# Patient Record
Sex: Male | Born: 1965 | Race: White | Hispanic: No | Smoking: Former smoker
Health system: Southern US, Community
[De-identification: ages and names within clinical notes are randomized; demographics above are authoritative.]

## PROBLEM LIST (undated history)

## (undated) DIAGNOSIS — N200 Calculus of kidney: Secondary | ICD-10-CM

## (undated) DIAGNOSIS — M109 Gout, unspecified: Secondary | ICD-10-CM

## (undated) DIAGNOSIS — K5792 Diverticulitis of intestine, part unspecified, without perforation or abscess without bleeding: Secondary | ICD-10-CM

## (undated) DIAGNOSIS — I2510 Atherosclerotic heart disease of native coronary artery without angina pectoris: Secondary | ICD-10-CM

## (undated) DIAGNOSIS — F419 Anxiety disorder, unspecified: Secondary | ICD-10-CM

## (undated) DIAGNOSIS — K922 Gastrointestinal hemorrhage, unspecified: Secondary | ICD-10-CM

## (undated) DIAGNOSIS — I10 Essential (primary) hypertension: Secondary | ICD-10-CM

## (undated) DIAGNOSIS — I219 Acute myocardial infarction, unspecified: Secondary | ICD-10-CM

## (undated) DIAGNOSIS — I429 Cardiomyopathy, unspecified: Secondary | ICD-10-CM

## (undated) DIAGNOSIS — D509 Iron deficiency anemia, unspecified: Secondary | ICD-10-CM

## (undated) DIAGNOSIS — E119 Type 2 diabetes mellitus without complications: Secondary | ICD-10-CM

## (undated) DIAGNOSIS — G629 Polyneuropathy, unspecified: Secondary | ICD-10-CM

## (undated) DIAGNOSIS — E785 Hyperlipidemia, unspecified: Secondary | ICD-10-CM

## (undated) HISTORY — PX: CARDIAC CATHETERIZATION: SHX172

## (undated) HISTORY — PX: KNEE ARTHROSCOPY: SHX127

## (undated) HISTORY — DX: Polyneuropathy, unspecified: G62.9

## (undated) HISTORY — PX: CYSTOSCOPY/RETROGRADE/URETEROSCOPY/STONE EXTRACTION WITH BASKET: SHX5317

## (undated) HISTORY — PX: TONSILLECTOMY: SUR1361

## (undated) HISTORY — PX: KNEE SURGERY: SHX244

---

## 2005-12-02 DIAGNOSIS — I219 Acute myocardial infarction, unspecified: Secondary | ICD-10-CM

## 2005-12-02 HISTORY — PX: CORONARY ARTERY BYPASS GRAFT: SHX141

## 2005-12-02 HISTORY — DX: Acute myocardial infarction, unspecified: I21.9

## 2007-12-03 HISTORY — PX: KNEE CARTILAGE SURGERY: SHX688

## 2011-12-03 DIAGNOSIS — K922 Gastrointestinal hemorrhage, unspecified: Secondary | ICD-10-CM

## 2011-12-03 DIAGNOSIS — E119 Type 2 diabetes mellitus without complications: Secondary | ICD-10-CM

## 2011-12-03 HISTORY — DX: Type 2 diabetes mellitus without complications: E11.9

## 2011-12-03 HISTORY — DX: Gastrointestinal hemorrhage, unspecified: K92.2

## 2012-05-20 DIAGNOSIS — Z951 Presence of aortocoronary bypass graft: Secondary | ICD-10-CM | POA: Insufficient documentation

## 2012-05-20 DIAGNOSIS — I10 Essential (primary) hypertension: Secondary | ICD-10-CM | POA: Insufficient documentation

## 2012-05-20 DIAGNOSIS — M109 Gout, unspecified: Secondary | ICD-10-CM | POA: Insufficient documentation

## 2012-05-20 DIAGNOSIS — I2510 Atherosclerotic heart disease of native coronary artery without angina pectoris: Secondary | ICD-10-CM | POA: Insufficient documentation

## 2012-05-21 ENCOUNTER — Encounter (HOSPITAL_COMMUNITY): Payer: Self-pay | Admitting: Family Medicine

## 2012-05-21 ENCOUNTER — Emergency Department (HOSPITAL_COMMUNITY)
Admission: EM | Admit: 2012-05-21 | Discharge: 2012-05-21 | Disposition: A | Discharge status: Home / Self Care | Payer: Self-pay | Attending: Emergency Medicine | Admitting: Emergency Medicine

## 2012-05-21 DIAGNOSIS — I10 Essential (primary) hypertension: Secondary | ICD-10-CM

## 2012-05-21 DIAGNOSIS — M109 Gout, unspecified: Secondary | ICD-10-CM

## 2012-05-21 HISTORY — DX: Atherosclerotic heart disease of native coronary artery without angina pectoris: I25.10

## 2012-05-21 LAB — POCT I-STAT, CHEM 8
BUN: 21 mg/dL (ref 6–23)
Calcium, Ion: 1.13 mmol/L (ref 1.12–1.32)
Chloride: 107 mEq/L (ref 96–112)
HCT: 47 % (ref 39.0–52.0)
Sodium: 139 mEq/L (ref 135–145)

## 2012-05-21 MED ORDER — HYDROCODONE-ACETAMINOPHEN 5-325 MG PO TABS: 1.0000 | ORAL_TABLET | Freq: Four times a day (QID) | ORAL | Status: DC | PRN

## 2012-05-21 MED ORDER — HYDROMORPHONE HCL PF 1 MG/ML IJ SOLN
1.0000 mg | Freq: Once | INTRAMUSCULAR | Status: AC
  Administered 2012-05-21: 1 mg via INTRAVENOUS
  Filled 2012-05-21: qty 1

## 2012-05-21 MED ORDER — ONDANSETRON HCL 4 MG/2ML IJ SOLN
4.0000 mg | Freq: Once | INTRAMUSCULAR | Status: AC
  Administered 2012-05-21: 4 mg via INTRAVENOUS
  Filled 2012-05-21: qty 2

## 2012-05-21 MED ORDER — INDOMETHACIN 25 MG PO CAPS
25.0000 mg | ORAL_CAPSULE | Freq: Three times a day (TID) | ORAL | Status: DC | PRN
Start: 2012-05-21 — End: 2012-08-19

## 2012-05-21 MED ORDER — LISINOPRIL 20 MG PO TABS
20.0000 mg | ORAL_TABLET | Freq: Every day | ORAL | Status: DC
  Administered 2012-05-21: 20 mg via ORAL
  Filled 2012-05-21: qty 1

## 2012-05-21 NOTE — Discharge Instructions (Signed)
Arterial Hypertension Arterial hypertension (high blood pressure) is a condition of elevated pressure in your blood vessels. Hypertension over a long period of time is a risk factor for strokes, heart attacks, and heart failure. It is also the leading cause of kidney (renal) failure.  CAUSES   In Adults -- Over 90% of all hypertension has no known cause. This is called essential or primary hypertension. In the other 10% of people with hypertension, the increase in blood pressure is caused by another disorder. This is called secondary hypertension. Important causes of secondary hypertension are:   Heavy alcohol use.   Obstructive sleep apnea.   Hyperaldosterosim (Conn's syndrome).   Steroid use.   Chronic kidney failure.   Hyperparathyroidism.   Medications.   Renal artery stenosis.   Pheochromocytoma.   Cushing's disease.   Coarctation of the aorta.   Scleroderma renal crisis.   Licorice (in excessive amounts).   Drugs (cocaine, methamphetamine).  Your caregiver can explain any items above that apply to you.  In Children -- Secondary hypertension is more common and should always be considered.   Pregnancy -- Few women of childbearing age have high blood pressure. However, up to 10% of them develop hypertension of pregnancy. Generally, this will not harm the woman. It Even be a sign of 3 complications of pregnancy: preeclampsia, HELLP syndrome, and eclampsia. Follow up and control with medication is necessary.  SYMPTOMS   This condition normally does not produce any noticeable symptoms. It is usually found during a routine exam.   Malignant hypertension is a late problem of high blood pressure. It Monger have the following symptoms:   Headaches.   Blurred vision.   End-organ damage (this means your kidneys, heart, lungs, and other organs are being damaged).   Stressful situations can increase the blood pressure. If a person with normal blood pressure has their blood  pressure go up while being seen by their caregiver, this is often termed "white coat hypertension." Its importance is not known. It Alkire be related with eventually developing hypertension or complications of hypertension.   Hypertension is often confused with mental tension, stress, and anxiety.  DIAGNOSIS  The diagnosis is made by 3 separate blood pressure measurements. They are taken at least 1 week apart from each other. If there is organ damage from hypertension, the diagnosis Lemire be made without repeat measurements. Hypertension is usually identified by having blood pressure readings:  Above 140/90 mmHg measured in both arms, at 3 separate times, over a couple weeks.   Over 130/80 mmHg should be considered a risk factor and Grisanti require treatment in patients with diabetes.  Blood pressure readings over 120/80 mmHg are called "pre-hypertension" even in non-diabetic patients. To get a true blood pressure measurement, use the following guidelines. Be aware of the factors that can alter blood pressure readings.  Take measurements at least 1 hour after caffeine.   Take measurements 30 minutes after smoking and without any stress. This is another reason to quit smoking - it raises your blood pressure.   Use a proper cuff size. Ask your caregiver if you are not sure about your cuff size.   Most home blood pressure cuffs are automatic. They will measure systolic and diastolic pressures. The systolic pressure is the pressure reading at the start of sounds. Diastolic pressure is the pressure at which the sounds disappear. If you are elderly, measure pressures in multiple postures. Try sitting, lying or standing.   Sit at rest for a minimum of   5 minutes before taking measurements.   You should not be on any medications like decongestants. These are found in many cold medications.   Record your blood pressure readings and review them with your caregiver.  If you have hypertension:  Your caregiver  may do tests to be sure you do not have secondary hypertension (see "causes" above).   Your caregiver may also look for signs of metabolic syndrome. This is also called Syndrome X or Insulin Resistance Syndrome. You may have this syndrome if you have type 2 diabetes, abdominal obesity, and abnormal blood lipids in addition to hypertension.   Your caregiver will take your medical and family history and perform a physical exam.   Diagnostic tests may include blood tests (for glucose, cholesterol, potassium, and kidney function), a urinalysis, or an EKG. Other tests may also be necessary depending on your condition.  PREVENTION  There are important lifestyle issues that you can adopt to reduce your chance of developing hypertension:  Maintain a normal weight.   Limit the amount of salt (sodium) in your diet.   Exercise often.   Limit alcohol intake.   Get enough potassium in your diet. Discuss specific advice with your caregiver.   Follow a DASH diet (dietary approaches to stop hypertension). This diet is rich in fruits, vegetables, and low-fat dairy products, and avoids certain fats.  PROGNOSIS  Essential hypertension cannot be cured. Lifestyle changes and medical treatment can lower blood pressure and reduce complications. The prognosis of secondary hypertension depends on the underlying cause. Many people whose hypertension is controlled with medicine or lifestyle changes can live a normal, healthy life.  RISKS AND COMPLICATIONS  While high blood pressure alone is not an illness, it often requires treatment due to its short- and long-term effects on many organs. Hypertension increases your risk for:  CVAs or strokes (cerebrovascular accident).   Heart failure due to chronically high blood pressure (hypertensive cardiomyopathy).   Heart attack (myocardial infarction).   Damage to the retina (hypertensive retinopathy).   Kidney failure (hypertensive nephropathy).  Your caregiver can  explain list items above that apply to you. Treatment of hypertension can significantly reduce the risk of complications. TREATMENT   For overweight patients, weight loss and regular exercise are recommended. Physical fitness lowers blood pressure.   Mild hypertension is usually treated with diet and exercise. A diet rich in fruits and vegetables, fat-free dairy products, and foods low in fat and salt (sodium) can help lower blood pressure. Decreasing salt intake decreases blood pressure in a 1/3 of people.   Stop smoking if you are a smoker.  The steps above are highly effective in reducing blood pressure. While these actions are easy to suggest, they are difficult to achieve. Most patients with moderate or severe hypertension end up requiring medications to bring their blood pressure down to a normal level. There are several classes of medications for treatment. Blood pressure pills (antihypertensives) will lower blood pressure by their different actions. Lowering the blood pressure by 10 mmHg may decrease the risk of complications by as much as 25%. The goal of treatment is effective blood pressure control. This will reduce your risk for complications. Your caregiver will help you determine the best treatment for you according to your lifestyle. What is excellent treatment for one person, may not be for you. HOME CARE INSTRUCTIONS   Do not smoke.   Follow the lifestyle changes outlined in the "Prevention" section.   If you are on medications, follow the directions   carefully. Blood pressure medications must be taken as prescribed. Skipping doses reduces their benefit. It also puts you at risk for problems.   Follow up with your caregiver, as directed.   If you are asked to monitor your blood pressure at home, follow the guidelines in the "Diagnosis" section above.  SEEK MEDICAL CARE IF:   You think you are having medication side effects.   You have recurrent headaches or lightheadedness.     You have swelling in your ankles.   You have trouble with your vision.  SEEK IMMEDIATE MEDICAL CARE IF:   You have sudden onset of chest pain or pressure, difficulty breathing, or other symptoms of a heart attack.   You have a severe headache.   You have symptoms of a stroke (such as sudden weakness, difficulty speaking, difficulty walking).  MAKE SURE YOU:   Understand these instructions.   Will watch your condition.   Will get help right away if you are not doing well or get worse.  Document Released: 11/18/2005 Document Revised: 11/07/2011 Document Reviewed: 06/18/2007 [facility] Patient Information 2012 [place], [place]. Gout Gout is an inflammatory condition (arthritis) caused by a buildup of uric acid crystals in the joints. Uric acid is a chemical that is normally present in the blood. Under some circumstances, uric acid can form into crystals in your joints. This causes joint redness, soreness, and swelling (inflammation). Repeat attacks are common. Over time, uric acid crystals can form into masses (tophi) near a joint, causing disfigurement. Gout is treatable and often preventable. CAUSES  The disease begins with elevated levels of uric acid in the blood. Uric acid is produced by your body when it breaks down a naturally found substance called purines. This also happens when you eat certain foods such as meats and fish. Causes of an elevated uric acid level include:  Being passed down from parent to child (heredity).   Diseases that cause increased uric acid production (obesity, psoriasis, some cancers).   Excessive alcohol use.   Diet, especially diets rich in meat and seafood.   Medicines, including certain cancer-fighting drugs (chemotherapy), diuretics, and aspirin.   Chronic kidney disease. The kidneys are no longer able to remove uric acid well.   Problems with metabolism.  Conditions strongly associated with gout include:  Obesity.   High blood pressure.    High cholesterol.   Diabetes.  Not everyone with elevated uric acid levels gets gout. It is not understood why some people get gout and others do not. Surgery, joint injury, and eating too much of certain foods are some of the factors that can lead to gout. SYMPTOMS   An attack of gout comes on quickly. It causes intense pain with redness, swelling, and warmth in a joint.   Fever can occur.   Often, only one joint is involved. Certain joints are more commonly involved:   Base of the big toe.   Knee.   Ankle.   Wrist.   Finger.  Without treatment, an attack usually goes away in a few days to weeks. Between attacks, you usually will not have symptoms, which is different from many other forms of arthritis. DIAGNOSIS  Your caregiver will suspect gout based on your symptoms and exam. Removal of fluid from the joint (arthrocentesis) is done to check for uric acid crystals. Your caregiver will give you a medicine that numbs the area (local anesthetic) and use a needle to remove joint fluid for exam. Gout is confirmed when uric acid crystals are  seen in joint fluid, using a special microscope. Sometimes, blood, urine, and X-ray tests are also used. TREATMENT  There are 2 phases to gout treatment: treating the sudden onset (acute) attack and preventing attacks (prophylaxis). Treatment of an Acute Attack  Medicines are used. These include anti-inflammatory medicines or steroid medicines.   An injection of steroid medicine into the affected joint is sometimes necessary.   The painful joint is rested. Movement can worsen the arthritis.   You may use warm or cold treatments on painful joints, depending which works best for you.   Discuss the use of coffee, vitamin C, or cherries with your caregiver. These may be helpful treatment options.  Treatment to Prevent Attacks After the acute attack subsides, your caregiver may advise prophylactic medicine. These medicines either help your  kidneys eliminate uric acid from your body or decrease your uric acid production. You may need to stay on these medicines for a very long time. The early phase of treatment with prophylactic medicine can be associated with an increase in acute gout attacks. For this reason, during the first few months of treatment, your caregiver may also advise you to take medicines usually used for acute gout treatment. Be sure you understand your caregiver's directions. You should also discuss dietary treatment with your caregiver. Certain foods such as meats and fish can increase uric acid levels. Other foods such as dairy can decrease levels. Your caregiver can give you a list of foods to avoid. HOME CARE INSTRUCTIONS   Do not take aspirin to relieve pain. This raises uric acid levels.   Only take over-the-counter or prescription medicines for pain, discomfort, or fever as directed by your caregiver.   Rest the joint as much as possible. When in bed, keep sheets and blankets off painful areas.   Keep the affected joint raised (elevated).   Use crutches if the painful joint is in your leg.   Drink enough water and fluids to keep your urine clear or pale yellow. This helps your body get rid of uric acid. Do not drink alcoholic beverages. They slow the passage of uric acid.   Follow your caregiver's dietary instructions. Pay careful attention to the amount of protein you eat. Your daily diet should emphasize fruits, vegetables, whole grains, and fat-free or low-fat milk products.   Maintain a healthy body weight.  SEEK MEDICAL CARE IF:   You have an oral temperature above 102 F (38.9 C).   You develop diarrhea, vomiting, or any side effects from medicines.   You do not feel better in 24 hours, or you are getting worse.  SEEK IMMEDIATE MEDICAL CARE IF:   Your joint becomes suddenly more tender and you have:   Chills.   An oral temperature above 102 F (38.9 C), not controlled by medicine.  MAKE  SURE YOU:   Understand these instructions.   Will watch your condition.   Will get help right away if you are not doing well or get worse.  Document Released: 11/15/2000 Document Revised: 11/07/2011 Document Reviewed: 02/26/2010 RESOURCE GUIDE  Chronic Pain Problems: Contact [facility]  [phone] Patients need to be referred by their primary care doctor.  Insufficient Money for Medicine: Contact United Way:  call "211" or [facility] [phone].  No Primary Care Doctor: - Call [facility]  [phone] - can help you locate a primary care doctor that  accepts your insurance, provides certain services, etc. - Physician Referral [phone]  Agencies that  provide inexpensive medical care: [facility]  [phone] - [facility]  [phone] - [facility]  [phone] [facility]  [phone] - [facility]  [phone] [facility]  [phone]  Medicaid-accepting [facility] Providers: - [facility]- [address]  [phone], Mon-Fri 9am-7pm, Sat 9am-1pm - [facility]- [address] [place], [address] [place]  [phone] - [facility]- [address] [place]  [phone] [facility]- [address]  [phone] - Renaye Rakers- [address] [place], [address], [phone]  Only accepts [place] [facility] [place] patients after they have their name  applied to their card  Self Pay (no insurance) in [facility]: - Sickle Cell Patients: Dr Willey Blade, [facility]  [address] [place], [phone] - [facility]- [address] [place]  [phone]       [facility] [place]- [address]       -     [facility]- see information above (Speak to [employer] if you do not have insurance)       -  [facility]- [address] [place], [phone]        -  [facility]- [address],  [phone]       -  [facility]- [address], [phone]       -  Dr Julio Sicks-  [address], [place], [phone]       -  [facility]- [address] [phone]       -  [facility]- [address] [phone], also [address], [phone]       -    [facility]- [address] [place], [phone], 1st & 3rd Saturday   every month, 10am-1pm  1) Find a Doctor and Pay Out of Pocket Although you won't have to find out who is covered by your insurance plan, it is a good idea to ask around and get recommendations. You will then need to call the office and see if the doctor you have chosen will accept you as a new patient and what types of options they offer for patients who are self-pay. Some doctors offer discounts or will set up payment plans for their patients who do not have insurance, but you will need to ask so you aren't surprised when you get to your appointment.  2) Contact Your Local Health Department Not all health departments have doctors that can see patients for sick visits, but many do, so it is worth a call to see if yours does. If you don't know where your local health department is, you can check in your phone book. The CDC also has a tool to help you locate your state's health department, and many state websites also have listings of all of their local health departments.  3) Find a Walk-in Clinic If your illness is not likely to be very severe or complicated, you may want to try a walk in clinic. These are popping up all over the country in pharmacies, drugstores, and shopping centers. They're usually staffed by nurse practitioners or physician assistants that have been trained to treat common illnesses and complaints. They're usually fairly quick and inexpensive. However, if you  have serious medical issues or chronic medical problems, these are probably not your  best option  STD Testing - [facility] Department of [facility], STD Clinic, [address] [place], phone [phone] or [phone].  Monday - Friday, call for an appointment. [facility] Department of [employer], STD Clinic, [address], [place], phone [phone] or [phone].  Monday - Friday, call for an appointment.  Abuse/Neglect: [facility] Child Abuse Hotline (480) 670-8794 [facility] Child Abuse Hotline 404-321-9664 (After Hours)  Emergency Shelter:  [facility] 912-300-2370  Maternity Homes: - [facility] at the [place] [facility] [phone] - [facility] (631)283-0367  MRSA Hotline #:   (410)063-6902  [facility] Resources  [facility] [place]  United Way [facility] Dept. [address]                 [address]         [address] [place] [address]  Blondell Reveal Phone:  [phone]                                  Phone:  [phone]                   Phone:  [phone]  [facility] Mental Health, [phone] - [phone]       -     [facility] in [place], [address],                                  [phone], [facility] Child Abuse Hotline 772-177-7376 or 9867650507 (After Hours)   Behavioral Health Services  Substance Abuse Resources: - [facility] Drug Services  [phone] - [facility] (909) 302-6058 - [facility] [place] (240)767-1520 Floydene Flock [phone] - Residential & Outpatient Substance Abuse Program  (801) [phone]  Psychological Services: [facility]  662-617-3921 Services  [phone] - [facility], (870) [phone] [place]. [address] [place], ACCESS LINE: 708-686-7099 or  (915)[phone], [employer]za  Dental Assistance  If unable to pay or uninsured, contact:  [facility] or [facility]. to become qualified for the adult dental clinic.  Patients with Medicaid: [facility] [phone] [address], [phone] [address] [phone]  If unable to pay, or uninsured, contact [facility] 228-001-5980) or [facility] Department [phone] in [place], [phone] in [facility]) to become qualified for the adult dental clinic  Other Low-Cost Community Dental Services: - [facility]- [address] [place], [place], [place], [address], [phone], [address]. [address], 2nd and 4th Thursday of the month at 6:30am.  10 clients each day by appointment, can sometimes see walk-in patients if someone does not show for an appointment. -  [facility]- [address] Ether Griffins [place], [place], [address], [phone] - [facility]- [address], [place], [place], [address], [phone] [facility]- [phone] [facility] Department- [phone] [facility] Health Department- [phone] [facility] Patient Information [address] [place].

## 2012-05-21 NOTE — ED Provider Notes (Signed)
History     CSN: [id number]  Arrival date & time 05/20/12  2329   First MD Initiated Contact with Patient 05/21/12 0211      Chief Complaint  Patient presents with  . Gout    HPI The patient presents to the emergency room with complaints of right foot pain that he associates with a gout attack. Patient states she's had this trouble in the past. He's been previously diagnosed with gout. Over the last few days he has had the onset of pain in his right foot primarily along the metatarsal phalangeal joint of the great toe as well as the adjacent toes. He has not had any fevers or trauma.  He has not had any numbness or weakness. He has not noticed any rash. Patient states when the pain is severe it is caused him to be nauseous and vomit. He states he cannot take his blood pressure medications today for that reason.  He denies any abdominal pain, chest pain or shortness of breath. Past Medical History  Diagnosis Date  . Coronary artery disease     Past Surgical History  Procedure Date  . Coronary artery bypass graft   . Knee surgery   . Renal exploration     No family history on file.  History  Substance Use Topics  . Smoking status: Former Games developer  . Smokeless tobacco: Not on file  . Alcohol Use: No      Review of Systems  All other systems reviewed and are negative.    Allergies  Review of patient's allergies indicates no known allergies.  Home Medications   Current Outpatient Rx  Name Route Sig Dispense Refill  . GLUCOSAMINE-CHONDROITIN 500-400 MG PO TABS Oral Take 1 tablet by mouth daily.    Marland Kitchen LISINOPRIL 20 MG PO TABS Oral Take 20 mg by mouth daily.    Marland Kitchen MELATONIN 3 MG PO CAPS Oral Take 1 capsule by mouth at bedtime as needed. For sleep    . ROSUVASTATIN CALCIUM 10 MG PO TABS Oral Take 10 mg by mouth daily.      BP 188/130  Pulse 99  Temp 98.3 F (36.8 C) (Oral)  Resp 22  SpO2 99%  Physical Exam  Nursing note and vitals reviewed. Constitutional: He  appears well-developed and well-nourished. No distress.  HENT:  Head: Normocephalic and atraumatic.  Right Ear: External ear normal.  Left Ear: External ear normal.  Eyes: Conjunctivae are normal. Right eye exhibits no discharge. Left eye exhibits no discharge. No scleral icterus.  Neck: Neck supple. No tracheal deviation present.  Cardiovascular: Normal rate, regular rhythm and intact distal pulses.   Pulmonary/Chest: Effort normal and breath sounds normal. No stridor. No respiratory distress. He has no wheezes. He has no rales.  Abdominal: Soft. Bowel sounds are normal. He exhibits no distension. There is no tenderness. There is no rebound and no guarding.  Musculoskeletal: He exhibits tenderness. He exhibits no edema.       Right foot: He exhibits decreased range of motion and tenderness. He exhibits no swelling, normal capillary refill, no crepitus and no deformity.       Feet:       Normal dorsalis pedis pulse in the right foot  Neurological: He is alert. He has normal strength. No sensory deficit. Cranial nerve deficit:  no gross defecits noted. He exhibits normal muscle tone. He displays no seizure activity. Coordination normal.  Skin: Skin is warm and dry. No rash noted.  Psychiatric: He has a  normal mood and affect.    ED Course  Procedures (including critical care time)  Labs Reviewed  POCT I-STAT, CHEM 8 - Abnormal; Notable for the following:    Glucose, Bld 169 (*)     All other components within normal limits   No results found.   1. Gout   2. Hypertension       MDM  Patient's blood pressure at the bedside was 140/98. It is improved after taking his blood pressure medications and getting some pain relief.  Symptoms are consistent with a gout attack.  No sign of acute infection or vascular insufficiency.  Pt will be discharged home on pain medications and indocin which he has taken in the past with good results.        Celene Kras, MD 05/21/12 [phone]

## 2012-05-21 NOTE — ED Notes (Addendum)
Patient states "I think I have gout." States he has right foot pain since Sunday that has gotten worse. States pain is so bad that he has vomited. Has not been able to keep blood pressure meds down.

## 2012-07-18 ENCOUNTER — Encounter (HOSPITAL_COMMUNITY): Payer: Self-pay | Admitting: Emergency Medicine

## 2012-07-18 ENCOUNTER — Emergency Department (HOSPITAL_COMMUNITY)
Admission: EM | Admit: 2012-07-18 | Discharge: 2012-07-18 | Disposition: A | Discharge status: Home / Self Care | Payer: Self-pay | Attending: Emergency Medicine | Admitting: Emergency Medicine

## 2012-07-18 DIAGNOSIS — Z951 Presence of aortocoronary bypass graft: Secondary | ICD-10-CM | POA: Insufficient documentation

## 2012-07-18 DIAGNOSIS — E119 Type 2 diabetes mellitus without complications: Secondary | ICD-10-CM | POA: Insufficient documentation

## 2012-07-18 DIAGNOSIS — I10 Essential (primary) hypertension: Secondary | ICD-10-CM | POA: Insufficient documentation

## 2012-07-18 DIAGNOSIS — C000 Malignant neoplasm of external upper lip: Secondary | ICD-10-CM | POA: Insufficient documentation

## 2012-07-18 DIAGNOSIS — M109 Gout, unspecified: Secondary | ICD-10-CM | POA: Insufficient documentation

## 2012-07-18 HISTORY — DX: Essential (primary) hypertension: I10

## 2012-07-18 HISTORY — DX: Gout, unspecified: M10.9

## 2012-07-18 HISTORY — DX: Diverticulitis of intestine, part unspecified, without perforation or abscess without bleeding: K57.92

## 2012-07-18 LAB — POCT I-STAT TROPONIN I: Troponin i, poc: 0.04 ng/mL (ref 0.00–0.08)

## 2012-07-18 LAB — POCT I-STAT, CHEM 8
BUN: 15 mg/dL (ref 6–23)
Creatinine, Ser: 1.1 mg/dL (ref 0.50–1.35)
Hemoglobin: 17 g/dL (ref 13.0–17.0)
Potassium: 3.8 mEq/L (ref 3.5–5.1)
Sodium: 141 mEq/L (ref 135–145)
TCO2: 21 mmol/L (ref 0–100)

## 2012-07-18 LAB — GLUCOSE, CAPILLARY: Glucose-Capillary: 152 mg/dL — ABNORMAL HIGH (ref 70–99)

## 2012-07-18 MED ORDER — MORPHINE SULFATE 4 MG/ML IJ SOLN
2.0000 mg | Freq: Once | INTRAMUSCULAR | Status: AC
  Administered 2012-07-18: 2 mg via INTRAVENOUS
  Filled 2012-07-18: qty 1

## 2012-07-18 MED ORDER — ONDANSETRON HCL 4 MG/2ML IJ SOLN
4.0000 mg | Freq: Once | INTRAMUSCULAR | Status: AC
  Administered 2012-07-18: 4 mg via INTRAVENOUS
  Filled 2012-07-18: qty 2

## 2012-07-18 MED ORDER — HYDROCODONE-IBUPROFEN 7.5-200 MG PO TABS: 1.0000 | ORAL_TABLET | Freq: Four times a day (QID) | ORAL | Status: AC | PRN

## 2012-07-18 MED ORDER — PREDNISONE 20 MG PO TABS: 40.0000 mg | ORAL_TABLET | Freq: Every day | ORAL | Status: AC

## 2012-07-18 MED ORDER — SODIUM CHLORIDE 0.9 % IV BOLUS (SEPSIS)
500.0000 mL | INTRAVENOUS | Status: AC
  Administered 2012-07-18: 500 mL via INTRAVENOUS

## 2012-07-18 MED ORDER — COLCHICINE 0.6 MG PO TABS: 0.6000 mg | ORAL_TABLET | Freq: Two times a day (BID) | ORAL | Status: DC

## 2012-07-18 MED ORDER — MORPHINE SULFATE 4 MG/ML IJ SOLN
4.0000 mg | Freq: Once | INTRAMUSCULAR | Status: AC
  Administered 2012-07-18: 4 mg via INTRAVENOUS
  Filled 2012-07-18: qty 1

## 2012-07-18 MED ORDER — LABETALOL HCL 5 MG/ML IV SOLN
20.0000 mg | Freq: Once | INTRAVENOUS | Status: AC
  Administered 2012-07-18: 20 mg via INTRAVENOUS
  Filled 2012-07-18: qty 4

## 2012-07-18 NOTE — ED Notes (Signed)
5mg labetalol given

## 2012-07-18 NOTE — ED Notes (Signed)
Pt presents w/ painful left foot onset 10 days ago. Saw PCP 10 days ago Rx'ed w/ Prednisone and Allopurinol which he has not started. Swelling and pain is getting worse. Endorses hx of gout in right foot but not usually the left.

## 2012-07-18 NOTE — ED Provider Notes (Signed)
History     CSN: [id number]  Arrival date & time 07/18/12  1324   First MD Initiated Contact with Patient 07/18/12 1439      Chief Complaint  Patient presents with  . Foot Pain    (Consider location/radiation/quality/duration/timing/severity/associated sxs/prior treatment) The history is provided by the patient and medical records.   Jose Petersen is a 46 y.o. male presents to the emergency department complaining of L foot pain.  The onset of the symptoms was  gradual starting 12 days ago.  The patient has associated nausea 2/2 to the pain.  The symptoms have been  persistent, gradually worsened.  walking makes the symptoms worse and vicoden makes symptoms better.  The patient denies fever, chills, headache, rash, chest pain, shortness of breath, abdominal pain, lesions on the foot or legs, trauma to the foot/ankle.   He believes these symptoms are related to a gout attack.  He was seen by Dr Sharol Roussel in [place] 10 days ago and given a prednisone dose pack, vicoden and allopurinol.  States the swelling and pain are continuing and he is struggling with work.  He has a Hx of gout.   The patient has medical history significant for:  Past Medical History  Diagnosis Date  . Coronary artery disease   . Hypertension   . Gout   . Diverticulitis   . Diabetes mellitus   . Kidney calculus     Past Surgical History  Procedure Date  . Coronary artery bypass graft   . Knee surgery   . Renal exploration     No family history on file.  History  Substance Use Topics  . Smoking status: Former Games developer  . Smokeless tobacco: Never Used  . Alcohol Use: No     Review of Systems  Constitutional: Positive for appetite change (2/2 pain and nausea). Negative for fever and fatigue.  Respiratory: Negative for chest tightness, shortness of breath and wheezing.   Cardiovascular: Negative for chest pain.  Gastrointestinal: Positive for nausea. Negative for vomiting, abdominal pain, diarrhea and  constipation.  Genitourinary: Negative for dysuria, urgency, frequency and hematuria.  Musculoskeletal: Positive for joint swelling and gait problem (2/2 pain). Negative for back pain.  Skin: Negative for rash and wound.  Neurological: Negative for headaches.    Allergies  Review of patient's allergies indicates no known allergies.  Home Medications   Current Outpatient Rx  Name Route Sig Dispense Refill  . GLUCOSAMINE-CHONDROITIN 500-400 MG PO TABS Oral Take 1 tablet by mouth daily.    Marland Kitchen LISINOPRIL 20 MG PO TABS Oral Take 20 mg by mouth daily.    Marland Kitchen MELATONIN 3 MG PO CAPS Oral Take 1 capsule by mouth at bedtime as needed. For sleep    . ROSUVASTATIN CALCIUM 10 MG PO TABS Oral Take 10 mg by mouth daily.      BP 194/130  Pulse 88  Temp 98 F (36.7 C) (Oral)  Resp 22  Ht 5\' 9"  (1.753 m)  Wt 238 lb (107.956 kg)  BMI 35.15 kg/m2  SpO2 97%  Physical Exam  Nursing note and vitals reviewed. Constitutional: He appears well-developed and well-nourished. No distress.  HENT:  Head: Normocephalic and atraumatic.  Eyes: Conjunctivae are normal.  Neck: Normal range of motion.  Cardiovascular: Normal rate, regular rhythm, normal heart sounds and intact distal pulses.  Exam reveals no gallop and no friction rub.   No murmur heard.      Capillary refill < 3 sec   Pulmonary/Chest: Effort normal  and breath sounds normal. No respiratory distress. He has no wheezes.  Abdominal: Soft. Bowel sounds are normal. He exhibits no distension. There is no tenderness. There is no rebound.  Musculoskeletal: Normal range of motion. He exhibits tenderness (L ankle, great toe and 2nd toe). He exhibits no edema.       Left ankle: He exhibits swelling. He exhibits no ecchymosis, no deformity, no laceration and normal pulse. Decreased range of motion: 2/2 pain. tenderness.       Feet:       ROM: full active and passive ROM Gait disturbance 2/2 pain in ankle  Neurological: He is alert. Coordination normal.         Sensation normal Strength decreased 2/2 pain  Skin: Skin is warm and dry. He is not diaphoretic.    ED Course  Procedures (including critical care time)  Labs Reviewed  POCT I-STAT, CHEM 8 - Abnormal; Notable for the following:    Glucose, Bld 185 (*)     All other components within normal limits  GLUCOSE, CAPILLARY - Abnormal; Notable for the following:    Glucose-Capillary 152 (*)     All other components within normal limits   No results found. Results for orders placed during the hospital encounter of 07/18/12  POCT I-STAT, CHEM 8      Component Value Range   Sodium 141  135 - 145 mEq/L   Potassium 3.8  3.5 - 5.1 mEq/L   Chloride 107  96 - 112 mEq/L   BUN 15  6 - 23 mg/dL   Creatinine, Ser 2.44  0.50 - 1.35 mg/dL   Glucose, Bld 010 (*) 70 - 99 mg/dL   Calcium, Ion 2.72  5.36 - 1.23 mmol/L   TCO2 21  0 - 100 mmol/L   Hemoglobin 17.0  13.0 - 17.0 g/dL   HCT 64.4  03.4 - 74.2 %  GLUCOSE, CAPILLARY      Component Value Range   Glucose-Capillary 152 (*) 70 - 99 mg/dL   No results found.  ECG:  Date: 07/18/2012  Rate: 93   Rhythm: normal sinus rhythm  QRS Axis: normal  Intervals: QT prolonged  ST/T Wave abnormalities: normal  Conduction Disutrbances:none  Narrative Interpretation: sinus rhythm with slight QT prolongation, no old ECG  Old EKG Reviewed: none available   1. HTN (hypertension)   2. Gout attack     MDM  [name] presents with L ankle pain and swelling.  Healthy, afebrile, nontoxic appearing male.  Likely continued gout flare.  I will check chem 8 and glucose, give pain and antiemetic medications and re-evaluate.  Chem 8 unremarkable except for an elevated glucose.  After pain management, pt remains hypertensive, but asymptomatic.  I will obtain an ECG, troponin.  I will also give labetalol and re-eval after .  BP re-eval after labetalol 164/119.  Pt feeling much better.  States BP will continue to come down after he takes his home  medication.  I will treat gout with colchicine and prednisone as he currently has no renal failure.  We have discussed side effects of the drug and he is amenable to discharge.  I have also discussed reasons to return immediately to the ER.  Patient states understanding.    1. Medications: colchicine and prednisone 2. Treatment: rest, take medications as prescribed 3. Follow Up: with primary care physician or ED if symptoms persist.            Dierdre Forth, PA-C 07/18/12 1849

## 2012-07-19 NOTE — ED Provider Notes (Signed)
Medical screening examination/treatment/procedure(s) were performed by non-physician practitioner and as supervising physician I was immediately available for consultation/collaboration.  Derwood Kaplan, MD 07/19/12 [phone]

## 2012-08-27 ENCOUNTER — Encounter (HOSPITAL_COMMUNITY): Payer: Self-pay | Admitting: Emergency Medicine

## 2012-08-27 ENCOUNTER — Inpatient Hospital Stay (HOSPITAL_COMMUNITY)
Admission: EM | Admit: 2012-08-27 | Discharge: 2012-08-31 | DRG: 811 | Disposition: A | Discharge status: Home / Self Care | Payer: MEDICAID | Attending: Internal Medicine | Admitting: Internal Medicine

## 2012-08-27 ENCOUNTER — Emergency Department (HOSPITAL_COMMUNITY): Payer: Self-pay

## 2012-08-27 DIAGNOSIS — K922 Gastrointestinal hemorrhage, unspecified: Secondary | ICD-10-CM

## 2012-08-27 DIAGNOSIS — D649 Anemia, unspecified: Secondary | ICD-10-CM

## 2012-08-27 DIAGNOSIS — Z6836 Body mass index (BMI) 36.0-36.9, adult: Secondary | ICD-10-CM

## 2012-08-27 DIAGNOSIS — E785 Hyperlipidemia, unspecified: Secondary | ICD-10-CM | POA: Diagnosis present

## 2012-08-27 DIAGNOSIS — I43 Cardiomyopathy in diseases classified elsewhere: Secondary | ICD-10-CM | POA: Diagnosis present

## 2012-08-27 DIAGNOSIS — I2510 Atherosclerotic heart disease of native coronary artery without angina pectoris: Secondary | ICD-10-CM | POA: Diagnosis present

## 2012-08-27 DIAGNOSIS — R7989 Other specified abnormal findings of blood chemistry: Secondary | ICD-10-CM

## 2012-08-27 DIAGNOSIS — D62 Acute posthemorrhagic anemia: Principal | ICD-10-CM

## 2012-08-27 DIAGNOSIS — E119 Type 2 diabetes mellitus without complications: Secondary | ICD-10-CM

## 2012-08-27 DIAGNOSIS — K625 Hemorrhage of anus and rectum: Secondary | ICD-10-CM

## 2012-08-27 DIAGNOSIS — R6889 Other general symptoms and signs: Secondary | ICD-10-CM | POA: Diagnosis present

## 2012-08-27 DIAGNOSIS — E1151 Type 2 diabetes mellitus with diabetic peripheral angiopathy without gangrene: Secondary | ICD-10-CM | POA: Diagnosis present

## 2012-08-27 DIAGNOSIS — K264 Chronic or unspecified duodenal ulcer with hemorrhage: Secondary | ICD-10-CM | POA: Diagnosis present

## 2012-08-27 DIAGNOSIS — R4182 Altered mental status, unspecified: Secondary | ICD-10-CM

## 2012-08-27 DIAGNOSIS — Z951 Presence of aortocoronary bypass graft: Secondary | ICD-10-CM

## 2012-08-27 DIAGNOSIS — D72829 Elevated white blood cell count, unspecified: Secondary | ICD-10-CM

## 2012-08-27 DIAGNOSIS — Z79899 Other long term (current) drug therapy: Secondary | ICD-10-CM

## 2012-08-27 DIAGNOSIS — I119 Hypertensive heart disease without heart failure: Secondary | ICD-10-CM | POA: Diagnosis present

## 2012-08-27 DIAGNOSIS — I214 Non-ST elevation (NSTEMI) myocardial infarction: Secondary | ICD-10-CM

## 2012-08-27 DIAGNOSIS — G9340 Encephalopathy, unspecified: Secondary | ICD-10-CM

## 2012-08-27 LAB — RAPID URINE DRUG SCREEN, HOSP PERFORMED
Barbiturates: NOT DETECTED
Tetrahydrocannabinol: NOT DETECTED

## 2012-08-27 LAB — COMPREHENSIVE METABOLIC PANEL
ALT: 13 U/L (ref 0–53)
AST: 13 U/L (ref 0–37)
Alkaline Phosphatase: 72 U/L (ref 39–117)
CO2: 21 mEq/L (ref 19–32)
Chloride: 103 mEq/L (ref 96–112)
Creatinine, Ser: 1.08 mg/dL (ref 0.50–1.35)
GFR calc non Af Amer: 79 mL/min — ABNORMAL LOW (ref >90)
Potassium: 4.7 mEq/L (ref 3.5–5.1)
Sodium: 138 mEq/L (ref 135–145)
Total Bilirubin: 0.3 mg/dL (ref 0.3–1.2)

## 2012-08-27 LAB — CBC WITH DIFFERENTIAL/PLATELET
Basophils Absolute: 0 10*3/uL (ref 0.0–0.1)
HCT: 15.7 % — ABNORMAL LOW (ref 39.0–52.0)
Lymphocytes Relative: 8 % — ABNORMAL LOW (ref 12–46)
Monocytes Absolute: 0.4 10*3/uL (ref 0.1–1.0)
Neutro Abs: 12.3 10*3/uL — ABNORMAL HIGH (ref 1.7–7.7)
RDW: 14.7 % (ref 11.5–15.5)
WBC: 13.8 10*3/uL — ABNORMAL HIGH (ref 4.0–10.5)

## 2012-08-27 LAB — PREPARE RBC (CROSSMATCH)

## 2012-08-27 LAB — URINALYSIS, ROUTINE W REFLEX MICROSCOPIC
Bilirubin Urine: NEGATIVE
Hgb urine dipstick: NEGATIVE
Ketones, ur: NEGATIVE mg/dL
Protein, ur: NEGATIVE mg/dL
Urobilinogen, UA: 0.2 mg/dL (ref 0.0–1.0)

## 2012-08-27 LAB — BLOOD GAS, ARTERIAL
Acid-base deficit: 2.9 mmol/L — ABNORMAL HIGH (ref 0.0–2.0)
Bicarbonate: 20.1 mEq/L (ref 20.0–24.0)
O2 Saturation: 98.3 %
Patient temperature: 98.6
TCO2: 19.1 mmol/L (ref 0–100)
pH, Arterial: 7.446 (ref 7.350–7.450)

## 2012-08-27 LAB — TROPONIN I: Troponin I: 0.32 ng/mL (ref <0.30)

## 2012-08-27 LAB — POCT I-STAT, CHEM 8
BUN: 36 mg/dL — ABNORMAL HIGH (ref 6–23)
Calcium, Ion: 1.14 mmol/L (ref 1.12–1.23)
HCT: 15 % — ABNORMAL LOW (ref 39.0–52.0)
Hemoglobin: 5.1 g/dL — CL (ref 13.0–17.0)
Sodium: 139 mEq/L (ref 135–145)
TCO2: 20 mmol/L (ref 0–100)

## 2012-08-27 MED ORDER — SODIUM CHLORIDE 0.9 % IJ SOLN
3.0000 mL | Freq: Two times a day (BID) | INTRAMUSCULAR | Status: DC
  Administered 2012-08-27 – 2012-08-30 (×6): 3 mL via INTRAVENOUS

## 2012-08-27 MED ORDER — ALBUTEROL SULFATE (5 MG/ML) 0.5% IN NEBU: 2.5000 mg | INHALATION_SOLUTION | Freq: Four times a day (QID) | RESPIRATORY_TRACT | Status: DC | PRN

## 2012-08-27 MED ORDER — COLCHICINE 0.6 MG PO TABS: 0.6000 mg | ORAL_TABLET | Freq: Two times a day (BID) | ORAL | Status: DC

## 2012-08-27 MED ORDER — SODIUM CHLORIDE 0.9 % IV SOLN
8.0000 mg/h | INTRAVENOUS | Status: DC
  Administered 2012-08-27 – 2012-08-30 (×5): 8 mg/h via INTRAVENOUS
  Filled 2012-08-27 (×12): qty 80

## 2012-08-27 MED ORDER — ATORVASTATIN CALCIUM 20 MG PO TABS
20.0000 mg | ORAL_TABLET | Freq: Every day | ORAL | Status: DC
  Administered 2012-08-28 – 2012-08-30 (×3): 20 mg via ORAL
  Filled 2012-08-27 (×4): qty 1

## 2012-08-27 MED ORDER — ONDANSETRON HCL 4 MG PO TABS: 4.0000 mg | ORAL_TABLET | Freq: Four times a day (QID) | ORAL | Status: DC | PRN

## 2012-08-27 MED ORDER — HYDROCODONE-ACETAMINOPHEN 5-325 MG PO TABS
1.0000 | ORAL_TABLET | ORAL | Status: DC | PRN
  Administered 2012-08-28 (×3): 1 via ORAL
  Administered 2012-08-29 (×2): 2 via ORAL
  Administered 2012-08-29 (×2): 1 via ORAL
  Administered 2012-08-30 – 2012-08-31 (×9): 2 via ORAL
  Filled 2012-08-27 (×2): qty 2
  Filled 2012-08-27 (×2): qty 1
  Filled 2012-08-27 (×7): qty 2
  Filled 2012-08-27: qty 1
  Filled 2012-08-27 (×2): qty 2
  Filled 2012-08-27 (×2): qty 1

## 2012-08-27 MED ORDER — ONDANSETRON HCL 4 MG/2ML IJ SOLN: 4.0000 mg | Freq: Four times a day (QID) | INTRAMUSCULAR | Status: DC | PRN

## 2012-08-27 MED ORDER — SODIUM CHLORIDE 0.9 % IV SOLN
INTRAVENOUS | Status: DC
  Administered 2012-08-28 – 2012-08-30 (×4): via INTRAVENOUS
  Administered 2012-08-31: 20 mL/h via INTRAVENOUS

## 2012-08-27 MED ORDER — SODIUM CHLORIDE 0.9 % IV SOLN
80.0000 mg | Freq: Once | INTRAVENOUS | Status: AC
  Administered 2012-08-27: 80 mg via INTRAVENOUS
  Filled 2012-08-27: qty 80

## 2012-08-27 MED ORDER — ACETAMINOPHEN 650 MG RE SUPP: 650.0000 mg | Freq: Four times a day (QID) | RECTAL | Status: DC | PRN

## 2012-08-27 MED ORDER — ACETAMINOPHEN 325 MG PO TABS: 650.0000 mg | ORAL_TABLET | Freq: Four times a day (QID) | ORAL | Status: DC | PRN

## 2012-08-27 MED ORDER — SODIUM CHLORIDE 0.9 % IV BOLUS (SEPSIS)
1000.0000 mL | Freq: Once | INTRAVENOUS | Status: AC
  Administered 2012-08-27: 1000 mL via INTRAVENOUS

## 2012-08-27 NOTE — ED Provider Notes (Signed)
History     CSN: [id number]  Arrival date & time 08/27/12  1614   First MD Initiated Contact with Patient 08/27/12 1642      Chief Complaint  Patient presents with  . GI Bleeding   Level LV caveat for altered mental status  (Consider location/radiation/quality/duration/timing/severity/associated sxs/prior treatment) HPI  Pt presents via EMS, pt was found in his bathroom laying on the floor after passing blood clots. Per EMS he saw his doctor yesterday and had + hemoccult. Pt denies abdominal pain. Pt is very sleepy, is slow in mentation and cannot answer most questions.  PCP unknown.   Past Medical History  Diagnosis Date  . Coronary artery disease   . Hypertension   . Gout   . Diverticulitis   . Diabetes mellitus   . Kidney calculus     Past Surgical History  Procedure Date  . Coronary artery bypass graft   . Knee surgery   . Renal exploration     History reviewed. No pertinent family history.  History  Substance Use Topics  . Smoking status: Former Games developer  . Smokeless tobacco: Never Used  . Alcohol Use: No   Lives at home Lives with mother   Review of Systems  Unable to perform ROS: Mental status change    Allergies  Review of patient's allergies indicates no known allergies.  Home Medications   Current Outpatient Rx  Name Route Sig Dispense Refill  . COLCHICINE 0.6 MG PO TABS Oral Take 1 tablet (0.6 mg total) by mouth 2 (two) times daily. 8 tablet 0  . GLUCOSAMINE-CHONDROITIN 500-400 MG PO TABS Oral Take 1 tablet by mouth daily.    Marland Kitchen LISINOPRIL 20 MG PO TABS Oral Take 20 mg by mouth daily.    Marland Kitchen MELATONIN 3 MG PO CAPS Oral Take 1 capsule by mouth at bedtime as needed. For sleep    . ROSUVASTATIN CALCIUM 10 MG PO TABS Oral Take 10 mg by mouth daily.      BP 110/80  Pulse 88  Temp 98.6 F (37 C) (Oral)  Resp 28  SpO2 98%  Vital signs normal    Physical Exam  Nursing note and vitals reviewed. Constitutional: He appears well-developed  and well-nourished.  Non-toxic appearance. He does not appear ill. No distress.  HENT:  Head: Normocephalic and atraumatic.  Right Ear: External ear normal.  Left Ear: External ear normal.  Nose: Nose normal. No mucosal edema or rhinorrhea.  Mouth/Throat: Mucous membranes are normal. No dental abscesses or uvula swelling.       MM dry  Eyes: Conjunctivae normal and EOM are normal. Pupils are equal, round, and reactive to light.       Scleral pale  Neck: Normal range of motion and full passive range of motion without pain. Neck supple.  Cardiovascular: Normal rate, regular rhythm and normal heart sounds.  Exam reveals no gallop and no friction rub.   No murmur heard. Pulmonary/Chest: Effort normal and breath sounds normal. No respiratory distress. He has no wheezes. He has no rhonchi. He has no rales. He exhibits no tenderness and no crepitus.  Abdominal: Soft. Normal appearance and bowel sounds are normal. He exhibits no distension. There is no tenderness. There is no rebound and no guarding.  Genitourinary:       Rectal exam reveals large amount of dark red blood that is Hemoccult positive  Musculoskeletal: Normal range of motion. He exhibits no edema and no tenderness.       Moves  all extremities well.   Neurological: He has normal strength. No cranial nerve deficit.       Sleepy, easily awakened, knows his birthdate but cannot tell me who is doctor is  Skin: Skin is warm, dry and intact. No rash noted. No erythema. There is pallor.  Psychiatric: He has a normal mood and affect. His speech is normal and behavior is normal. His mood appears not anxious.    ED Course  Procedures (including critical care time)  Patient given IV fluid bolus, he was given typed specific blood. His vital signs were stable without tachycardia or hypotension. Chest x-ray was done to rule out aspiration. EKG was abnormal and troponin was added to his blood work. Patient does have a history of coronary artery  disease.  17:41 Dr Dulce Sellar aware of patient, wants him to be resuscitated with blood and fluids and will probably do procedure in am unless his condition changes.   17:57 Dr Blaine Hamper suggests LA, ABG and he will talk to the hospitalist and one of them will admit. MOP in room now, states he fell this morning and was confused. She found him later in the bathroom with dark blood on the floor. Pt denies vomiting blood, just passing rectally. 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 Pt also had a MI a couple of years ago in [place] and had CABG done, does not have a local cardiologist. PT was seen yesterday by Dr Ivory Broad b/o passing blood twice per rectum.  On monitor HR is 108, BP is 130 systolic, but appears to have some ST depression. Pt getting his first unit of blood now.   Pt checked several times, he did develop a mild tachycardia but never became hypotensive. After his first unit of blood he seemed more awake and aware of his surrounding.   Results for orders placed during the hospital encounter of 08/27/12  CBC WITH DIFFERENTIAL      Component Value Range   WBC 13.8 (*) 4.0 - 10.5 K/uL   RBC 1.75 (*) 4.22 - 5.81 MIL/uL   Hemoglobin 5.3 (*) 13.0 - 17.0 g/dL   HCT 09.8 (*) 11.9 - 14.7 %   MCV 89.7  78.0 - 100.0 fL   MCH 30.3  26.0 - 34.0 pg   MCHC 33.8  30.0 - 36.0 g/dL   RDW 82.9  56.2 - 13.0 %   Platelets 341  150 - 400 K/uL   Neutrophils Relative 89 (*) 43 - 77 %   Neutro Abs 12.3 (*) 1.7 - 7.7 K/uL   Lymphocytes Relative 8 (*) 12 - 46 %   Lymphs Abs 1.0  0.7 - 4.0 K/uL   Monocytes Relative 3  3 - 12 %   Monocytes Absolute 0.4  0.1 - 1.0 K/uL   Eosinophils Relative 0  0 - 5 %   Eosinophils Absolute 0.0  0.0 - 0.7 K/uL   Basophils Relative 0  0 - 1 %   Basophils Absolute 0.0  0.0 - 0.1 K/uL  COMPREHENSIVE METABOLIC PANEL      Component Value Range   Sodium 138  135 - 145 mEq/L   Potassium 4.7  3.5 - 5.1  mEq/L   Chloride 103  96 - 112 mEq/L   CO2 21  19 - 32 mEq/L   Glucose, Bld 258 (*) 70 - 99 mg/dL   BUN 33 (*) 6 - 23 mg/dL  Creatinine, Ser 1.08  0.50 - 1.35 mg/dL   Calcium 8.8  8.4 - 45.4 mg/dL   Total Protein 5.8 (*) 6.0 - 8.3 g/dL   Albumin 3.1 (*) 3.5 - 5.2 g/dL   AST 13  0 - 37 U/L   ALT 13  0 - 53 U/L   Alkaline Phosphatase 72  39 - 117 U/L   Total Bilirubin 0.3  0.3 - 1.2 mg/dL   GFR calc non Af Amer 79 (*) >90 mL/min   GFR calc Af Amer >90  >90 mL/min  URINALYSIS, ROUTINE W REFLEX MICROSCOPIC      Component Value Range   Color, Urine YELLOW  YELLOW   APPearance CLEAR  CLEAR   Specific Gravity, Urine 1.017  1.005 - 1.030   pH 7.5  5.0 - 8.0   Glucose, UA NEGATIVE  NEGATIVE mg/dL   Hgb urine dipstick NEGATIVE  NEGATIVE   Bilirubin Urine NEGATIVE  NEGATIVE   Ketones, ur NEGATIVE  NEGATIVE mg/dL   Protein, ur NEGATIVE  NEGATIVE mg/dL   Urobilinogen, UA 0.2  0.0 - 1.0 mg/dL   Nitrite NEGATIVE  NEGATIVE   Leukocytes, UA NEGATIVE  NEGATIVE  APTT      Component Value Range   aPTT 31  24 - 37 seconds  PROTIME-INR      Component Value Range   Prothrombin Time 14.0  11.6 - 15.2 seconds   INR 1.09  0.00 - 1.49  PREPARE RBC (CROSSMATCH)      Component Value Range   Order Confirmation ORDER PROCESSED BY BLOOD BANK    TYPE AND SCREEN      Component Value Range   ABO/RH(D) AB POS     Antibody Screen NEG     Sample Expiration 08/30/2012     Unit Number [id number]     Blood Component Type RED CELLS,LR     Unit division 00     Status of Unit ISSUED     Transfusion Status OK TO TRANSFUSE     Crossmatch Result Compatible     Unit Number [id number]     Blood Component Type RED CELLS,LR     Unit division 00     Status of Unit ALLOCATED     Transfusion Status OK TO TRANSFUSE     Crossmatch Result Compatible     Unit Number [id number]     Blood Component Type RED CELLS,LR     Unit division 00     Status of Unit ISSUED     Transfusion Status OK TO TRANSFUSE      Crossmatch Result Compatible    TROPONIN I      Component Value Range   Troponin I 0.32 (*) <0.30 ng/mL  POCT I-STAT, CHEM 8      Component Value Range   Sodium 139  135 - 145 mEq/L   Potassium 4.6  3.5 - 5.1 mEq/L   Chloride 108  96 - 112 mEq/L   BUN 36 (*) 6 - 23 mg/dL   Creatinine, Ser 1.02  0.50 - 1.35 mg/dL   Glucose, Bld 725 (*) 70 - 99 mg/dL   Calcium, Ion 3.66  4.40 - 1.23 mmol/L   TCO2 20  0 - 100 mmol/L   Hemoglobin 5.1 (*) 13.0 - 17.0 g/dL   HCT 34.7 (*) 42.5 - 95.6 %   Comment NOTIFIED PHYSICIAN    PROCALCITONIN      Component Value Range   Procalcitonin 0.24    BLOOD GAS, ARTERIAL  Component Value Range   FIO2 0.21     pH, Arterial 7.446  7.350 - 7.450   pCO2 arterial 29.7 (*) 35.0 - 45.0 mmHg   pO2, Arterial 84.8  80.0 - 100.0 mmHg   Bicarbonate 20.1  20.0 - 24.0 mEq/L   TCO2 19.1  0 - 100 mmol/L   Acid-base deficit 2.9 (*) 0.0 - 2.0 mmol/L   O2 Saturation 98.3     Patient temperature 98.6     Collection site RIGHT RADIAL     Drawn by [id number]     Sample type ARTERIAL DRAW     Allens test (pass/fail) PASS  PASS  ABO/RH      Component Value Range   ABO/RH(D) AB POS    URINE RAPID DRUG SCREEN (HOSP PERFORMED)      Component Value Range   Opiates NONE DETECTED  NONE DETECTED   Cocaine NONE DETECTED  NONE DETECTED   Benzodiazepines NONE DETECTED  NONE DETECTED   Amphetamines NONE DETECTED  NONE DETECTED   Tetrahydrocannabinol NONE DETECTED  NONE DETECTED   Barbiturates NONE DETECTED  NONE DETECTED   Laboratory interpretation all normal except severe anemia, leukocytosis, hyperglycemia, + troponin, elevated BUN c/w GI bleed    Dg Chest 2 View  08/27/2012  *RADIOLOGY REPORT*  Clinical Data: GI bleed.  Weakness, shortness of breath.  CHEST - 2 VIEW  Comparison: No priors.  Findings: Lung volumes are low.  No consolidative airspace disease. No pleural effusions.  Mild cardiomegaly. The patient is rotated to the right on today's exam, resulting in distortion  of the mediastinal contours and reduced diagnostic sensitivity and specificity for mediastinal pathology.  Status post median sternotomy.  IMPRESSION: 1.  Mild cardiomegaly. 2.  Low lung volumes without definite radiographic evidence of acute cardiopulmonary disease. 3.  Status post median sternotomy.   Original Report Authenticated By: Florencia Reasons, M.D.       Date: 08/27/2012  Rate: 92  Rhythm: normal sinus rhythm  QRS Axis: normal  Intervals: normal  ST/T Wave abnormalities: TWI in inf/lat leads  Conduction Disutrbances:none  Narrative Interpretation:   Old EKG Reviewed: changes noted from 07/18/2012 [facility] are new   1. Altered mental status   2. Anemia   3. NSTEMI (non-ST elevated myocardial infarction)   4. Rectal bleeding   5. Acute blood loss anemia   6. Elevated troponin   7. Leukocytosis     Plan admission   CRITICAL CARE Performed by: Devoria Albe L   Total critical care time: 55 min  Critical care time was exclusive of separately billable procedures and treating other patients.  Critical care was necessary to treat or prevent imminent or life-threatening deterioration.  Critical care was time spent personally by me on the following activities: development of treatment plan with patient and/or surrogate as well as nursing, discussions with consultants, evaluation of patient's response to treatment, examination of patient, obtaining history from patient or surrogate, ordering and performing treatments and interventions, ordering and review of laboratory studies, ordering and review of radiographic studies, pulse oximetry and re-evaluation of patient's condition.   MDM           Ward Givens, MD 08/27/12 2237

## 2012-08-27 NOTE — ED Notes (Signed)
Report attempted to ICU x3 since 19:20 by throughput RN with no success.

## 2012-08-27 NOTE — ED Notes (Signed)
Patient was at PCP yesterday and had a positive hemoccult.  Today patient began to feel dizzy and passed large dark clots.  Per EMS vitals are stable but patient is lethargic and pale.

## 2012-08-27 NOTE — ED Notes (Signed)
MD at bedside. 

## 2012-08-27 NOTE — ED Notes (Signed)
Report called to [facility]

## 2012-08-27 NOTE — ED Notes (Signed)
Reported Critical Value Chem 8 Hb 5.1 to Dr Devoria Albe

## 2012-08-27 NOTE — ED Notes (Signed)
Unable to obtain blood for labs at this time with blood hanging. RN made aware

## 2012-08-27 NOTE — ED Notes (Signed)
WUJ:[id number]<JY> Expected date:08/27/12<BR> Expected time: 4:05 PM<BR> Means of arrival:Ambulance<BR> Comments:<BR> Dizziness/syncope

## 2012-08-27 NOTE — H&P (Signed)
Name: Jose Petersen MRN: [id number] DOB: [birth year]    LOS: 0  Referring Provider: tRIAD Reason for Referral: Gi bleed, anemia, resp concerns  PULMONARY / CRITICAL CARE MEDICINE  HPI:  46 year old male with history of hypertension, dyslipidemia, morbid obesity who presented to ED with complaints of feeling dizzy and has noticed blood in stool one day prior to this admission associated with poor oral intake and nausea but no vomiting. Came to [facility] ED found with change in MS. Noted hgb 5, without tachy, wide pulse pressure or shock.  Was initially found down with associated blood clots. Concern was rectal bleeding. Gi was consulted and triad admitted with aggressive PRBC transfusion. IN Ed, with flip T waves and concern ischemia, St depression as well.   Past Medical History  Diagnosis Date  . Coronary artery disease   . Hypertension   . Gout   . Diverticulitis   . Diabetes mellitus   . Kidney calculus    Past Surgical History  Procedure Date  . Coronary artery bypass graft   . Knee surgery   . Renal exploration    Prior to Admission medications   Medication Sig Start Date End Date Taking? Authorizing Provider  colchicine 0.6 MG tablet Take 1 tablet (0.6 mg total) by mouth 2 (two) times daily. 07/18/12 07/18/13  Hannah Muthersbaugh, PA-C  glucosamine-chondroitin 500-400 MG tablet Take 1 tablet by mouth daily.    Historical Provider, MD  lisinopril (PRINIVIL,ZESTRIL) 20 MG tablet Take 20 mg by mouth daily.    Historical Provider, MD  Melatonin 3 MG CAPS Take 1 capsule by mouth at bedtime as needed. For sleep    Historical Provider, MD  rosuvastatin (CRESTOR) 10 MG tablet Take 10 mg by mouth daily.    Historical Provider, MD   Allergies No Known Allergies  Family History History reviewed. No pertinent family history. Social History  reports that he has quit smoking. He has never used smokeless tobacco. He reports that he does not drink alcohol or use illicit drugs.  Review Of  Systems:  Unable to obtain lethrgic  Brief patient description:  46 yr old GI bleed, r/o ischemia. Brief change in MS which resolved.  Events Since Admission: 9/26- Gi bleed, anemia, r/o ischemia  Current Status:  Vital Signs: Temp:  [97.8 F (36.6 C)-98.7 F (37.1 C)] 98.6 F (37 C) (09/26 2145) Pulse Rate:  [87-105] 98  (09/26 2145) Resp:  [14-28] 23  (09/26 2145) BP: (110-154)/(63-88) 135/70 mmHg (09/26 2145) SpO2:  [98 %-100 %] 100 % (09/26 2103) Weight:  [110.8 kg (244 lb 4.3 oz)] 110.8 kg (244 lb 4.3 oz) (09/26 2103)  Physical Examination: Gen: pale, well appearing, no acute distress HEENT: NCAT, PERRL, EOMi, OP clear PULM: CTA B CV: RRR, no mgr, no JVD AB: BS+, soft, nontender, no hsm Ext: warm, no edema, no clubbing, no cyanosis Derm: no rash or skin breakdown Neuro: A&Ox4, CN II-XII intact, strength 5/5 in all 4 extremities   Principal Problem:  *Acute blood loss anemia Active Problems:  Elevated troponin  Upper GI bleed  Leukocytosis   ASSESSMENT AND PLAN  PULMONARY  Lab 08/27/12 2214  PHART 7.446  PCO2ART 29.7*  PO2ART 84.8  HCO3 20.1  O2SAT 98.3   Ventilator Settings:   CXR:  cardiomegally ETT:   A:  Airway protection concerns, at risk pulm edema P:   pcxr in am  May need lasix if high volume prbs resusc needed, follow clinically Will need to have Echo as  well Abg reviewed  CARDIOVASCULAR  Lab 08/27/12 1712  TROPONINI 0.32*  LATICACIDVEN --  PROBNP --   ECG:  Flip T waves precordial, no sig st changes Lines: piv  A: Gi bleed, r/o ischemia P:  Will need large bore IV 20 gauge or greater x 2 Trop cycle Echo No asa with bleed Beta blocker to hold with concerns large bleed Transfuse to hgb 8.4, in setting concerns ischemia ecg in am  Will need cards eval at some point for risk stratification pre dc  RENAL  Lab 08/27/12 1711 08/27/12 1702  NA 139 138  K 4.6 4.7  CL 108 103  CO2 -- 21  BUN 36* 33*  CREATININE 1.20 1.08    CALCIUM -- 8.8  MG -- --  PHOS -- --   Intake/Output    None    Foley:    A: Elevate BUN, r/o upper bleed with rapid transit P:   Crystalloid Follow BUN / crt ratio resus with blood, avoid gross pos balance from fluids  GASTROINTESTINAL  Lab 08/27/12 1702  AST 13  ALT 13  ALKPHOS 72  BILITOT 0.3  PROT 5.8*  ALBUMIN 3.1*    A:  Gi bleed, likely lower, however unclar BUN elevation upper? P:   ppi drip, see BUN Npo GI consulted, d/w Dr Cleda Clarks, transfuse and stabilize and will need ett if decompensate and then scope No egd / scope planned tonight  HEMATOLOGIC  Lab 08/27/12 1711 08/27/12 1702  HGB 5.1* 5.3*  HCT 15.0* 15.7*  PLT -- 341  INR -- 1.09  APTT -- 31   A:  Severe Anemia P:  No tachy, normotension, no major increase pulse pressure - days old? Cbc to follow after 3 units coags wnl scd  INFECTIOUS  Lab 08/27/12 1702  WBC 13.8*  PROCALCITON 0.24   Cultures:  Antibiotics:   A:  No evidence asp P:   pcxr in am   ENDOCRINE No results found for this basename: GLUCAP:5 in the last 168 hours A:  normoglyemia P:   cbg  NEUROLOGIC  A:  Control pain Acute encephalopathy? Transient apparently as he was alert and oriented in the ICU on my exam P:   APAP for pain control limit sedating meds  BEST PRACTICE / DISPOSITION Level of Care:  ICU  Primary Service:  [place] Consultants:  Gi, PCCM Code Status:  Full Diet:  Npo DVT Px:  scd GI Px:  ppi Social / Family:  At bedside  Yolonda Kida PCCM Pager: [phone] Cell: [phone] If no response, call [phone]   08/27/2012, 10:43 PM

## 2012-08-27 NOTE — H&P (Signed)
[facility] History and Physical  Waleed Dettman AVW:[id number] DOB: 1966/10/22 DOA: 08/27/2012  Referring physician: ER physician PCP: No primary provider on file.   Chief Complaint: dizziness, blood in stool  HPI:  46 year old male with history of hypertension, dyslipidemia, morbid obesity who presented to ED with complaints of feeling dizzy and has noticed blood in stool one day prior to this admission associated with poor oral intake and nausea but no vomiting. Patient denies previous similar symptoms. Patient specifically reports no chest pain, no shortness of breath, no palpitation. No complaints of abdominal pain.   Assessment and Plan:  Principal Problem:  *Acute blood loss anemia - secondary to probable upper GI bleed - hemoglobin on this admission 5.1 - blood transfusion started in ED, 2 units for now - appreciate GI consult; per initial phone conversation(PCCM and GI) no plan for endoscopy tonight unless patient acutely decompensated and to continue blood transfusion - follow up hemoglobin post transfusion; serial CBC's - continue protonix drip - admit to stepdown unit for further monitoring and evaluation  Active Problems: Elevated troponin - this most likely represents demand ischemia - no acute chest pain at the time of presentation - we will trend troponins for now - pepsase consider cardiology in am if troponin continue to trend up - check UDS and ethanol level  Leukocytosis - likely stress related - please follow up procalcitonin level and lactic acid - orders placed for urinalysis and urine culture, blood cultures - CXR shows no acute cardiopulmonary process - no antibiotics for now  Morbid obesity - nutrition consult   Code Status: Full Family Communication: Updated family at bedside Disposition Plan: Admit for further evaluation; stepdown unit  Manson Passey, MD  [facility] Pager [phone]  If 7PM-7AM, please contact  night-coverage [email or web address] Password [facility] 08/27/2012, 6:41 PM  Review of Systems:  Constitutional: Negative for fever, chills and positive for malaise/fatigue. Negative for diaphoresis.  HENT: Negative for hearing loss, ear pain, nosebleeds, congestion, sore throat, neck pain, tinnitus and ear discharge.   Eyes: Negative for blurred vision, double vision, photophobia, pain, discharge and redness.  Respiratory: Negative for cough, hemoptysis, sputum production, shortness of breath, wheezing and stridor.   Cardiovascular: Negative for chest pain, palpitations, orthopnea, claudication and leg swelling.  Gastrointestinal: per HPI  Genitourinary: Negative for dysuria, urgency, frequency, hematuria and flank pain.  Musculoskeletal: Negative for myalgias, back pain, joint pain and falls.  Skin: Negative for itching and rash.  Neurological: positive for dizziness and weakness. Negative for tingling, tremors, sensory change, speech change, focal weakness, loss of consciousness and headaches.  Endo/Heme/Allergies: Negative for environmental allergies and polydipsia. Does not bruise/bleed easily.  Psychiatric/Behavioral: Negative for suicidal ideas. The patient is not nervous/anxious.      Past Medical History  Diagnosis Date  . Coronary artery disease   . Hypertension   . Gout   . Diverticulitis   . Diabetes mellitus   . Kidney calculus    Past Surgical History  Procedure Date  . Coronary artery bypass graft   . Knee surgery   . Renal exploration    Social History:  reports that he has quit smoking. He has never used smokeless tobacco. He reports that he does not drink alcohol or use illicit drugs.  No Known Allergies  Family History: HTN in mother  Prior to Admission medications   Medication Sig Start Date End Date Taking? Authorizing Provider  colchicine 0.6 MG tablet Take 1 tablet (0.6 mg total) by mouth 2 (two) times  daily. 07/18/12 07/18/13  Dahlia Client Muthersbaugh, PA-C   glucosamine-chondroitin 500-400 MG tablet Take 1 tablet by mouth daily.    Historical Provider, MD  lisinopril (PRINIVIL,ZESTRIL) 20 MG tablet Take 20 mg by mouth daily.    Historical Provider, MD  Melatonin 3 MG CAPS Take 1 capsule by mouth at bedtime as needed. For sleep    Historical Provider, MD  rosuvastatin (CRESTOR) 10 MG tablet Take 10 mg by mouth daily.    Historical Provider, MD   Physical Exam: Filed Vitals:   08/27/12 1745 08/27/12 1800 08/27/12 1815 08/27/12 1830  BP:  117/83 140/66 137/88  Pulse: 105 105 102 101  Temp:  97.8 F (36.6 C) 98 F (36.7 C)   TempSrc:  Oral Oral   Resp: 28 16 25 27   SpO2:  100%  100%    Physical Exam  Constitutional: Appears acutely ill.  Morbidly obese, pale skin HENT: Normocephalic. No tonsillar erythema or exudates  Eyes: Conjunctivae pale; EOM are normal. PERRLA, no scleral icterus.  Neck: Normal ROM. Neck supple. No JVD. No tracheal deviation. No thyromegaly.  CVS: RRR, S1/S2 +, no murmurs, distant heart sounds due to morbid obesity.  Pulmonary: diminished breath sounds bilaterally, poor respiratory by patient  Abdominal: Soft. BS +,  no distension, tenderness, rebound or guarding.  Musculoskeletal: Normal range of motion. No edema and no tenderness.  Lymphadenopathy: No lymphadenopathy noted, cervical, inguinal. Neuro: Alert. Normal reflexes, muscle tone coordination. No cranial nerve deficit. Skin: Skin is warm and dry. No rash noted. Not diaphoretic. No erythema. No pallor.  Psychiatric: Normal mood and affect. Behavior, judgment, thought content normal.   Labs on Admission:  Basic Metabolic Panel:  Lab 08/27/12 7829 08/27/12 1702  NA 139 138  K 4.6 4.7  CL 108 103  CO2 -- 21  GLUCOSE 249* 258*  BUN 36* 33*  CREATININE 1.20 1.08  CALCIUM -- 8.8   Liver Function Tests:  Lab 08/27/12 1702  AST 13  ALT 13  ALKPHOS 72  BILITOT 0.3  PROT 5.8*  ALBUMIN 3.1*   CBC:  Lab 08/27/12 1711 08/27/12 1702  WBC -- 13.8*   NEUTROABS -- 12.3*  HGB 5.1* 5.3*  HCT 15.0* 15.7*  MCV -- 89.7  PLT -- 341   Cardiac Enzymes:  Lab 08/27/12 1712  CKTOTAL --  CKMB --  CKMBINDEX --  TROPONINI 0.32*    Radiological Exams on Admission: Dg Chest 2 View 08/27/2012  * IMPRESSION: 1.  Mild cardiomegaly. 2.  Low lung volumes without definite radiographic evidence of acute cardiopulmonary disease. 3.  Status post median sternotomy.

## 2012-08-27 NOTE — Consult Note (Signed)
[facility] Consultation Note  Referring Provider: Dr. Lynelle Doctor (ED); [facility]  Reason for Consultation:  Blood in stool, anemia  HPI: Jose Petersen is a 46 y.o. male whom we were asked to see for blood in stool and anemia.  History is difficult to obtain from patient; some is taken from patient, more is taken from patient's mother, who is at the bedside.  Patient was apparently in static state of GI health until yesterday, when he had a couple episodes of hematochezia.  Saw primary care physician, had positive hemoccult, and, before further plans could be made, he was found passed out this morning in pile of blood and clots.  He denies abdominal pain.  Unclear if he's had prior GI bleeding before.  Denies hematemesis.  Denies NSAIDs.  Denies prior endoscopy.  Found to have Hgb 5.1, compared to 17 about one month ago.   Past Medical History  Diagnosis Date  . Coronary artery disease   . Hypertension   . Gout   . Diverticulitis   . Diabetes mellitus   . Kidney calculus     Past Surgical History  Procedure Date  . Coronary artery bypass graft   . Knee surgery   . Renal exploration     Prior to Admission medications   Medication Sig Start Date End Date Taking? Authorizing Provider  colchicine 0.6 MG tablet Take 1 tablet (0.6 mg total) by mouth 2 (two) times daily. 07/18/12 07/18/13  Hannah Muthersbaugh, PA-C  glucosamine-chondroitin 500-400 MG tablet Take 1 tablet by mouth daily.    Historical Provider, MD  lisinopril (PRINIVIL,ZESTRIL) 20 MG tablet Take 20 mg by mouth daily.    Historical Provider, MD  Melatonin 3 MG CAPS Take 1 capsule by mouth at bedtime as needed. For sleep    Historical Provider, MD  rosuvastatin (CRESTOR) 10 MG tablet Take 10 mg by mouth daily.    Historical Provider, MD    Current Facility-Administered Medications  Medication Dose Route Frequency Provider Last Rate Last Dose  . sodium chloride 0.9 % bolus 1,000 mL  1,000 mL Intravenous Once  Ward Givens, MD   1,000 mL at 08/27/12 1726   Current Outpatient Prescriptions  Medication Sig Dispense Refill  . colchicine 0.6 MG tablet Take 1 tablet (0.6 mg total) by mouth 2 (two) times daily.  8 tablet  0  . glucosamine-chondroitin 500-400 MG tablet Take 1 tablet by mouth daily.      Marland Kitchen lisinopril (PRINIVIL,ZESTRIL) 20 MG tablet Take 20 mg by mouth daily.      . Melatonin 3 MG CAPS Take 1 capsule by mouth at bedtime as needed. For sleep      . rosuvastatin (CRESTOR) 10 MG tablet Take 10 mg by mouth daily.        Allergies as of 08/27/2012  . (No Known Allergies)    History reviewed. No pertinent family history.  History   Social History  . Marital Status: Divorced    Spouse Name: N/A    Number of Children: N/A  . Years of Education: N/A   Occupational History  . Not on file.   Social History Main Topics  . Smoking status: Former Games developer  . Smokeless tobacco: Never Used  . Alcohol Use: No  . Drug Use: No  . Sexually Active: Not Currently   Other Topics Concern  . Not on file   Social History Narrative  . No narrative on file    Review of Systems: Unable to obtain due  to altered mental status  Physical Exam: Vital signs in last 24 hours: Temp:  [97.8 F (36.6 C)-98.6 F (37 C)] 98 F (36.7 C) (09/26 1815) Pulse Rate:  [87-105] 102  (09/26 1815) Resp:  [14-28] 25  (09/26 1815) BP: (110-140)/(63-83) 140/66 mmHg (09/26 1815) SpO2:  [98 %-100 %] 100 % (09/26 1800)   General:   Waxes and wanes in-and-out of consciousness; pale Head:  Normocephalic and atraumatic. Eyes:  Sclera clear, no icterus.   Conjunctiva pale. Ears:  Normal auditory acuity. Nose:  No deformity, discharge,  or lesions. Mouth:  No deformity or lesions.  Oropharynx dry and pale Neck:  Thick Lungs:  Clear throughout to auscultation.   No wheezes, crackles, or rhonchi. No acute distress. Heart:  Mild tachycardia, Regular rate and rhythm; no murmurs, clicks, rubs,  or gallops. Abdomen:   Soft, protuberant, nontender and nondistended. No masses, hepatosplenomegaly or hernias noted. Normal bowel sounds, without guarding, and without rebound.     Msk:  Symmetrical without gross deformities. Normal posture. Pulses:  Normal pulses noted. Extremities:  Without clubbing or edema. Neurologic:  In-and-out of consciousness; no other focal deficits Skin:  Pale, otherwise Intact without significant lesions or rashes. Psych:  In and out of consciousness   Lab Results:  Basename 08/27/12 1711 08/27/12 1702  WBC -- 13.8*  HGB 5.1* 5.3*  HCT 15.0* 15.7*  PLT -- 341   BMET  Basename 08/27/12 1711 08/27/12 1702  NA 139 138  K 4.6 4.7  CL 108 103  CO2 -- 21  GLUCOSE 249* 258*  BUN 36* 33*  CREATININE 1.20 1.08  CALCIUM -- 8.8   LFT  Basename 08/27/12 1702  PROT 5.8*  ALBUMIN 3.1*  AST 13  ALT 13  ALKPHOS 72  BILITOT 0.3  BILIDIR --  IBILI --   PT/INR  Basename 08/27/12 1702  LABPROT 14.0  INR 1.09    Studies/Results: Dg Chest 2 View  08/27/2012  *RADIOLOGY REPORT*  Clinical Data: GI bleed.  Weakness, shortness of breath.  CHEST - 2 VIEW  Comparison: No priors.  Findings: Lung volumes are low.  No consolidative airspace disease. No pleural effusions.  Mild cardiomegaly. The patient is rotated to the right on today's exam, resulting in distortion of the mediastinal contours and reduced diagnostic sensitivity and specificity for mediastinal pathology.  Status post median sternotomy.  IMPRESSION: 1.  Mild cardiomegaly. 2.  Low lung volumes without definite radiographic evidence of acute cardiopulmonary disease. 3.  Status post median sternotomy.   Original Report Authenticated By: Florencia Reasons, M.D.    Impression:  1.  Maroon stools.  Unclear upper versus lower source.  Elevated BUN more favors upper (stomach or duodenal) source, blood clots per rectum more favors lower (colonic) source.  He has no hematochezia.  He is hemodynamically stable. 2.  Acute blood  loss anemia. 3.  Vacillating mental status, due to #1 and #2 above. 4.  Multiple chronic medical problems.  Plan:  1.  I have discussed case with Dr. Lynelle Doctor (ED physician) as well as Dr. Tyson Alias with Critical Care.  At this point (altered mental status, hemoglobin 5, hemodynamic stability), I feel risks of complications (aspiration, cardiopulmonary arrest)  from any endoscopic procedure done right now far outweigh the benefits.  Ideally, would pursue volume resuscitation (IVF and blood transfusion) and intensive antisecretory therapy overnight, and proceed with upper endoscopy in the morning.  However, if the patient becomes unstable overnight (manifested by altered vital signs, worsening volume and  frequency of bleeding), he would require intubation prior to an urgent upper endoscopy. 2.  Protonix drip. 3.  Serial CBCs. 4.  NPO. 5.  Will follow.  Thank you for the consult.   LOS: 0 days   Ariz Terrones M  08/27/2012, 6:34 PM

## 2012-08-28 ENCOUNTER — Inpatient Hospital Stay (HOSPITAL_COMMUNITY): Payer: Self-pay

## 2012-08-28 ENCOUNTER — Encounter (HOSPITAL_COMMUNITY): Payer: Self-pay

## 2012-08-28 ENCOUNTER — Inpatient Hospital Stay (HOSPITAL_COMMUNITY): Payer: MEDICAID

## 2012-08-28 ENCOUNTER — Encounter (HOSPITAL_COMMUNITY)
Admission: EM | Disposition: A | Discharge status: Home / Self Care | Payer: Self-pay | Source: Home / Self Care | Attending: Internal Medicine

## 2012-08-28 DIAGNOSIS — E1151 Type 2 diabetes mellitus with diabetic peripheral angiopathy without gangrene: Secondary | ICD-10-CM | POA: Diagnosis present

## 2012-08-28 DIAGNOSIS — I369 Nonrheumatic tricuspid valve disorder, unspecified: Secondary | ICD-10-CM

## 2012-08-28 HISTORY — PX: ESOPHAGOGASTRODUODENOSCOPY: SHX5428

## 2012-08-28 LAB — GLUCOSE, CAPILLARY: Glucose-Capillary: 263 mg/dL — ABNORMAL HIGH (ref 70–99)

## 2012-08-28 LAB — DIFFERENTIAL
Basophils Absolute: 0 10*3/uL (ref 0.0–0.1)
Eosinophils Absolute: 0.1 10*3/uL (ref 0.0–0.7)
Lymphocytes Relative: 13 % (ref 12–46)
Neutro Abs: 9.1 10*3/uL — ABNORMAL HIGH (ref 1.7–7.7)
Neutrophils Relative %: 78 % — ABNORMAL HIGH (ref 43–77)

## 2012-08-28 LAB — CBC
HCT: 20.6 % — ABNORMAL LOW (ref 39.0–52.0)
HCT: 21 % — ABNORMAL LOW (ref 39.0–52.0)
HCT: 23.1 % — ABNORMAL LOW (ref 39.0–52.0)
HCT: 23.2 % — ABNORMAL LOW (ref 39.0–52.0)
Hemoglobin: 7.3 g/dL — ABNORMAL LOW (ref 13.0–17.0)
Hemoglobin: 8 g/dL — ABNORMAL LOW (ref 13.0–17.0)
MCH: 30.8 pg (ref 26.0–34.0)
MCH: 30.5 pg (ref 26.0–34.0)
MCH: 30 pg (ref 26.0–34.0)
MCH: 29.7 pg (ref 26.0–34.0)
MCHC: 34.8 g/dL (ref 30.0–36.0)
MCHC: 34.6 g/dL (ref 30.0–36.0)
MCV: 86.9 fL (ref 78.0–100.0)
MCV: 87.9 fL (ref 78.0–100.0)
MCV: 87.2 fL (ref 78.0–100.0)
Platelets: 259 10*3/uL (ref 150–400)
RBC: 2.37 MIL/uL — ABNORMAL LOW (ref 4.22–5.81)
RBC: 2.67 MIL/uL — ABNORMAL LOW (ref 4.22–5.81)
RBC: 2.66 MIL/uL — ABNORMAL LOW (ref 4.22–5.81)
RDW: 15.4 % (ref 11.5–15.5)
WBC: 11.6 10*3/uL — ABNORMAL HIGH (ref 4.0–10.5)

## 2012-08-28 LAB — PRO B NATRIURETIC PEPTIDE: Pro B Natriuretic peptide (BNP): 5097 pg/mL — ABNORMAL HIGH (ref 0–125)

## 2012-08-28 LAB — PHOSPHORUS: Phosphorus: 3.7 mg/dL (ref 2.3–4.6)

## 2012-08-28 LAB — COMPREHENSIVE METABOLIC PANEL
Alkaline Phosphatase: 64 U/L (ref 39–117)
BUN: 36 mg/dL — ABNORMAL HIGH (ref 6–23)
Calcium: 8.6 mg/dL (ref 8.4–10.5)
Creatinine, Ser: 1.18 mg/dL (ref 0.50–1.35)
GFR calc Af Amer: 83 mL/min — ABNORMAL LOW (ref >90)
Glucose, Bld: 198 mg/dL — ABNORMAL HIGH (ref 70–99)
Total Protein: 5.2 g/dL — ABNORMAL LOW (ref 6.0–8.3)

## 2012-08-28 LAB — MAGNESIUM: Magnesium: 2.1 mg/dL (ref 1.5–2.5)

## 2012-08-28 LAB — URINE CULTURE: Culture: NO GROWTH

## 2012-08-28 LAB — ETHANOL: Alcohol, Ethyl (B): <11 mg/dL (ref 0–11)

## 2012-08-28 LAB — APTT: aPTT: 31 seconds (ref 24–37)

## 2012-08-28 LAB — HEMOGLOBIN A1C: Mean Plasma Glucose: 169 mg/dL — ABNORMAL HIGH (ref <117)

## 2012-08-28 LAB — LACTIC ACID, PLASMA: Lactic Acid, Venous: 0.8 mmol/L (ref 0.5–2.2)

## 2012-08-28 SURGERY — EGD (ESOPHAGOGASTRODUODENOSCOPY)
Anesthesia: Moderate Sedation

## 2012-08-28 MED ORDER — HYDRALAZINE HCL 20 MG/ML IJ SOLN
10.0000 mg | Freq: Once | INTRAMUSCULAR | Status: AC
  Administered 2012-08-28: 10 mg via INTRAVENOUS
  Filled 2012-08-28: qty 1

## 2012-08-28 MED ORDER — SODIUM CHLORIDE 0.9 % IJ SOLN
INTRAMUSCULAR | Status: DC | PRN
  Administered 2012-08-28: 08:00:00

## 2012-08-28 MED ORDER — SODIUM CHLORIDE 0.9 % IJ SOLN
INTRAMUSCULAR | Status: AC
  Filled 2012-08-28: qty 3

## 2012-08-28 MED ORDER — PERFLUTREN LIPID MICROSPHERE
1.0000 mL | INTRAVENOUS | Status: AC | PRN
  Administered 2012-08-28: 2 mL via INTRAVENOUS
  Filled 2012-08-28: qty 10

## 2012-08-28 MED ORDER — FENTANYL CITRATE 0.05 MG/ML IJ SOLN
INTRAMUSCULAR | Status: AC
  Filled 2012-08-28: qty 2

## 2012-08-28 MED ORDER — FUROSEMIDE 10 MG/ML IJ SOLN
20.0000 mg | Freq: Once | INTRAMUSCULAR | Status: AC
  Administered 2012-08-28: 20 mg via INTRAVENOUS
  Filled 2012-08-28: qty 2

## 2012-08-28 MED ORDER — BUTAMBEN-TETRACAINE-BENZOCAINE 2-2-14 % EX AERO
INHALATION_SPRAY | CUTANEOUS | Status: DC | PRN
  Administered 2012-08-28: 2 via TOPICAL

## 2012-08-28 MED ORDER — FENTANYL CITRATE 0.05 MG/ML IJ SOLN
25.0000 ug | Freq: Once | INTRAMUSCULAR | Status: AC
  Administered 2012-08-28: 25 ug via INTRAVENOUS

## 2012-08-28 MED ORDER — FENTANYL CITRATE 0.05 MG/ML IJ SOLN
INTRAMUSCULAR | Status: DC | PRN
  Administered 2012-08-28 (×2): 25 ug via INTRAVENOUS

## 2012-08-28 MED ORDER — FENTANYL CITRATE 0.05 MG/ML IJ SOLN
INTRAMUSCULAR | Status: AC
  Filled 2012-08-28: qty 4

## 2012-08-28 MED ORDER — MIDAZOLAM HCL 10 MG/2ML IJ SOLN
INTRAMUSCULAR | Status: AC
  Filled 2012-08-28: qty 4

## 2012-08-28 MED ORDER — DIPHENHYDRAMINE HCL 50 MG/ML IJ SOLN
INTRAMUSCULAR | Status: AC
  Filled 2012-08-28: qty 1

## 2012-08-28 MED ORDER — HYDROMORPHONE HCL PF 1 MG/ML IJ SOLN
0.5000 mg | INTRAMUSCULAR | Status: AC | PRN
  Administered 2012-08-28 (×3): 0.5 mg via INTRAVENOUS
  Filled 2012-08-28 (×2): qty 1

## 2012-08-28 MED ORDER — SODIUM CHLORIDE 0.9 % IV SOLN: INTRAVENOUS | Status: DC

## 2012-08-28 MED ORDER — INSULIN ASPART 100 UNIT/ML ~~LOC~~ SOLN
0.0000 [IU] | Freq: Three times a day (TID) | SUBCUTANEOUS | Status: DC
  Administered 2012-08-28: 5 [IU] via SUBCUTANEOUS
  Administered 2012-08-28: 3 [IU] via SUBCUTANEOUS
  Administered 2012-08-29 (×2): 2 [IU] via SUBCUTANEOUS
  Administered 2012-08-29: 3 [IU] via SUBCUTANEOUS
  Administered 2012-08-30: 1 [IU] via SUBCUTANEOUS
  Administered 2012-08-30 (×2): 2 [IU] via SUBCUTANEOUS
  Administered 2012-08-31: 1 [IU] via SUBCUTANEOUS
  Administered 2012-08-31: 2 [IU] via SUBCUTANEOUS

## 2012-08-28 MED ORDER — MIDAZOLAM HCL 10 MG/2ML IJ SOLN
INTRAMUSCULAR | Status: DC | PRN
  Administered 2012-08-28 (×2): 1 mg via INTRAVENOUS
  Administered 2012-08-28: 2 mg via INTRAVENOUS

## 2012-08-28 NOTE — Progress Notes (Signed)
*  PRELIMINARY RESULTS* Echocardiogram 2D Echocardiogram has been performed.  Jeryl Columbia 08/28/2012, 11:09 AM

## 2012-08-28 NOTE — Progress Notes (Signed)
CARE MANAGEMENT NOTE 08/28/2012  Patient:  KARLIS, CREGG   Account Number:  [id number]  Date Initiated:  08/28/2012  Documentation initiated by:  Carthel Castille  Subjective/Objective Assessment:   pt with active gi bld from duoden ulcer, hgb 5.0 on admission, ecg showing ischemic changes, egd doen and bleeding controlled.     Action/Plan:   from home   Anticipated DC Date:  08/31/2012   Anticipated DC Plan:  HOME/SELF CARE  In-house referral  NA      DC Planning Services  NA      [facility] Choice  NA   Choice offered to / List presented to:  NA   DME arranged  NA      DME agency  NA     HH arranged  NA      HH agency  NA   Status of service:  In process, will continue to follow Medicare Important Message given?  NA - LOS <3 / Initial given by admissions (If response is "NO", the following Medicare IM given date fields will be blank) Date Medicare IM given:   Date Additional Medicare IM given:    Discharge Disposition:    Per UR Regulation:  Reviewed for med. necessity/level of care/duration of stay  If discussed at Long Length of Stay Meetings, dates discussed:    Comments:  [id number]/Edouard Gikas Earlene Plater, RN, BSN, CCM: CHART REVIEWED AND UPDATED. NO DISCHARGE NEEDS PRESENT AT THIS TIME. CASE MANAGEMENT [phone]

## 2012-08-28 NOTE — Progress Notes (Signed)
INITIAL ADULT NUTRITION ASSESSMENT Date: 08/28/2012   Time: 10:19 AM Reason for Assessment: Consult  ASSESSMENT: Male 46 y.o.  Dx: Acute blood loss anemia, GI bleed, s/p endo this am  Hx:  Past Medical History  Diagnosis Date  . Coronary artery disease   . Hypertension   . Gout   . Diverticulitis   . Diabetes mellitus   . Kidney calculus    Past Surgical History  Procedure Date  . Coronary artery bypass graft   . Knee surgery   . Renal exploration     Related Meds:     . atorvastatin  20 mg Oral q1800  . fentaNYL  25 mcg Intravenous Once  . pantoprazole (PROTONIX) IV  80 mg Intravenous Once  . sodium chloride  1,000 mL Intravenous Once  . sodium chloride  3 mL Intravenous Q12H  . DISCONTD: colchicine  0.6 mg Oral BID     Ht: 5\' 9"  (175.3 cm)  Wt: 244 lb 4.3 oz (110.8 kg)  Ideal Wt: 72.7 kg % Ideal Wt: 152  Usual Wt:  Wt Readings from Last 10 Encounters:  08/27/12 244 lb 4.3 oz (110.8 kg)  08/27/12 244 lb 4.3 oz (110.8 kg)  07/18/12 238 lb (107.956 kg)    % Usual Wt: 103  Body mass index is 36.07 kg/(m^2).-Obesity grade 2  Labs:  CMP     Component Value Date/Time   NA 138 08/28/2012 0420   K 4.2 08/28/2012 0420   CL 106 08/28/2012 0420   CO2 21 08/28/2012 0420   GLUCOSE 198* 08/28/2012 0420   BUN 36* 08/28/2012 0420   CREATININE 1.18 08/28/2012 0420   CALCIUM 8.6 08/28/2012 0420   PROT 5.2* 08/28/2012 0420   ALBUMIN 2.7* 08/28/2012 0420   AST 10 08/28/2012 0420   ALT 11 08/28/2012 0420   ALKPHOS 64 08/28/2012 0420   BILITOT 0.7 08/28/2012 0420   GFRNONAA 71* 08/28/2012 0420   GFRAA 83* 08/28/2012 0420  Results for Jose Petersen, Jose Petersen (MRN [id number]) as of 08/28/2012 10:22  Ref. Range 08/28/2012 04:20  WBC Latest Range: 4.0-10.5 K/uL 11.6 (H)  RBC Latest Range: 4.22-5.81 MIL/uL 2.37 (L)  Hemoglobin Latest Range: 13.0-17.0 g/dL 7.3 (L)  HCT Latest Range: 39.0-52.0 % 20.6 (L)  MCV Latest Range: 78.0-100.0 fL 86.9  MCH Latest Range: 26.0-34.0 pg 30.8  MCHC  Latest Range: 30.0-36.0 g/dL 65.7  RDW Latest Range: 11.5-15.5 % 15.2  Platelets Latest Range: 150-400 K/uL 260    I/O last 3 completed shifts: In: 925 [I.V.:600; Blood:325] Out: 750 [Urine:750] Total I/O In: 360 [P.O.:360] Out: -      Diet Order: Clear Liquid  Supplements/Tube Feeding:  none  IVF:    sodium chloride Last Rate: 75 mL/hr at 08/28/12 0840  pantoprozole (PROTONIX) infusion Last Rate: 8 mg/hr (08/27/12 2106)  DISCONTD: sodium chloride     Results for Jose Petersen, Jose Petersen (MRN [id number]) as of 08/28/2012 10:22  Ref. Range 08/28/2012 04:20  WBC Latest Range: 4.0-10.5 K/uL 11.6 (H)  RBC Latest Range: 4.22-5.81 MIL/uL 2.37 (L)  Hemoglobin Latest Range: 13.0-17.0 g/dL 7.3 (L)  HCT Latest Range: 39.0-52.0 % 20.6 (L)  MCV Latest Range: 78.0-100.0 fL 86.9  MCH Latest Range: 26.0-34.0 pg 30.8  MCHC Latest Range: 30.0-36.0 g/dL 84.1   RDW Latest Range: 11.5-15.5 % 15.2  Platelets Latest Range: 150-400 K/uL 260  Estimated Nutritional Needs:   Kcal: 2000-2100 Protein: 110-120 gm  Fluid: >2L  Nutrition Hx:  Pt reports weight gain recently secondary to fluid.  Ate well.  Regular diet.  Pt is diabetic.  Currently tolerating clear liquid diet well.  NUTRITION DIAGNOSIS: -Inadequate oral intake (NI-2.1).  Status: Ongoing  RELATED TO: altered gi function  AS EVIDENCE BY: clear liquid diet  MONITORING/EVALUATION(Goals): Monitor:  Diet advancement, intake, labs, weight, appropriateness of education Goal:  Tolerate diet advancement  EDUCATION NEEDS: -Education not appropriate at this time  INTERVENTION: Diet advancement as tolerated per MD Monitor for appropriateness of education.  Please consult if needed over the weekend. Recommend outpatient RD follow up.  Weight loss and DM diet.  Dietitian #:Garrus Gauthreaux Derrell Lolling, RD, LDN Clinical Inpatient Dietitian Pager:  [phone] Weekend and after hours pager:  [phone]    DOCUMENTATION CODES Per approved criteria  -Obesity  grade 2    [name] 08/28/2012, 10:19 AM

## 2012-08-28 NOTE — Op Note (Signed)
[facility] [address] [place], [address]   ENDOSCOPY PROCEDURE REPORT  PATIENT: Jose Petersen, Jose Petersen  MR#: [id number] BIRTHDATE: 06-19-1966 , 46  yrs. old GENDER: Male ENDOSCOPIST: Willis Modena, MD REFERRED BY:  Manson Passey, MD [facility]) PROCEDURE DATE:  08/28/2012 PROCEDURE:  EGD w/ control of bleeding and EGD w/ directed submucosal injection(s), any substance ASA CLASS:     Class III INDICATIONS:  hematochezia.   heme positive stool.   acute post hemorrhagic anemia. MEDICATIONS: Fentanyl 50 mcg IV and Versed 4 mg IV TOPICAL ANESTHETIC: Cetacaine Spray  DESCRIPTION OF PROCEDURE: After the risks benefits and alternatives of the procedure were thoroughly explained, informed consent was obtained.  The Pentax Gastroscope [id number] endoscope was introduced through the mouth and advanced to the second portion of the duodenum. Without limitations.  The instrument was slowly withdrawn as the mucosa was fully examined.     Findings:  Normal esophagus.  Blood clots in stomach; these were suctioned out and no underlying gastric pathology was seen.  Lots of edema and spasm in the distal duodenal bulb, with overlying clot.  The clot was removed; subsequently,   1cm diameter moderately deep ulcer was seen.  There was active blood oozing from the ulcer crater.  2 cc of diluted 1:10,000 EPI was injected quadrantically about the ulcer with good blanching effect. Subsequently, the spot of ulcer oozing was treated with BiCap. Hemostasis was achieved.  The scope was then withdrawn from the patient and the procedure completed.  ENDOSCOPIC IMPRESSION:     As above.  Bleeding duodenal ulcer source of patients blood in stool and acute blood loss anemia.  Source to include NSAIDs (he is on ASA daily) versus H. pylori.  RECOMMENDATIONS:     1.  Watch for potential complications of procedure. 2.  Protonix drip for the next 48 hours. 3.  Serial CBCs and blood transfusion as  needed. 4.  No ASA/NSAIDs/anticoagulants unless absolutely mandated. 5.  Check H. pylori serologies. 6.  Clear liquids only today, may advance tomorrow pending clinical course. 7.  If patient has significant active rebleeding, would consider repeat endoscopy. 8.  Will follow.  eSigned:  Willis Modena, MD 08/28/2012 8:28 AM   CC:

## 2012-08-28 NOTE — Interval H&P Note (Signed)
History and Physical Interval Note:  08/28/2012 7:40 AM  Jose Petersen  has presented today for surgery, with the diagnosis of GI Bleed  The various methods of treatment have been discussed with the patient and family. After consideration of risks, benefits and other options for treatment, the patient has consented to  Procedure(s) (LRB) with comments: ESOPHAGOGASTRODUODENOSCOPY (EGD) (N/A) as a surgical intervention .  The patient's history has been reviewed, patient examined, no change in status, stable for surgery.  I have reviewed the patient's chart and labs.  Questions were answered to the patient's satisfaction.     Dishawn Bhargava M  Assessment:  1.  Acute GI bleeding.  Unclear upper versus lower source. 2.  Significant anemia, improving after transfusion. 3.  Elevated cardiac enzymes and ST depression on yesterday's EKG.  Strongly suspect supply-demand mismatch from bleeding.  He has no chest pain.  Plan:  1.  Endoscopy with possible hemostatic therapy. 2.  Risks (bleeding, infection, bowel perforation that could require surgery, sedation-related changes in cardiopulmonary systems), benefits (identification and possible treatment of source of symptoms, exclusion of certain causes of symptoms), and alternatives (watchful waiting, radiographic imaging studies, empiric medical treatment) of upper endoscopy (EGD) were explained to patient in detail and patient wishes to proceed.

## 2012-08-28 NOTE — Progress Notes (Addendum)
Name: Jose Petersen MRN: [id number] DOB: 1966-05-13    LOS: 1  Referring Provider: [facility]AD Reason for Referral: Gi bleed, anemia, resp concerns  PULMONARY / CRITICAL CARE MEDICINE  HPI:  46 year old male with history of hypertension, dyslipidemia, morbid obesity who presented to ED with complaints of feeling dizzy and has noticed blood in stool one day prior to this admission associated with poor oral intake and nausea but no vomiting. Came to [facility] ED found with change in MS. Noted hgb 5, without tachy, wide pulse pressure or shock.  Was initially found down with associated blood clots. Concern was rectal bleeding. Gi was consulted and [facility] admitted with aggressive PRBC transfusion. IN Ed, with flip T waves and concern ischemia, St depression as well.    Events Since Admission: 9/26- Gi bleed, anemia, r/o ischemia 9/27 Endo with duodenal bleed.  Current Status:  Vital Signs: Temp:  [97.8 F (36.6 C)-98.8 F (37.1 C)] 97.9 F (36.6 C) (09/27 0645) Pulse Rate:  [87-105] 97  (09/27 0645) Resp:  [12-32] 31  (09/27 0910) BP: (110-176)/(1-127) 164/99 mmHg (09/27 0910) SpO2:  [96 %-100 %] 98 % (09/27 0910) Weight:  [110.8 kg (244 lb 4.3 oz)] 110.8 kg (244 lb 4.3 oz) (09/26 2103)  Physical Examination: Gen: pale, well appearing, no acute distress. Just returned from endo lab HEENT: NCAT, PERRL, EOMi, OP clear PULM: CTA B CV: RRR, no mgr, no JVD AB: BS+, soft, nontender, no hsm Ext: warm, no edema, no clubbing, no cyanosis Derm: no rash or skin breakdown Neuro: A&Ox4, CN II-XII intact, strength 5/5 in all 4 extremities.   Principal Problem:  *Acute blood loss anemia Active Problems:  Elevated troponin  Upper GI bleed  Leukocytosis  Encephalopathy acute   ASSESSMENT AND PLAN  PULMONARY  Lab 08/27/12 2214  PHART 7.446  PCO2ART 29.7*  PO2ART 84.8  HCO3 20.1  O2SAT 98.3   Ventilator Settings:   CXR:   9/27: Unchanged cardiomegaly without evidence of failure. Basilar    atelectasis.  ETT:   A:  Airway protection concerns, at risk pulm edema P:   May need lasix if high volume prbs resusc needed, follow clinically    CARDIOVASCULAR  Lab 08/28/12 0420 08/27/12 1712  TROPONINI 0.59* 0.32*  LATICACIDVEN 0.8 --  PROBNP 5097.0* --   ECG:  Flip T waves precordial, no sig st changes Lines: piv  A: Gi bleed, r/o ischemia P:  Will need large bore IV 20 gauge or greater x 2 Trop cycle Echo No asa with bleed Beta blocker to hold with concerns large bleed Transfuse to hgb 8.4, in setting concerns ischemia ecg in am  Will need cards eval at some point for risk stratification pre dc  RENAL  Lab 08/28/12 0420 08/27/12 1711 08/27/12 1702  NA 138 139 138  K 4.2 4.6 --  CL 106 108 103  CO2 21 -- 21  BUN 36* 36* 33*  CREATININE 1.18 1.20 1.08  CALCIUM 8.6 -- 8.8  MG 2.1 -- --  PHOS 3.7 -- --   Intake/Output      09/26 0701 - 09/27 0700 09/27 0701 - 09/28 0700   P.O.  360   I.V. (mL/kg) 600 (5.4)    Blood 325    Total Intake(mL/kg) 925 (8.3) 360 (3.2)   Urine (mL/kg/hr) 750 (0.3)    Total Output 750    Net +175 +360         Foley:    A: Elevate BUN, r/o upper bleed  with rapid transit P:   Crystalloid Follow BUN / crt ratio resus with blood, avoid gross pos balance from fluids  GASTROINTESTINAL  Lab 08/28/12 0420 08/27/12 1702  AST 10 13  ALT 11 13  ALKPHOS 64 72  BILITOT 0.7 0.3  PROT 5.2* 5.8*  ALBUMIN 2.7* 3.1*    A:  Gi bleed, likely lower, however unclar BUN elevation upper? Per Endo 9/27 duodenal bleed P:   ppi drip, see BUN Npo GI consulted, d/w Dr Cleda Clarks, transfuse and stabilize and will need ett if decompensate and then scope EGD + duodenal ulcer. May need further endoscopic interventions in future.  HEMATOLOGIC  Lab 08/28/12 0420 08/27/12 1711 08/27/12 1702  HGB 7.3* 5.1* 5.3*  HCT 20.6* 15.0* 15.7*  PLT 260 -- 341  INR 1.13 -- 1.09  APTT 31 -- 31   A:  Severe Anemia P:  No tachy, normotension, no  major increase pulse pressure - days old? + duodenal ulcer Cbc to follow after 3 units coags wnl scd serial cbc/s  INFECTIOUS  Lab 08/28/12 0420 08/27/12 1702  WBC 11.6* 13.8*  PROCALCITON -- 0.24   Cultures:  Antibiotics:   A:  No evidence asp P:   pcxr   ENDOCRINE  Lab 08/28/12 0033  GLUCAP 195*   A:  normoglyemia P:   cbg  NEUROLOGIC  A:  Control pain Acute encephalopathy? Transient apparently as he was alert and oriented in the ICU  P:   APAP for pain control limit sedating meds  PCCM will sign off 9/28.   BEST PRACTICE / DISPOSITION Level of Care:  ICU  Primary Service:  [place] Consultants:  Gi, PCCM Code Status:  Full Diet:  Npo DVT Px:  scd GI Px:  ppi Social / Family:  At bedside updated   Brett Canales Minor ACNP Adolph Pollack PCCM Pager [phone] till 3 pm If no answer page [phone] 08/28/2012, 10:11 AM   PCCM will sign off. Please call if we can be of further assistance. He should be ready for transfer out of ICU/SDU 9/28 AM or sooner if no evidence of active bleeding  Billy Fischer, MD ; [facility] service Mobile [phone].  After 5:30 PM or weekends, call [phone]

## 2012-08-28 NOTE — Progress Notes (Signed)
Pt transported to Endo. dph

## 2012-08-28 NOTE — Progress Notes (Signed)
Spoke with Dr Gwenlyn Perking re: elevated BP.  Per Dr Gwenlyn Perking -   Please contact MD if DBP >105 or SBP >180.   dph

## 2012-08-28 NOTE — Progress Notes (Signed)
[facility] PROGRESS NOTE  Jose Petersen NWG:[id number] DOB: Apr 09, 1966 DOA: 08/27/2012 PCP: No primary provider on file.  Assessment/Plan: 1-Acute blood loss anemia 2/2 duodenal ulcer: after 3 units of PRBC's given on admission Hgb 7.3; no further bleeding appreciated. EGD with positive duodenal ulcer. Per GI recommendations, advance diet to CL; avoid anticoagulation therapy and NSAID's and continue protonix drip for 48hrs. Patient hemodynamically stable at this moment; will transfer to telemetry in am if no further bleeding appreciated.  2-Elevated troponin: no CP or SOB. Believe is 2/2 demand ischemia due to severe anemia. Will transfuse another unit of PRBC and follow 2-D echo. Will hold on cardiology consult at this moment.  3-Upper GI bleed: per GI; will continue IV protonix, CLD and no ASA or NSAID's,  4-Leukocytosis: reactive leukocytosis, due to stress and demargination. No source of infection identified. Will follow WBC's trend.  5-Encephalopathy acute: associated with acute blood loss anemia most likely. No infection appreciated. After transfusion and hemodynamically stability achieved mentation almost back to baseline.  6-DM (diabetes mellitus):no hx of DM; but 2 fasting reading of CBG > 180. Will start SSI and check A1C  DVT: SCD's  Code Status: Full Family Communication: mother at bedside Disposition Plan: home when medically stable   Brief narrative: 46 year old male with history of hypertension, dyslipidemia, morbid obesity who presented to ED with complaints of feeling dizzy and has noticed blood in stool one day prior to this admission associated with poor oral intake and nausea but no vomiting. Patient denies previous similar symptoms. Patient specifically reports no chest pain, no shortness of breath, no palpitation. No complaints of abdominal pain.    Consultants:  PCCM  GI  Procedures:  EGD (positive duodenal  ulcer)  Antibiotics:  none  HPI/Subjective: Slightly somnolent after EGD, but easily aroused and appropriate; afebrile. Denies SOB or CP. Slight epigastric/mid abdomen discomfort. No N/V  Objective: Filed Vitals:   08/28/12 1200 08/28/12 1215 08/28/12 1243 08/28/12 1245  BP: 151/97 150/98 160/87 156/88  Pulse: 96 94  95  Temp: 98.2 F (36.8 C)  98.1 F (36.7 C)   TempSrc: Axillary  Oral   Resp: 17 14  17   Height:      Weight:      SpO2: 96% 96%      Intake/Output Summary (Last 24 hours) at 08/28/12 1300 Last data filed at 08/28/12 1245  Gross per 24 hour  Intake 1826.33 ml  Output    750 ml  Net 1076.33 ml   Filed Weights   08/27/12 2103  Weight: 110.8 kg (244 lb 4.3 oz)    Exam:   General:  Pale on exam; no acute distress  Cardiovascular: mild tachycardia, no rubs or gallops  Respiratory: CTA,   Abdomen: soft, NT, ND positive BS  Extremities: trace edema bilaterally  Neuro:AAOX3; no focal deficit  Data Reviewed: Basic Metabolic Panel:  Lab 08/28/12 5784 08/27/12 1711 08/27/12 1702  NA 138 139 138  K 4.2 4.6 4.7  CL 106 108 103  CO2 21 -- 21  GLUCOSE 198* 249* 258*  BUN 36* 36* 33*  CREATININE 1.18 1.20 1.08  CALCIUM 8.6 -- 8.8  MG 2.1 -- --  PHOS 3.7 -- --   Liver Function Tests:  Lab 08/28/12 0420 08/27/12 1702  AST 10 13  ALT 11 13  ALKPHOS 64 72  BILITOT 0.7 0.3  PROT 5.2* 5.8*  ALBUMIN 2.7* 3.1*   CBC:  Lab 08/28/12 1020 08/28/12 0420 08/27/12 1711 08/27/12 1702  WBC 12.4* 11.6* -- 13.8*  NEUTROABS -- 9.1* -- 12.3*  HGB 7.3* 7.3* 5.1* 5.3*  HCT 21.0* 20.6* 15.0* 15.7*  MCV 87.9 86.9 -- 89.7  PLT 266 260 -- 341   Cardiac Enzymes:  Lab 08/28/12 1020 08/28/12 0420 08/27/12 1712  CKTOTAL -- -- --  CKMB -- -- --  CKMBINDEX -- -- --  TROPONINI 0.46* 0.59* 0.32*   BNP (last 3 results)  Basename 08/28/12 0420  PROBNP 5097.0*   CBG:  Lab 08/28/12 1107 08/28/12 0033  GLUCAP 263* 195*    Recent Results (from the past  240 hour(s))  MRSA PCR SCREENING     Status: Normal   Collection Time   08/27/12  9:31 PM      Component Value Range Status Comment   MRSA by PCR NEGATIVE  NEGATIVE Final      Studies: Dg Chest 2 View  08/27/2012  *RADIOLOGY REPORT*  Clinical Data: GI bleed.  Weakness, shortness of breath.  CHEST - 2 VIEW  Comparison: No priors.  Findings: Lung volumes are low.  No consolidative airspace disease. No pleural effusions.  Mild cardiomegaly. The patient is rotated to the right on today's exam, resulting in distortion of the mediastinal contours and reduced diagnostic sensitivity and specificity for mediastinal pathology.  Status post median sternotomy.  IMPRESSION: 1.  Mild cardiomegaly. 2.  Low lung volumes without definite radiographic evidence of acute cardiopulmonary disease. 3.  Status post median sternotomy.   Original Report Authenticated By: Florencia Reasons, M.D.    Dg Chest Port 1 View  08/28/2012  *RADIOLOGY REPORT*  Clinical Data: Pulmonary edema.  PORTABLE CHEST - 1 VIEW  Comparison: 08/27/2012.  Findings: Cardiomegaly.  Median sternotomy, presumably for CABG. Basilar atelectasis is present.  No airspace disease.  No effusion. No convincing evidence of pulmonary edema. Monitoring leads are projected over the chest.  IMPRESSION: Unchanged cardiomegaly without evidence of failure.  Basilar atelectasis.   Original Report Authenticated By: Andreas Newport, M.D.     Scheduled Meds:   . atorvastatin  20 mg Oral q1800  . fentaNYL  25 mcg Intravenous Once  . furosemide  20 mg Intravenous Once  . insulin aspart  0-9 Units Subcutaneous TID WC  . pantoprazole (PROTONIX) IV  80 mg Intravenous Once  . sodium chloride  1,000 mL Intravenous Once  . sodium chloride  3 mL Intravenous Q12H  . sodium chloride      . DISCONTD: colchicine  0.6 mg Oral BID   Continuous Infusions:   . sodium chloride 75 mL/hr at 08/28/12 0840  . pantoprozole (PROTONIX) infusion 8 mg/hr (08/27/12 2106)  . DISCONTD:  sodium chloride      Time spent: > 30 minutes     Kialee Kham  [facility] Pager [phone]. If 8PM-8AM, please contact night-coverage at [email or web address], password [facility] 08/28/2012, 1:00 PM  LOS: 1 day

## 2012-08-29 ENCOUNTER — Inpatient Hospital Stay (HOSPITAL_COMMUNITY): Payer: MEDICAID

## 2012-08-29 DIAGNOSIS — E119 Type 2 diabetes mellitus without complications: Secondary | ICD-10-CM

## 2012-08-29 LAB — LIPID PANEL
Cholesterol: 152 mg/dL (ref 0–200)
LDL Cholesterol: 95 mg/dL (ref 0–99)
Triglycerides: 192 mg/dL — ABNORMAL HIGH (ref <150)

## 2012-08-29 LAB — GLUCOSE, CAPILLARY
Glucose-Capillary: 196 mg/dL — ABNORMAL HIGH (ref 70–99)
Glucose-Capillary: 234 mg/dL — ABNORMAL HIGH (ref 70–99)

## 2012-08-29 LAB — CBC
HCT: 23.6 % — ABNORMAL LOW (ref 39.0–52.0)
Hemoglobin: 8.1 g/dL — ABNORMAL LOW (ref 13.0–17.0)
MCH: 30.3 pg (ref 26.0–34.0)
MCHC: 34.2 g/dL (ref 30.0–36.0)
Platelets: 274 10*3/uL (ref 150–400)
RBC: 2.69 MIL/uL — ABNORMAL LOW (ref 4.22–5.81)
RBC: 2.51 MIL/uL — ABNORMAL LOW (ref 4.22–5.81)
RDW: 16 % — ABNORMAL HIGH (ref 11.5–15.5)
WBC: 11.9 10*3/uL — ABNORMAL HIGH (ref 4.0–10.5)

## 2012-08-29 LAB — BASIC METABOLIC PANEL
Calcium: 8.5 mg/dL (ref 8.4–10.5)
GFR calc Af Amer: >90 mL/min (ref >90)
GFR calc non Af Amer: 78 mL/min — ABNORMAL LOW (ref >90)
Potassium: 3.7 mEq/L (ref 3.5–5.1)
Sodium: 133 mEq/L — ABNORMAL LOW (ref 135–145)

## 2012-08-29 MED ORDER — INSULIN GLARGINE 100 UNIT/ML ~~LOC~~ SOLN
5.0000 [IU] | Freq: Every day | SUBCUTANEOUS | Status: DC
  Administered 2012-08-29 – 2012-08-30 (×2): 5 [IU] via SUBCUTANEOUS

## 2012-08-29 MED ORDER — CARVEDILOL 6.25 MG PO TABS
6.2500 mg | ORAL_TABLET | Freq: Two times a day (BID) | ORAL | Status: DC
  Administered 2012-08-29 – 2012-08-31 (×6): 6.25 mg via ORAL
  Filled 2012-08-29 (×7): qty 1

## 2012-08-29 MED ORDER — LISINOPRIL 10 MG PO TABS
10.0000 mg | ORAL_TABLET | Freq: Every day | ORAL | Status: DC
  Administered 2012-08-29: 10 mg via ORAL
  Filled 2012-08-29 (×2): qty 1

## 2012-08-29 MED ORDER — NA FERRIC GLUC CPLX IN SUCROSE 12.5 MG/ML IV SOLN
250.0000 mg | Freq: Once | INTRAVENOUS | Status: AC
  Administered 2012-08-29: 250 mg via INTRAVENOUS
  Filled 2012-08-29: qty 20

## 2012-08-29 MED ORDER — SODIUM CHLORIDE 0.9 % IV SOLN
125.0000 mg | Freq: Once | INTRAVENOUS | Status: DC
  Filled 2012-08-29: qty 10

## 2012-08-29 NOTE — Progress Notes (Addendum)
[facility] PROGRESS NOTE  Jose Petersen MVH:[id number] DOB: Dec 01, 1966 DOA: 08/27/2012 PCP: No primary provider on file.  Assessment/Plan: 1-Acute blood loss anemia 2/2 duodenal ulcer: after 4 units of PRBC's given on admission Hgb 8.1; no further bleeding appreciated. Per GI recommendations, advance diet to CL; avoid anticoagulation therapy and NSAID's and continue protonix drip for 24hrs. Patient hemodynamically stable at this moment; will transfer to telemetry.  2-Elevated troponin: no CP or SOB. Believe is 2/2 demand ischemia due to severe anemia. Troponin trending down and no CP or SOB. 2-D echo results demonstrating mild global hypokinesis and mild reduction on EF.Patient will benefit of stress test and probably cath in the future; especially with risk factors (DM, HLD, HTN). Start low dose B-blocker, lo dose ACE inhibitors and continue statins.  3-Upper GI bleed: per GI; will continue protonix drip for another 24 hours, diet advance to full liquid and continue avoiding ASA, heparin products or NSAID's. Will check H. Pylori serology.  4-Leukocytosis: reactive leukocytosis, due to stress and demargination. No source of infection identified. WBC's trending down.  5-Encephalopathy acute: associated with acute blood loss anemia most likely. No infection appreciated. After transfusion and hemodynamically stability achieved mentation is back to baseline.  6-DM (diabetes mellitus): A1C 7.5; will continue SSI and now that his diet is advance to full liquid will add lantus at night for better coverage. At discharge will use a combination of metformin and glipizide.  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 After discussing with Dr. Jens Som; plan is to start low dose b-blocker, statins and low dose ACE inhibitors; no invasive procedures anticipated at this point or ASA due to acute setting of bleeding. Agree with  assessment with stress test and probably cath in near future as an outpatient.  8-HTN: will resume lisinopril; no further active bleeding appreciated.  DVT: SCD's  Code Status: Full Family Communication: mother at bedside Disposition Plan: home when medically stable   Brief narrative: 46 year old male with history of hypertension, dyslipidemia, morbid obesity who presented to ED with complaints of feeling dizzy and has noticed blood in stool one day prior to this admission associated with poor oral intake and nausea but no vomiting. Patient denies previous similar symptoms. Patient specifically reports no chest pain, no shortness of breath, no palpitation. No complaints of abdominal pain.    Consultants:  PCCM  GI  Procedures:  EGD (positive duodenal ulcer)  Cardiology (Dr. Jens Som)  Antibiotics:  none  HPI/Subjective: No CP, no SOB, no fever. Patient without any further bleeding episodes overnight. Vital signs stable, except for mild hypertension. No N/V or abd pain  Objective: Filed Vitals:   08/28/12 2320 08/29/12 0000 08/29/12 0100 08/29/12 0400  BP: 164/92 142/76  144/80  Pulse:  83  81  Temp:  98.1 F (36.7 C)  98.6 F (37 C)  TempSrc:  Oral  Oral  Resp:  22  20  Height:      Weight:   110.8 kg (244 lb 4.3 oz)   SpO2:  98%  97%    Intake/Output Summary (Last 24 hours) at 08/29/12 0829 Last data filed at 08/29/12 0600  Gross per 24 hour  Intake 3819.25 ml  Output    450 ml  Net 3369.25 ml   Filed Weights   08/27/12 2103 08/29/12 0100  Weight: 110.8 kg (244 lb 4.3 oz) 110.8 kg (244 lb 4.3 oz)    Exam:  General:  No acute distress; no nausea. No vomiting  Cardiovascular: RRR, no rubs or gallops  Respiratory: CTA,   Abdomen: soft, NT, ND positive BS  Extremities: trace edema bilaterally  Neuro:AAOX3; no focal deficit  Data Reviewed: Basic Metabolic Panel:  Lab 08/29/12 1610 08/28/12 0420 08/27/12 1711 08/27/12 1702  NA 133* 138 139  138  K 3.7 4.2 4.6 4.7  CL 102 106 108 103  CO2 20 21 -- 21  GLUCOSE 183* 198* 249* 258*  BUN 26* 36* 36* 33*  CREATININE 1.10 1.18 1.20 1.08  CALCIUM 8.5 8.6 -- 8.8  MG -- 2.1 -- --  PHOS -- 3.7 -- --   Liver Function Tests:  Lab 08/28/12 0420 08/27/12 1702  AST 10 13  ALT 11 13  ALKPHOS 64 72  BILITOT 0.7 0.3  PROT 5.2* 5.8*  ALBUMIN 2.7* 3.1*   CBC:  Lab 08/29/12 0701 08/28/12 2240 08/28/12 1559 08/28/12 1020 08/28/12 0420 08/27/12 1702  WBC 11.9* 10.4 12.3* 12.4* 11.6* --  NEUTROABS -- -- -- -- 9.1* 12.3*  HGB 8.1* 7.9* 8.0* 7.3* 7.3* --  HCT 23.6* 23.2* 23.1* 21.0* 20.6* --  MCV 87.7 87.2 86.5 87.9 86.9 --  PLT 270 259 267 266 260 --   Cardiac Enzymes:  Lab 08/28/12 1020 08/28/12 0420 08/27/12 1712  CKTOTAL -- -- --  CKMB -- -- --  CKMBINDEX -- -- --  TROPONINI 0.46* 0.59* 0.32*   BNP (last 3 results)  Basename 08/28/12 0420  PROBNP 5097.0*   CBG:  Lab 08/28/12 2134 08/28/12 1526 08/28/12 1107 08/28/12 0033  GLUCAP 152* 205* 263* 195*    Recent Results (from the past 240 hour(s))  URINE CULTURE     Status: Normal   Collection Time   08/27/12  7:01 PM      Component Value Range Status Comment   Specimen Description URINE, CATHETERIZED   Final    Special Requests NONE   Final    Culture  Setup Time 08/28/2012 01:13   Final    Colony Count NO GROWTH   Final    Culture NO GROWTH   Final    Report Status 08/28/2012 FINAL   Final   MRSA PCR SCREENING     Status: Normal   Collection Time   08/27/12  9:31 PM      Component Value Range Status Comment   MRSA by PCR NEGATIVE  NEGATIVE Final      Studies: Dg Chest 2 View  08/27/2012  *RADIOLOGY REPORT*  Clinical Data: GI bleed.  Weakness, shortness of breath.  CHEST - 2 VIEW  Comparison: No priors.  Findings: Lung volumes are low.  No consolidative airspace disease. No pleural effusions.  Mild cardiomegaly. The patient is rotated to the right on today's exam, resulting in distortion of the mediastinal  contours and reduced diagnostic sensitivity and specificity for mediastinal pathology.  Status post median sternotomy.  IMPRESSION: 1.  Mild cardiomegaly. 2.  Low lung volumes without definite radiographic evidence of acute cardiopulmonary disease. 3.  Status post median sternotomy.   Original Report Authenticated By: Florencia Reasons, M.D.    Dg Tibia/fibula Left  08/29/2012  *RADIOLOGY REPORT*  Clinical Data: Fall, left lower extremity swelling.  LEFT TIBIA AND FIBULA - 2 VIEW  Comparison: None.  Findings: No fracture of the proximal, mid, or distal tibia.  There is degenerative change at the knee joint.  Fibula is normal.  IMPRESSION: No evidence of tibial fracture.   Original Report Authenticated  By: Genevive Bi, M.D.    Dg Chest Port 1 View  08/29/2012  *RADIOLOGY REPORT*  Clinical Data: Atelectasis  PORTABLE CHEST - 1 VIEW  Comparison: Prior chest x-ray yesterday, 08/28/2012  Findings: No significant interval change in the appearance of the lungs.  Mild bilateral retrocardiac opacities most consistent with subsegmental atelectasis.  Negative for edema, or focal airspace consolidation.  No pleural effusion or pneumothorax.  Unchanged cardiomegaly.  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  Fractured sternotomy wires are unchanged.  IMPRESSION:  No significant interval change in the appearance of the lungs with minimal bibasilar subsegmental atelectasis and stable cardiomegaly.   Original Report Authenticated By: Alvino Blood Chest Port 1 View  08/28/2012  *RADIOLOGY REPORT*  Clinical Data: Pulmonary edema.  PORTABLE CHEST - 1 VIEW  Comparison: 08/27/2012.  Findings: Cardiomegaly.  Median sternotomy, presumably for CABG. Basilar atelectasis is present.  No airspace disease.  No effusion. No convincing evidence of pulmonary edema. Monitoring leads are projected over the chest.  IMPRESSION: Unchanged cardiomegaly without evidence of failure.  Basilar atelectasis.    Original Report Authenticated By: Andreas Newport, M.D.     Scheduled Meds:    . atorvastatin  20 mg Oral q1800  . fentaNYL  25 mcg Intravenous Once  . furosemide  20 mg Intravenous Once  . hydrALAZINE  10 mg Intravenous Once  . insulin aspart  0-9 Units Subcutaneous TID WC  . sodium chloride  3 mL Intravenous Q12H  . sodium chloride       Continuous Infusions:    . sodium chloride 75 mL/hr at 08/28/12 1503  . pantoprozole (PROTONIX) infusion 8 mg/hr (08/29/12 0142)  . DISCONTD: sodium chloride      Time spent: > 30 minutes     Jamiesha Victoria  [facility] Pager [phone]. If 8PM-8AM, please contact night-coverage at [email or web address], password [email or web address] 08/29/2012, 8:29 AM  LOS: 2 days

## 2012-08-29 NOTE — Progress Notes (Signed)
Pt B/P 163/105 and he also c/o pain in his LLE (which slightly swollen), on call NP paged and made aware to F/U. Conley Rolls RN

## 2012-08-29 NOTE — Progress Notes (Signed)
Patient ID: Jose Petersen, male   DOB: 11-18-1966, 46 y.o.   MRN: [id number] [facility] Progress Note  Jose Petersen 46 y.o. [birth year]   Subjective: No bleeding overnight. No BMs. Feels good. Sitting up in bed reading newspaper. EGD showed large duodenal ulcer with evidence of recent bleeding.  Objective: Vital signs in last 24 hours: Filed Vitals:   08/29/12 0400  BP: 144/80  Pulse: 81  Temp: 98.6 F (37 C)  Resp: 20    Physical Exam: Gen: alert, no acute distress Abd: soft, NT, ND, +BS  Lab Results:  [facility] 08/29/12 0701 08/28/12 0420  NA 133* 138  K 3.7 4.2  CL 102 106  CO2 20 21  GLUCOSE 183* 198*  BUN 26* 36*  CREATININE 1.10 1.18  CALCIUM 8.5 8.6  MG -- 2.1  PHOS -- 3.7    Basename 08/28/12 0420 08/27/12 1702  AST 10 13  ALT 11 13  ALKPHOS 64 72  BILITOT 0.7 0.3  PROT 5.2* 5.8*  ALBUMIN 2.7* 3.1*    Basename 08/29/12 0701 08/28/12 2240 08/28/12 0420 08/27/12 1702  WBC 11.9* 10.4 -- --  NEUTROABS -- -- 9.1* 12.3*  HGB 8.1* 7.9* -- --  HCT 23.6* 23.2* -- --  MCV 87.7 87.2 -- --  PLT 270 259 -- --    Basename 08/28/12 0420 08/27/12 1702  LABPROT 14.3 14.0  INR 1.13 1.09      Assessment/Plan: S/P upper GI bleed due to duodenal ulcer likely caused by NSAIDs. Hgb stable and no evidence of ongoing bleeding. Continue Protonix infusion and consider changing to IV PPI Q 12hours tomorrow if stable. Follow H/Hs. Change to full liquid diet. Check H. Pylori serology and treat if positive. Will follow.   Jireh Vinas C. 08/29/2012, 8:32 AM

## 2012-08-30 DIAGNOSIS — D649 Anemia, unspecified: Secondary | ICD-10-CM

## 2012-08-30 LAB — GLUCOSE, CAPILLARY: Glucose-Capillary: 190 mg/dL — ABNORMAL HIGH (ref 70–99)

## 2012-08-30 LAB — CBC
HCT: 22.5 % — ABNORMAL LOW (ref 39.0–52.0)
Hemoglobin: 7.6 g/dL — ABNORMAL LOW (ref 13.0–17.0)
MCH: 30.2 pg (ref 26.0–34.0)
MCHC: 33.8 g/dL (ref 30.0–36.0)

## 2012-08-30 LAB — PREPARE RBC (CROSSMATCH)

## 2012-08-30 MED ORDER — PANTOPRAZOLE SODIUM 40 MG IV SOLR
40.0000 mg | Freq: Two times a day (BID) | INTRAVENOUS | Status: DC
  Administered 2012-08-30 (×2): 40 mg via INTRAVENOUS
  Filled 2012-08-30 (×4): qty 40

## 2012-08-30 MED ORDER — LISINOPRIL 20 MG PO TABS
20.0000 mg | ORAL_TABLET | Freq: Every day | ORAL | Status: DC
  Administered 2012-08-30 – 2012-08-31 (×2): 20 mg via ORAL
  Filled 2012-08-30 (×3): qty 1

## 2012-08-30 MED ORDER — FUROSEMIDE 10 MG/ML IJ SOLN
20.0000 mg | Freq: Once | INTRAMUSCULAR | Status: AC
  Administered 2012-08-30: 20 mg via INTRAVENOUS
  Filled 2012-08-30: qty 2

## 2012-08-30 NOTE — Progress Notes (Signed)
[facility] PROGRESS NOTE  Jose Petersen ZOX:[id number] DOB: [birth year] DOA: 08/27/2012 PCP: No primary provider on file.  Assessment/Plan: 1-Acute blood loss anemia 2/2 duodenal ulcer: after 4 units of PRBC's given on admission Hgb 7.6 today; no further bleeding appreciated. Per GI recommendations, avoid anticoagulation therapy and NSAID's and continue protonix. Patient hemodynamically stable. Due to Hgb been < than 8 today and recent elevated troponin and probably demand ischemia, will transfuse 1 more unit today and follow Hgb trend. Gi to decide in further diet advancement and when to change Protonix to PO.  2-Elevated troponin: no CP or SOB. Believe is 2/2 demand ischemia due to severe anemia. Troponin trending down and no CP or SOB. 2-D echo results demonstrating mild global hypokinesis and mild reduction on EF. Patient will benefit of stress test and probably cath in the future; especially with risk factors (DM, HLD, HTN). Will continue low dose B-blocker, low dose ACE inhibitors and continue statins.  3-Upper GI bleed: per GI; will most likely change protonix to PO BID today; but will wait GI approval and recommendations. No further bleeding reported; but Hgb <8.0; with recent elevated troponin and most likely demand ischemia, will give 1 unit of blood and will try to keep Hgb >8.0. H. Pylori serology in process, will treat if positive.  4-Leukocytosis: reactive leukocytosis, due to stress and demargination. No source of infection identified. WBC's WNL now.  5-Encephalopathy acute: associated with acute blood loss anemia most likely. No infection appreciated. After transfusion and hemodynamically stability achieved mentation is back to baseline.  6-DM (diabetes mellitus): A1C 7.5; will continue SSI and lantus. CBG now stable. At discharge will use a combination of metformin and glipizide.  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 Case discussed with Dr. Jens Som, cardiologist; plan is to continue dose b-blocker, statins and low dose ACE inhibitors; no invasive procedures anticipated at this point or ASA due to acute setting of bleeding. Will arrange at discharge outpatient follow up with [facility] cardiology for stress test and probably cath in near future as an outpatient.  8-HTN: improved. will continue lisinopril and low dose beta blocker.  DVT: SCD's  Code Status: Full Family Communication: mother at bedside Disposition Plan: home when medically stable   Brief narrative: 46 year old male with history of hypertension, dyslipidemia, morbid obesity who presented to ED with complaints of feeling dizzy and has noticed blood in stool one day prior to this admission associated with poor oral intake and nausea but no vomiting. Patient denies previous similar symptoms. Patient specifically reports no chest pain, no shortness of breath, no palpitation. No complaints of abdominal pain.    Consultants:  PCCM  GI  Procedures:  EGD (positive duodenal ulcer)  Cardiology (Dr. Jens Som)  Antibiotics:  none  HPI/Subjective: No CP, no SOB, no fever. Patient also denies N/V and abdominal pain. Tolerating full liquid diet. No further episodes of blood in his stool. Still complaining of pain on his left ankle.  Objective: Filed Vitals:   08/29/12 1025 08/29/12 1556 08/29/12 2310 08/30/12 0459  BP: 172/91 132/74 123/73 155/88  Pulse: 91 78 73 78  Temp: 97.6 F (36.4 C) 97.2 F (36.2 C) 98.4 F (36.9 C) 97.5 F (36.4 C)  TempSrc: Oral Oral Oral Oral  Resp: 20 18 20 18   Height:      Weight:    110.7 kg (244 lb 0.8 oz)  SpO2: 100% 100% 100%  100%    Intake/Output Summary (Last 24 hours) at 08/30/12 0911 Last data filed at 08/30/12 0708  Gross per 24 hour  Intake 2514.58 ml  Output      0 ml  Net 2514.58 ml   Filed Weights   08/27/12 2103 08/29/12 0100 08/30/12 0459  Weight: 110.8  kg (244 lb 4.3 oz) 110.8 kg (244 lb 4.3 oz) 110.7 kg (244 lb 0.8 oz)    Exam:   General:  No acute distress; no nausea. No vomiting. Denies abdominal pain  Cardiovascular: RRR, no rubs or gallops; no murmurs  Respiratory: CTA,   Abdomen: soft, NT, ND and with positive BS  Extremities: trace edema bilaterally; left ankle pain  Neuro:AAOX3; no focal deficit  Data Reviewed: Basic Metabolic Panel:  Lab 08/29/12 1610 08/28/12 0420 08/27/12 1711 08/27/12 1702  NA 133* 138 139 138  K 3.7 4.2 4.6 4.7  CL 102 106 108 103  CO2 20 21 -- 21  GLUCOSE 183* 198* 249* 258*  BUN 26* 36* 36* 33*  CREATININE 1.10 1.18 1.20 1.08  CALCIUM 8.5 8.6 -- 8.8  MG -- 2.1 -- --  PHOS -- 3.7 -- --   Liver Function Tests:  Lab 08/28/12 0420 08/27/12 1702  AST 10 13  ALT 11 13  ALKPHOS 64 72  BILITOT 0.7 0.3  PROT 5.2* 5.8*  ALBUMIN 2.7* 3.1*   CBC:  Lab 08/30/12 0729 08/29/12 1910 08/29/12 0701 08/28/12 2240 08/28/12 1559 08/28/12 0420 08/27/12 1702  WBC 10.5 9.7 11.9* 10.4 12.3* -- --  NEUTROABS -- -- -- -- -- 9.1* 12.3*  HGB 7.6* 7.6* 8.1* 7.9* 8.0* -- --  HCT 22.5* 22.2* 23.6* 23.2* 23.1* -- --  MCV 89.3 88.4 87.7 87.2 86.5 -- --  PLT 277 274 270 259 267 -- --   Cardiac Enzymes:  Lab 08/28/12 1020 08/28/12 0420 08/27/12 1712  CKTOTAL -- -- --  CKMB -- -- --  CKMBINDEX -- -- --  TROPONINI 0.46* 0.59* 0.32*   BNP (last 3 results)  Basename 08/28/12 0420  PROBNP 5097.0*   CBG:  Lab 08/30/12 0819 08/29/12 2120 08/29/12 1717 08/29/12 1504 08/29/12 0742  GLUCAP 133* 157* 195* 234* 196*    Recent Results (from the past 240 hour(s))  URINE CULTURE     Status: Normal   Collection Time   08/27/12  7:01 PM      Component Value Range Status Comment   Specimen Description URINE, CATHETERIZED   Final    Special Requests NONE   Final    Culture  Setup Time 08/28/2012 01:13   Final    Colony Count NO GROWTH   Final    Culture NO GROWTH   Final    Report Status 08/28/2012 FINAL    Final   MRSA PCR SCREENING     Status: Normal   Collection Time   08/27/12  9:31 PM      Component Value Range Status Comment   MRSA by PCR NEGATIVE  NEGATIVE Final      Studies: Dg Tibia/fibula Left  08/29/2012  *RADIOLOGY REPORT*  Clinical Data: Fall, left lower extremity swelling.  LEFT TIBIA AND FIBULA - 2 VIEW  Comparison: None.  Findings: No fracture of the proximal, mid, or distal tibia.  There is degenerative change at the knee joint.  Fibula is normal.  IMPRESSION: No evidence of tibial fracture.   Original Report Authenticated By: Genevive Bi, M.D.    Dg Chest Port 1 View  08/29/2012  *  RADIOLOGY REPORT*  Clinical Data: Atelectasis  PORTABLE CHEST - 1 VIEW  Comparison: Prior chest x-ray yesterday, 08/28/2012  Findings: No significant interval change in the appearance of the lungs.  Mild bilateral retrocardiac opacities most consistent with subsegmental atelectasis.  Negative for edema, or focal airspace consolidation.  No pleural effusion or pneumothorax.  Unchanged cardiomegaly.  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  Fractured sternotomy wires are unchanged.  IMPRESSION:  No significant interval change in the appearance of the lungs with minimal bibasilar subsegmental atelectasis and stable cardiomegaly.   Original Report Authenticated By: Vilma Prader     Scheduled Meds:    . atorvastatin  20 mg Oral q1800  . carvedilol  6.25 mg Oral BID WC  . ferric gluconate (FERRLECIT/NULECIT) IV  250 mg Intravenous Once  . furosemide  20 mg Intravenous Once  . insulin aspart  0-9 Units Subcutaneous TID WC  . insulin glargine  5 Units Subcutaneous QHS  . lisinopril  20 mg Oral Daily  . sodium chloride  3 mL Intravenous Q12H  . DISCONTD: ferric gluconate (FERRLECIT/NULECIT) IV  125 mg Intravenous Once  . DISCONTD: lisinopril  10 mg Oral Daily   Continuous Infusions:    . sodium chloride 75 mL/hr at 08/30/12 0459  . pantoprozole (PROTONIX) infusion 8 mg/hr  (08/30/12 0007)    Time spent: > 30 minutes     Jose Petersen  [facility] Pager [phone]. If 8PM-8AM, please contact night-coverage at [email or web address], password [facility] 08/30/2012, 9:11 AM  LOS: 3 days

## 2012-08-30 NOTE — Progress Notes (Signed)
Patient ID: Jose Petersen, male   DOB: 1966/11/01, 46 y.o.   MRN: [id number] [facility] Progress Note  Jigar Lupold 46 y.o. [birth year]   Subjective: Hungry. No BMs. Hgb 7.6 (8.1). Denies abdominal pain/N/V. Sitting up in bed shaving.  Objective: Vital signs in last 24 hours: Filed Vitals:   08/30/12 0459  BP: 155/88  Pulse: 78  Temp: 97.5 F (36.4 C)  Resp: 18    Physical Exam: Gen: alert, no acute distress Abd: soft, nontender, nondistended  Lab Results:  [facility] 08/29/12 0701 08/28/12 0420  NA 133* 138  K 3.7 4.2  CL 102 106  CO2 20 21  GLUCOSE 183* 198*  BUN 26* 36*  CREATININE 1.10 1.18  CALCIUM 8.5 8.6  MG -- 2.1  PHOS -- 3.7    Basename 08/28/12 0420 08/27/12 1702  AST 10 13  ALT 11 13  ALKPHOS 64 72  BILITOT 0.7 0.3  PROT 5.2* 5.8*  ALBUMIN 2.7* 3.1*    Basename 08/30/12 0729 08/29/12 1910 08/28/12 0420 08/27/12 1702  WBC 10.5 9.7 -- --  NEUTROABS -- -- 9.1* 12.3*  HGB 7.6* 7.6* -- --  HCT 22.5* 22.2* -- --  MCV 89.3 88.4 -- --  PLT 277 274 -- --    Basename 08/28/12 0420 08/27/12 1702  LABPROT 14.3 14.0  INR 1.13 1.09      Assessment/Plan: 46yo s/p duodenal ulcer bleed. Decrease in Hgb likely dilutional but reasonable to give a unit of blood. No evidence of ongoing bleeding. Will advance diet. Changing to IV PPI Q 12hours and stopping Protonix continuous infusion. F/U on H. Pylori serology. If stable tomorrow and eating ok then can go home Monday from GI standpoint.   Chael Urenda C. 08/30/2012, 11:12 AM

## 2012-08-31 ENCOUNTER — Encounter (HOSPITAL_COMMUNITY): Payer: Self-pay | Admitting: Gastroenterology

## 2012-08-31 LAB — CBC
MCH: 30.3 pg (ref 26.0–34.0)
MCHC: 34.2 g/dL (ref 30.0–36.0)
RDW: 16.1 % — ABNORMAL HIGH (ref 11.5–15.5)

## 2012-08-31 LAB — GLUCOSE, CAPILLARY: Glucose-Capillary: 139 mg/dL — ABNORMAL HIGH (ref 70–99)

## 2012-08-31 LAB — TYPE AND SCREEN
ABO/RH(D): AB POS
Antibody Screen: NEGATIVE
Unit division: 0
Unit division: 0

## 2012-08-31 LAB — H. PYLORI ANTIBODY, IGG: H Pylori IgG: <0.4 {ISR}

## 2012-08-31 LAB — BASIC METABOLIC PANEL
BUN: 20 mg/dL (ref 6–23)
Calcium: 8.6 mg/dL (ref 8.4–10.5)
GFR calc Af Amer: 74 mL/min — ABNORMAL LOW (ref >90)
GFR calc non Af Amer: 64 mL/min — ABNORMAL LOW (ref >90)
Potassium: 3.4 mEq/L — ABNORMAL LOW (ref 3.5–5.1)
Sodium: 135 mEq/L (ref 135–145)

## 2012-08-31 LAB — HELICOBACTER PYLORI ABS-IGG+IGA, BLD: H Pylori IgA: 1.9 U/mL (ref <9.0)

## 2012-08-31 MED ORDER — FREESTYLE LANCETS MISC: Status: DC

## 2012-08-31 MED ORDER — FERROUS GLUCONATE 216 MG PO TABS: 216.0000 mg | ORAL_TABLET | Freq: Three times a day (TID) | ORAL | Status: AC

## 2012-08-31 MED ORDER — PANTOPRAZOLE SODIUM 40 MG PO TBEC
40.0000 mg | DELAYED_RELEASE_TABLET | Freq: Two times a day (BID) | ORAL | Status: DC
  Filled 2012-08-31: qty 1

## 2012-08-31 MED ORDER — HYDROCODONE-ACETAMINOPHEN 5-325 MG PO TABS: 1.0000 | ORAL_TABLET | Freq: Four times a day (QID) | ORAL | Status: DC | PRN

## 2012-08-31 MED ORDER — PANTOPRAZOLE SODIUM 40 MG PO TBEC: 40.0000 mg | DELAYED_RELEASE_TABLET | Freq: Two times a day (BID) | ORAL | Status: DC

## 2012-08-31 MED ORDER — FREESTYLE SYSTEM KIT: 1.0000 | PACK | Status: DC | PRN

## 2012-08-31 MED ORDER — GLUCOSE BLOOD VI STRP: ORAL_STRIP | Status: DC

## 2012-08-31 MED ORDER — CARVEDILOL 6.25 MG PO TABS: 6.2500 mg | ORAL_TABLET | Freq: Two times a day (BID) | ORAL | Status: DC

## 2012-08-31 MED ORDER — METFORMIN HCL 500 MG PO TABS: 500.0000 mg | ORAL_TABLET | Freq: Two times a day (BID) | ORAL | Status: DC

## 2012-08-31 NOTE — Progress Notes (Signed)
Subjective: No pain. No bowel movement past couple days. Tolerating full liquid diet.  Objective: Vital signs in last 24 hours: Temp:  [97.6 F (36.4 C)-98.4 F (36.9 C)] 97.8 F (36.6 C) (09/30 0539) Pulse Rate:  [68-73] 72  (09/30 0539) Resp:  [16-18] 18  (09/30 0539) BP: (110-143)/(59-87) 139/87 mmHg (09/30 0539) SpO2:  [100 %] 100 % (09/30 0539) Weight change:  Last BM Date: 08/29/12  PE: GEN:  NAD ABD:  Soft  Lab Results: CBC    Component Value Date/Time   WBC 13.7* 08/31/2012 0437   RBC 3.10* 08/31/2012 0437   HGB 9.4* 08/31/2012 0437   HCT 27.5* 08/31/2012 0437   PLT 351 08/31/2012 0437   MCV 88.7 08/31/2012 0437   MCH 30.3 08/31/2012 0437   MCHC 34.2 08/31/2012 0437   RDW 16.1* 08/31/2012 0437   LYMPHSABS 1.5 08/28/2012 0420   MONOABS 0.9 08/28/2012 0420   EOSABS 0.1 08/28/2012 0420   BASOSABS 0.0 08/28/2012 0420   Assessment:  1.  Bleeding duodenal ulcer.  No further clinical bleeding.  Likely NSAID-related. 2.  Acute blood loss anemia, per #1 above. Received an additional 2 units PRBCs yesterday.  Plan:  1.  Change Protonix to 40 mg po bid, and continue x 12 weeks. 2.  No NSAIDs. 3.  Advance diet. 4.  Still awaiting H. Pylori serologies. 5.  OK to discharge this evening versus tomorrow morning, if Hgb is stable. 6.  Will sign-off and will make outpatient follow-up arrangements with [facility].  Thank you for the consult.   Jose Petersen 08/31/2012, 8:42 AM

## 2012-08-31 NOTE — Progress Notes (Addendum)
Nutrition Follow up  Diet advanced to CHO MOD.  Tolerating well.  Hungry.  Wt:  240#.  Pt reported losing 60-70# in the last 6-7 months.  Went to the [employer] almost daily.  Lives with mother since death of father.  Mother cooks.  Pt follows low fat diabetic diet.  HGBA1C=7.5  Instructed pt on CHO MOD diet.  Encouraged further weight loss, exercise, portion control, consistent CHO.  Protein with each meal.  Written info with RD name and number provided.  Mom to call if questions.  Pt able to state guidelines of a healthy diabetic diet, portion control.   Followed up with pt's mother.  She requested info on diet for ulcer.  Written info provided.  Oran Rein, RD, LDN Clinical Inpatient Dietitian Pager:  [phone] Weekend and after hours pager:  [phone]

## 2012-08-31 NOTE — Progress Notes (Signed)
Inpatient Diabetes Program Recommendations  AACE/ADA: New Consensus Statement on Inpatient Glycemic Control  Target Ranges:  Prepandial:   less than 140 mg/dL      Peak postprandial:   less than 180 mg/dL (1-2 hours)      Critically ill patients:  140 - 180 mg/dL  Pager:  [phone] Hours:  8 am-10pm   Reason for Visit: Elevated HgbA1C: 7.5%  Inpatient Diabetes Program Recommendations Oral Agents: Please address outpatient DM regimen.  Patient currentlly on no home medications for DM.  Consider adding Metformin due to elevated HgbA1c of 7.5%  Alfredia Client PhD, RN, BC-ADM Diabetes Coordinator  Office:  [phone] Team Pager:  [phone]

## 2012-08-31 NOTE — Discharge Summary (Signed)
Physician Discharge Summary  Logen Kyne ZOX:[id number] DOB: 1966/02/22 DOA: 08/27/2012  PCP: No primary provider on file.  Admit date: 08/27/2012 Discharge date: 08/31/2012  Recommendations for Outpatient Follow-up:  1. Follow up with PCP (follow CBG's at the office and adjust hypoglycemic regimens; also follow Hgb trend. patient needs follow up with cardiology and with GI as an outpatient)  Discharge Diagnoses:  Principal Problem:  *Acute blood loss anemia Active Problems:  Elevated troponin  Upper GI bleed  Leukocytosis  Encephalopathy acute  DM (diabetes mellitus)   Discharge Condition: stable and improved; no further bleeding and tolerating diet. Will follow discharge instructions and medications as prescribed.  Diet recommendation: low carbohydrates, low fat and low sodium diet  Filed Weights   08/27/12 2103 08/29/12 0100 08/30/12 0459  Weight: 110.8 kg (244 lb 4.3 oz) 110.8 kg (244 lb 4.3 oz) 110.7 kg (244 lb 0.8 oz)    History of present illness:  46 year old male with history of hypertension, dyslipidemia, morbid obesity who presented to ED with complaints of feeling dizzy and has noticed blood in stool one day prior to this admission associated with poor oral intake and nausea but no vomiting. Patient denies previous similar symptoms. Patient specifically reports no chest pain, no shortness of breath, no palpitation. No complaints of abdominal pain.    Hospital Course:  1-Acute blood loss anemia 2/2 duodenal ulcer: Patient received a total of 5 units PRBC's and at discharge Hgb 9.4; no further bleeding appreciated. Per GI recommendations, avoid NSAID's and continue protonix BID. They will arrange follow up as an outpatient to follow and treat if needed H. Pylori infection and repeat EGD at some point to follow stability of this ulcer. Will discharge patient on ferrous gluconate TId and he will follow with PCP for follow up of his Hgb trend.  2-Elevated troponin: no CP or  SOB. Believe is 2/2 demand ischemia due to severe anemia. Troponin after patient becomes hemodynamically stable trend down and never experienced CP or SOB. 2-D echo results demonstrated mild global hypokinesis and mild reduction on EF (50%). Patient will benefit of stress test and probably cath in the future; especially with risk factors (DM, HLD, HTN). Per cardiology recommendations will discharge on low dose B-blocker, low dose ACE inhibitors and continue statins. No ASA due to GI bleed.  3-Upper GI bleed: H. Pylori serology in process, will be follow by [facility] and they will provide treatment if positive. Further recommendations and plan as mentioned above.  4-Leukocytosis: reactive leukocytosis, due to stress and demargination. No source of infection identified. WBC's WNL now.   5-Encephalopathy acute: associated with acute blood loss anemia most likely. No infection appreciated. After transfusion and hemodynamically stability achieved mentation is back to baseline.   6-DM (diabetes mellitus): A1C 7.5; Patient discharged on metformin BID; will follow with PCP for further medication adjustment and follow up. Will also follow a low carb diet.   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 Case discussed with Dr. Jens Som, cardiologist; plan is to continue dose b-blocker, statins and low dose ACE inhibitors; no invasive procedures anticipated at this point or ASA due to acute bleeding. Will arrange outpatient follow up with [facility] for stress test and probably cath in near future as an outpatient.   8-HTN: improved. will continue lisinopril and low dose beta blocker.   Procedures:  EGD (positive duodenal ulcer with active bleeding)  Consultations:  GI  CCM  Discharge Exam: Filed Vitals:   08/30/12 1558 08/30/12 2215 08/31/12 0539 08/31/12 1355  BP: 130/79 121/72 139/87 106/64  Pulse: 71 72 72 70    Temp: 97.6 F (36.4 C) 97.8 F (36.6 C) 97.8 F (36.6 C) 97.4 F (36.3 C)  TempSrc: Oral Oral Oral Oral  Resp: 18 18 18 16   Height:      Weight:      SpO2: 100% 100% 100% 100%    General: NAD; no abdominal pain, no nausea or vomiting; tolerating full diet Cardiovascular: RRR, no rubs or gallops, S1 and S2 Respiratory: CTA bilaterally Abdomen: soft, NT, ND, positive BS Extremities: no cyanosis or clubbing; mild left foot pain and trace edema after fall at home. Neuro: non focal deficit  Discharge Instructions  Discharge Orders    Future Orders Please Complete By Expires   Discharge instructions      Comments:   -Take medications as prescribed -Follow a low carbohydrates diet -Keep yourself well hydrated -Over the counter miralax as an outpatient if needed for constipation; can also use prune juice -follow a heart healthy and low fat diet -Follow up with PCP and also GI as instructed -Avoid aspirin, motrin, Advil or any other NSAID's       Medication List     As of 08/31/2012  5:28 PM    TAKE these medications         carvedilol 6.25 MG tablet   Commonly known as: COREG   Take 1 tablet (6.25 mg total) by mouth 2 (two) times daily with a meal.      colchicine 0.6 MG tablet   Take 1 tablet (0.6 mg total) by mouth 2 (two) times daily.      ferrous gluconate 216 MG tablet   Commonly known as: FERGON   Take 1 tablet (216 mg total) by mouth 3 (three) times daily with meals.      freestyle lancets   Use as instructed      glucosamine-chondroitin 500-400 MG tablet   Take 1 tablet by mouth daily.      glucose blood test strip   Use as instructed      glucose monitoring kit monitoring kit   1 each by Does not apply route as needed for other.      HYDROcodone-acetaminophen 5-325 MG per tablet   Commonly known as: NORCO/VICODIN   Take 1-2 tablets by mouth every 6 (six) hours as needed for pain.      lisinopril 20 MG tablet   Commonly known as: PRINIVIL,ZESTRIL    Take 20 mg by mouth daily.      Melatonin 3 MG Caps   Take 1 capsule by mouth at bedtime as needed. For sleep      metFORMIN 500 MG tablet   Commonly known as: GLUCOPHAGE   Take 1 tablet (500 mg total) by mouth 2 (two) times daily with a meal.      pantoprazole 40 MG tablet   Commonly known as: PROTONIX   Take 1 tablet (40 mg total) by mouth 2 (two) times daily before a meal.      rosuvastatin 10 MG tablet   Commonly known as: CRESTOR   Take 10 mg by mouth daily.           Follow-up Information    Call [facility]. (please call office to set up appointment as an outpatient to follow heart findings.)    Contact information:   [address]  [address] [place] [address]           The results of significant diagnostics from this hospitalization (including imaging, microbiology, ancillary and laboratory) are listed below for reference.    Significant Diagnostic Studies: Dg Chest 2 View  08/27/2012  *RADIOLOGY REPORT*  Clinical Data: GI bleed.  Weakness, shortness of breath.  CHEST - 2 VIEW  Comparison: No priors.  Findings: Lung volumes are low.  No consolidative airspace disease. No pleural effusions.  Mild cardiomegaly. The patient is rotated to the right on today's exam, resulting in distortion of the mediastinal contours and reduced diagnostic sensitivity and specificity for mediastinal pathology.  Status post median sternotomy.  IMPRESSION: 1.  Mild cardiomegaly. 2.  Low lung volumes without definite radiographic evidence of acute cardiopulmonary disease. 3.  Status post median sternotomy.   Original Report Authenticated By: Florencia Reasons, M.D.    Dg Tibia/fibula Left  08/29/2012  *RADIOLOGY REPORT*  Clinical Data: Fall, left lower extremity swelling.  LEFT TIBIA AND FIBULA - 2 VIEW  Comparison: None.  Findings: No fracture of the proximal, mid, or distal tibia.  There is degenerative change at the knee joint.  Fibula is normal.  IMPRESSION: No evidence of  tibial fracture.   Original Report Authenticated By: Genevive Bi, M.D.    Dg Chest Port 1 View  08/29/2012  *RADIOLOGY REPORT*  Clinical Data: Atelectasis  PORTABLE CHEST - 1 VIEW  Comparison: Prior chest x-ray yesterday, 08/28/2012  Findings: No significant interval change in the appearance of the lungs.  Mild bilateral retrocardiac opacities most consistent with subsegmental atelectasis.  Negative for edema, or focal airspace consolidation.  No pleural effusion or pneumothorax.  Unchanged cardiomegaly.  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  Fractured sternotomy wires are unchanged.  IMPRESSION:  No significant interval change in the appearance of the lungs with minimal bibasilar subsegmental atelectasis and stable cardiomegaly.   Original Report Authenticated By: Alvino Blood Chest Port 1 View  08/28/2012  *RADIOLOGY REPORT*  Clinical Data: Pulmonary edema.  PORTABLE CHEST - 1 VIEW  Comparison: 08/27/2012.  Findings: Cardiomegaly.  Median sternotomy, presumably for CABG. Basilar atelectasis is present.  No airspace disease.  No effusion. No convincing evidence of pulmonary edema. Monitoring leads are projected over the chest.  IMPRESSION: Unchanged cardiomegaly without evidence of failure.  Basilar atelectasis.   Original Report Authenticated By: Andreas Newport, M.D.     Microbiology: Recent Results (from the past 240 hour(s))  URINE CULTURE     Status: Normal   Collection Time   08/27/12  7:01 PM      Component Value Range Status Comment   Specimen Description URINE, CATHETERIZED   Final    Special Requests NONE   Final    Culture  Setup Time 08/28/2012 01:13   Final    Colony Count NO GROWTH   Final    Culture NO GROWTH   Final    Report Status 08/28/2012 FINAL   Final   MRSA PCR SCREENING     Status: Normal   Collection Time   08/27/12  9:31 PM      Component Value Range Status Comment   MRSA by PCR NEGATIVE  NEGATIVE Final      Labs: Basic  Metabolic Panel:  Lab 08/31/12 4098 08/29/12 0701 08/28/12 0420 08/27/12 1711 08/27/12 1702  NA 135 133* 138 139 138  K 3.4* 3.7 4.2 4.6 4.7  CL 102 102 106 108 103  CO2  22 20 21  -- 21  GLUCOSE 146* 183* 198* 249* 258*  BUN 20 26* 36* 36* 33*  CREATININE 1.30 1.10 1.18 1.20 1.08  CALCIUM 8.6 8.5 8.6 -- 8.8  MG -- -- 2.1 -- --  PHOS -- -- 3.7 -- --   Liver Function Tests:  Lab 08/28/12 0420 08/27/12 1702  AST 10 13  ALT 11 13  ALKPHOS 64 72  BILITOT 0.7 0.3  PROT 5.2* 5.8*  ALBUMIN 2.7* 3.1*   CBC:  Lab 08/31/12 0437 08/30/12 0729 08/29/12 1910 08/29/12 0701 08/28/12 2240 08/28/12 0420 08/27/12 1702  WBC 13.7* 10.5 9.7 11.9* 10.4 -- --  NEUTROABS -- -- -- -- -- 9.1* 12.3*  HGB 9.4* 7.6* 7.6* 8.1* 7.9* -- --  HCT 27.5* 22.5* 22.2* 23.6* 23.2* -- --  MCV 88.7 89.3 88.4 87.7 87.2 -- --  PLT 351 277 274 270 259 -- --   Cardiac Enzymes:  Lab 08/28/12 1020 08/28/12 0420 08/27/12 1712  CKTOTAL -- -- --  CKMB -- -- --  CKMBINDEX -- -- --  TROPONINI 0.46* 0.59* 0.32*   BNP: BNP (last 3 results)  Basename 08/28/12 0420  PROBNP 5097.0*   CBG:  Lab 08/31/12 0751 08/30/12 2212 08/30/12 1636 08/30/12 1308 08/30/12 0819  GLUCAP 139* 211* 193* 190* 133*    Time coordinating discharge: >30 minutes  Signed:  Bailei Buist  [facility] 08/31/2012, 5:28 PM

## 2012-09-01 NOTE — Care Management Note (Signed)
    Page 1 of 2   09/01/2012     1:59:07 PM   CARE MANAGEMENT NOTE 09/01/2012  Patient:  Jose Petersen, Jose Petersen   Account Number:  [id number]  Date Initiated:  08/28/2012  Documentation initiated by:  DAVIS,RHONDA  Subjective/Objective Assessment:   pt with active gi bld from duoden ulcer, hgb 5.0 on admission, ecg showing ischemic changes, egd doen and bleeding controlled.     Action/Plan:   from home   Anticipated DC Date:  08/31/2012   Anticipated DC Plan:  HOME/SELF CARE  In-house referral  NA      DC Planning Services  NA      [facility] Choice  NA   Choice offered to / List presented to:  NA   DME arranged  NA      DME agency  NA     HH arranged  NA      HH agency  NA   Status of service:  Completed, signed off Medicare Important Message given?  NA - LOS <3 / Initial given by admissions (If response is "NO", the following Medicare IM given date fields will be blank) Date Medicare IM given:   Date Additional Medicare IM given:    Discharge Disposition:  HOME/SELF CARE  Per UR Regulation:  Reviewed for med. necessity/level of care/duration of stay  If discussed at Long Length of Stay Meetings, dates discussed:    Comments:  08/31/12 Yuriy Cui RN,BSN NCM [phone] TRANSFER FROM SDU.D/C HOME. [id number] Earlene Plater, RN, BSN, CCM: CHART REVIEWED AND UPDATED. NO DISCHARGE NEEDS PRESENT AT THIS TIME. CASE MANAGEMENT [phone]

## 2012-10-02 HISTORY — PX: CYSTOSCOPY WITH RETROGRADE PYELOGRAM, URETEROSCOPY AND STENT PLACEMENT: SHX5789

## 2012-11-05 ENCOUNTER — Encounter: Payer: Self-pay | Admitting: Physician Assistant

## 2012-11-10 ENCOUNTER — Encounter: Payer: Self-pay | Admitting: Physician Assistant

## 2012-11-10 ENCOUNTER — Telehealth: Payer: Self-pay | Admitting: *Deleted

## 2012-11-10 NOTE — Telephone Encounter (Signed)
lmom on home # ptcb appt scheduled in error. I then called mobile and woman answered and I stated my name and where I was calling from, Mr Jose Petersen then came to the phone. Explained needs NP appt 1st w/doc. [facility] appt to [facility] 12/30/12 ok per D. Mathis,RN

## 2012-12-03 ENCOUNTER — Emergency Department (HOSPITAL_COMMUNITY): Payer: Self-pay

## 2012-12-03 ENCOUNTER — Encounter (HOSPITAL_COMMUNITY): Payer: Self-pay | Admitting: Emergency Medicine

## 2012-12-03 ENCOUNTER — Inpatient Hospital Stay (HOSPITAL_COMMUNITY)
Admission: EM | Admit: 2012-12-03 | Discharge: 2012-12-18 | DRG: 255 | Disposition: A | Discharge status: Home Health | Payer: No Typology Code available for payment source | Attending: Internal Medicine | Admitting: Internal Medicine

## 2012-12-03 DIAGNOSIS — R0602 Shortness of breath: Secondary | ICD-10-CM

## 2012-12-03 DIAGNOSIS — E119 Type 2 diabetes mellitus without complications: Secondary | ICD-10-CM

## 2012-12-03 DIAGNOSIS — J9600 Acute respiratory failure, unspecified whether with hypoxia or hypercapnia: Secondary | ICD-10-CM | POA: Diagnosis present

## 2012-12-03 DIAGNOSIS — E871 Hypo-osmolality and hyponatremia: Secondary | ICD-10-CM | POA: Diagnosis not present

## 2012-12-03 DIAGNOSIS — I998 Other disorder of circulatory system: Secondary | ICD-10-CM

## 2012-12-03 DIAGNOSIS — I5021 Acute systolic (congestive) heart failure: Secondary | ICD-10-CM

## 2012-12-03 DIAGNOSIS — M109 Gout, unspecified: Secondary | ICD-10-CM | POA: Diagnosis present

## 2012-12-03 DIAGNOSIS — I129 Hypertensive chronic kidney disease with stage 1 through stage 4 chronic kidney disease, or unspecified chronic kidney disease: Secondary | ICD-10-CM | POA: Diagnosis present

## 2012-12-03 DIAGNOSIS — N049 Nephrotic syndrome with unspecified morphologic changes: Secondary | ICD-10-CM | POA: Diagnosis present

## 2012-12-03 DIAGNOSIS — I50810 Right heart failure, unspecified: Secondary | ICD-10-CM

## 2012-12-03 DIAGNOSIS — N179 Acute kidney failure, unspecified: Secondary | ICD-10-CM

## 2012-12-03 DIAGNOSIS — Z951 Presence of aortocoronary bypass graft: Secondary | ICD-10-CM

## 2012-12-03 DIAGNOSIS — E1151 Type 2 diabetes mellitus with diabetic peripheral angiopathy without gangrene: Secondary | ICD-10-CM | POA: Diagnosis present

## 2012-12-03 DIAGNOSIS — E1129 Type 2 diabetes mellitus with other diabetic kidney complication: Secondary | ICD-10-CM | POA: Diagnosis present

## 2012-12-03 DIAGNOSIS — F411 Generalized anxiety disorder: Secondary | ICD-10-CM | POA: Diagnosis present

## 2012-12-03 DIAGNOSIS — Z6841 Body Mass Index (BMI) 40.0 and over, adult: Secondary | ICD-10-CM

## 2012-12-03 DIAGNOSIS — Z8711 Personal history of peptic ulcer disease: Secondary | ICD-10-CM

## 2012-12-03 DIAGNOSIS — R600 Localized edema: Secondary | ICD-10-CM

## 2012-12-03 DIAGNOSIS — I2510 Atherosclerotic heart disease of native coronary artery without angina pectoris: Secondary | ICD-10-CM

## 2012-12-03 DIAGNOSIS — D509 Iron deficiency anemia, unspecified: Secondary | ICD-10-CM | POA: Diagnosis present

## 2012-12-03 DIAGNOSIS — E876 Hypokalemia: Secondary | ICD-10-CM | POA: Diagnosis present

## 2012-12-03 DIAGNOSIS — I96 Gangrene, not elsewhere classified: Secondary | ICD-10-CM | POA: Diagnosis present

## 2012-12-03 DIAGNOSIS — D62 Acute posthemorrhagic anemia: Secondary | ICD-10-CM

## 2012-12-03 DIAGNOSIS — I509 Heart failure, unspecified: Secondary | ICD-10-CM

## 2012-12-03 DIAGNOSIS — M79609 Pain in unspecified limb: Secondary | ICD-10-CM

## 2012-12-03 DIAGNOSIS — E1169 Type 2 diabetes mellitus with other specified complication: Secondary | ICD-10-CM | POA: Diagnosis present

## 2012-12-03 DIAGNOSIS — I75029 Atheroembolism of unspecified lower extremity: Secondary | ICD-10-CM | POA: Diagnosis present

## 2012-12-03 DIAGNOSIS — I514 Myocarditis, unspecified: Secondary | ICD-10-CM | POA: Diagnosis present

## 2012-12-03 DIAGNOSIS — N189 Chronic kidney disease, unspecified: Secondary | ICD-10-CM

## 2012-12-03 DIAGNOSIS — E039 Hypothyroidism, unspecified: Secondary | ICD-10-CM | POA: Diagnosis present

## 2012-12-03 DIAGNOSIS — I739 Peripheral vascular disease, unspecified: Secondary | ICD-10-CM | POA: Diagnosis present

## 2012-12-03 DIAGNOSIS — J9601 Acute respiratory failure with hypoxia: Secondary | ICD-10-CM

## 2012-12-03 DIAGNOSIS — Z23 Encounter for immunization: Secondary | ICD-10-CM

## 2012-12-03 DIAGNOSIS — I5043 Acute on chronic combined systolic (congestive) and diastolic (congestive) heart failure: Principal | ICD-10-CM | POA: Diagnosis present

## 2012-12-03 DIAGNOSIS — I160 Hypertensive urgency: Secondary | ICD-10-CM

## 2012-12-03 HISTORY — DX: Hyperlipidemia, unspecified: E78.5

## 2012-12-03 HISTORY — DX: Acute myocardial infarction, unspecified: I21.9

## 2012-12-03 HISTORY — DX: Cardiomyopathy, unspecified: I42.9

## 2012-12-03 HISTORY — DX: Calculus of kidney: N20.0

## 2012-12-03 HISTORY — DX: Iron deficiency anemia, unspecified: D50.9

## 2012-12-03 HISTORY — DX: Gastrointestinal hemorrhage, unspecified: K92.2

## 2012-12-03 HISTORY — DX: Anxiety disorder, unspecified: F41.9

## 2012-12-03 HISTORY — DX: Type 2 diabetes mellitus without complications: E11.9

## 2012-12-03 LAB — PRO B NATRIURETIC PEPTIDE: Pro B Natriuretic peptide (BNP): 8983 pg/mL — ABNORMAL HIGH (ref 0–125)

## 2012-12-03 LAB — URINALYSIS, ROUTINE W REFLEX MICROSCOPIC
Ketones, ur: NEGATIVE mg/dL
Leukocytes, UA: NEGATIVE
Nitrite: NEGATIVE
Protein, ur: >300 mg/dL — AB
Urobilinogen, UA: 0.2 mg/dL (ref 0.0–1.0)
pH: 5 (ref 5.0–8.0)

## 2012-12-03 LAB — CBC
HCT: 37.6 % — ABNORMAL LOW (ref 39.0–52.0)
HCT: 40.3 % (ref 39.0–52.0)
Hemoglobin: 11 g/dL — ABNORMAL LOW (ref 13.0–17.0)
Hemoglobin: 12.2 g/dL — ABNORMAL LOW (ref 13.0–17.0)
MCHC: 30.3 g/dL (ref 30.0–36.0)
RBC: 5.37 MIL/uL (ref 4.22–5.81)
RDW: 17.5 % — ABNORMAL HIGH (ref 11.5–15.5)
WBC: 8.8 10*3/uL (ref 4.0–10.5)
WBC: 12.5 10*3/uL — ABNORMAL HIGH (ref 4.0–10.5)

## 2012-12-03 LAB — BASIC METABOLIC PANEL
BUN: 21 mg/dL (ref 6–23)
Chloride: 102 mEq/L (ref 96–112)
GFR calc Af Amer: 79 mL/min — ABNORMAL LOW (ref >90)
GFR calc non Af Amer: 69 mL/min — ABNORMAL LOW (ref >90)
Potassium: 3.7 mEq/L (ref 3.5–5.1)
Sodium: 139 mEq/L (ref 135–145)

## 2012-12-03 LAB — CREATININE, SERUM
Creatinine, Ser: 1.44 mg/dL — ABNORMAL HIGH (ref 0.50–1.35)
GFR calc Af Amer: 65 mL/min — ABNORMAL LOW (ref >90)
GFR calc non Af Amer: 56 mL/min — ABNORMAL LOW (ref >90)

## 2012-12-03 LAB — POCT I-STAT TROPONIN I: Troponin i, poc: 0.03 ng/mL (ref 0.00–0.08)

## 2012-12-03 LAB — URINE MICROSCOPIC-ADD ON

## 2012-12-03 LAB — GLUCOSE, CAPILLARY: Glucose-Capillary: 160 mg/dL — ABNORMAL HIGH (ref 70–99)

## 2012-12-03 MED ORDER — LISINOPRIL 20 MG PO TABS
20.0000 mg | ORAL_TABLET | Freq: Every day | ORAL | Status: DC
  Administered 2012-12-03: 20 mg via ORAL
  Filled 2012-12-03 (×2): qty 1

## 2012-12-03 MED ORDER — NITROGLYCERIN IN D5W 200-5 MCG/ML-% IV SOLN: 15.0000 ug/min | INTRAVENOUS | Status: DC

## 2012-12-03 MED ORDER — MORPHINE SULFATE 2 MG/ML IJ SOLN
2.0000 mg | INTRAMUSCULAR | Status: DC | PRN
  Administered 2012-12-03 – 2012-12-04 (×3): 2 mg via INTRAVENOUS
  Filled 2012-12-03 (×3): qty 1

## 2012-12-03 MED ORDER — SODIUM CHLORIDE 0.9 % IV SOLN
250.0000 mL | INTRAVENOUS | Status: DC | PRN
  Administered 2012-12-08 – 2012-12-16 (×2): 250 mL via INTRAVENOUS

## 2012-12-03 MED ORDER — OXYCODONE-ACETAMINOPHEN 5-325 MG PO TABS
1.0000 | ORAL_TABLET | ORAL | Status: DC | PRN
  Administered 2012-12-03 – 2012-12-04 (×3): 2 via ORAL
  Filled 2012-12-03 (×3): qty 2

## 2012-12-03 MED ORDER — LABETALOL HCL 5 MG/ML IV SOLN
20.0000 mg | Freq: Once | INTRAVENOUS | Status: AC
  Administered 2012-12-03: 20 mg via INTRAVENOUS
  Filled 2012-12-03: qty 4

## 2012-12-03 MED ORDER — ACETAMINOPHEN 325 MG PO TABS
650.0000 mg | ORAL_TABLET | ORAL | Status: DC | PRN
  Administered 2012-12-17: 650 mg via ORAL
  Filled 2012-12-03: qty 2

## 2012-12-03 MED ORDER — FERROUS GLUCONATE 324 (38 FE) MG PO TABS
324.0000 mg | ORAL_TABLET | Freq: Three times a day (TID) | ORAL | Status: DC
  Administered 2012-12-04 – 2012-12-18 (×39): 324 mg via ORAL
  Filled 2012-12-03 (×47): qty 1

## 2012-12-03 MED ORDER — ATORVASTATIN CALCIUM 10 MG PO TABS
10.0000 mg | ORAL_TABLET | Freq: Every day | ORAL | Status: DC
  Administered 2012-12-04 – 2012-12-17 (×14): 10 mg via ORAL
  Filled 2012-12-03 (×15): qty 1

## 2012-12-03 MED ORDER — SODIUM CHLORIDE 0.9 % IJ SOLN: 3.0000 mL | INTRAMUSCULAR | Status: DC | PRN

## 2012-12-03 MED ORDER — SODIUM CHLORIDE 0.9 % IV SOLN
INTRAVENOUS | Status: DC
  Administered 2012-12-03: 14:00:00 via INTRAVENOUS

## 2012-12-03 MED ORDER — SODIUM CHLORIDE 0.9 % IJ SOLN
3.0000 mL | Freq: Two times a day (BID) | INTRAMUSCULAR | Status: DC
  Administered 2012-12-03 – 2012-12-07 (×7): 3 mL via INTRAVENOUS
  Administered 2012-12-08: 21:00:00 via INTRAVENOUS
  Administered 2012-12-08 – 2012-12-18 (×10): 3 mL via INTRAVENOUS

## 2012-12-03 MED ORDER — ONDANSETRON HCL 4 MG/2ML IJ SOLN
4.0000 mg | Freq: Once | INTRAMUSCULAR | Status: AC
  Administered 2012-12-03: 4 mg via INTRAVENOUS
  Filled 2012-12-03: qty 2

## 2012-12-03 MED ORDER — POTASSIUM CHLORIDE CRYS ER 20 MEQ PO TBCR
20.0000 meq | EXTENDED_RELEASE_TABLET | Freq: Two times a day (BID) | ORAL | Status: DC
  Administered 2012-12-03: 20 meq via ORAL
  Filled 2012-12-03 (×3): qty 1

## 2012-12-03 MED ORDER — HYDROMORPHONE HCL PF 1 MG/ML IJ SOLN
1.0000 mg | Freq: Once | INTRAMUSCULAR | Status: AC
  Administered 2012-12-03: 1 mg via INTRAVENOUS
  Filled 2012-12-03: qty 1

## 2012-12-03 MED ORDER — INSULIN ASPART 100 UNIT/ML ~~LOC~~ SOLN
0.0000 [IU] | Freq: Three times a day (TID) | SUBCUTANEOUS | Status: DC
  Administered 2012-12-04: 1 [IU] via SUBCUTANEOUS
  Administered 2012-12-04: 2 [IU] via SUBCUTANEOUS
  Administered 2012-12-04: 1 [IU] via SUBCUTANEOUS
  Administered 2012-12-05: 2 [IU] via SUBCUTANEOUS
  Administered 2012-12-05 – 2012-12-06 (×4): 1 [IU] via SUBCUTANEOUS
  Administered 2012-12-06 – 2012-12-07 (×3): 2 [IU] via SUBCUTANEOUS
  Administered 2012-12-07 – 2012-12-09 (×5): 1 [IU] via SUBCUTANEOUS

## 2012-12-03 MED ORDER — ENOXAPARIN SODIUM 40 MG/0.4ML ~~LOC~~ SOLN
40.0000 mg | SUBCUTANEOUS | Status: DC
  Administered 2012-12-03: 40 mg via SUBCUTANEOUS
  Filled 2012-12-03 (×2): qty 0.4

## 2012-12-03 MED ORDER — INFLUENZA VIRUS VACC SPLIT PF IM SUSP
0.5000 mL | INTRAMUSCULAR | Status: AC
  Administered 2012-12-04: 0.5 mL via INTRAMUSCULAR
  Filled 2012-12-03: qty 0.5

## 2012-12-03 MED ORDER — ONDANSETRON HCL 4 MG/2ML IJ SOLN
4.0000 mg | Freq: Four times a day (QID) | INTRAMUSCULAR | Status: DC | PRN
  Administered 2012-12-03: 4 mg via INTRAVENOUS
  Filled 2012-12-03 (×2): qty 2

## 2012-12-03 MED ORDER — CARVEDILOL 3.125 MG PO TABS
3.1250 mg | ORAL_TABLET | Freq: Two times a day (BID) | ORAL | Status: DC
  Administered 2012-12-04 – 2012-12-18 (×27): 3.125 mg via ORAL
  Filled 2012-12-03 (×31): qty 1

## 2012-12-03 MED ORDER — FUROSEMIDE 10 MG/ML IJ SOLN
40.0000 mg | Freq: Two times a day (BID) | INTRAMUSCULAR | Status: DC
  Administered 2012-12-03: 40 mg via INTRAVENOUS
  Filled 2012-12-03 (×4): qty 4

## 2012-12-03 MED ORDER — FUROSEMIDE 10 MG/ML IJ SOLN
20.0000 mg | Freq: Once | INTRAMUSCULAR | Status: AC
  Administered 2012-12-03: 20 mg via INTRAVENOUS
  Filled 2012-12-03: qty 2

## 2012-12-03 NOTE — ED Notes (Signed)
Pt reports SOB increasing over the last 3 weeks. Also noted to have 50lb weight gain. Seen by PMD in office today and sent here for suspected CHF flare. Pt also c/o gout pain to bilateral feet.

## 2012-12-03 NOTE — ED Provider Notes (Signed)
History     CSN: [id number]  Arrival date & time 12/03/12  1157   First MD Initiated Contact with Patient 12/03/12 1340      Chief Complaint  Patient presents with  . Shortness of Breath  . Foot Pain    (Consider location/radiation/quality/duration/timing/severity/associated sxs/prior treatment) HPI Comments: Jose Petersen is a 47 y.o. Male who is here for leg swelling, with painful feet for several weeks. He also is having more difficulty walking, because it takes much exertion, and is concerned about a 50 pound weight gain. He has tried treatments for gout including colchicine and prednisone, without relief. His feet hurt, as if he had a gout attack. She has a history of coronary artery disease, and congestive heart failure. He does not currently have a cardiologist. The foot, and leg discomfort, is worse with ambulation. He denies fever, or chills, chest pain, shortness of breath at rest, orthopnea, nausea, or vomiting. There are no other modifying factors.  Patient is a 47 y.o. male presenting with shortness of breath and lower extremity pain. The history is provided by the patient.  Shortness of Breath  Associated symptoms include shortness of breath.  Foot Pain Associated symptoms include shortness of breath.    Past Medical History  Diagnosis Date  . Coronary artery disease     a. 4V CABG 2007 in [place], possible mild MI. b. Repeat cath ~2009-2010 pre-op knee surgery that was reportedly "clean" per pt.  . Hypertension   . Gout   . Diverticulitis   . Hyperlipidemia   . GI bleed     a. Anemia/GIB with bleeding duodenal ulcer 08/2012.  Marland Kitchen Myocardial infarction 2007    "small one before OHS" (12/03/2012)  . Type II diabetes mellitus 2013  . Anxiety     "over the last couple weeks" (12/03/2012)  . Kidney stones ~ 1989; 10/2012  . Iron deficiency anemia     a. GIB 08/2012.  . Cardiomyopathy     a. EF 45-50% 08/2012.    Past Surgical History  Procedure Date  . Coronary artery  bypass graft 2007    CABG X 4  . Knee cartilage surgery 2009    "left" (12/03/2012)  . Esophagogastroduodenoscopy 08/28/2012    Procedure: ESOPHAGOGASTRODUODENOSCOPY (EGD);  Surgeon: Willis Modena, MD;  Location: [facility];  Service: Endoscopy;  Laterality: N/A;  . Tonsillectomy 1960's  . Knee arthroscopy 1990's-2000's    "3 on the left; all arthoscopies for meninscus tear" (12/03/2012)  . Cardiac catheterization ~ 2009  . Cystoscopy/retrograde/ureteroscopy/stone extraction with basket ?1989  . Cystoscopy with retrograde pyelogram, ureteroscopy and stent placement 10/2012    Family History  Problem Relation Age of Onset  . COPD Father   . Heart disease Father   . High blood pressure Sister     History  Substance Use Topics  . Smoking status: Former Smoker -- 1.0 packs/day for 24 years    Types: Cigarettes    Quit date: 12/03/2003  . Smokeless tobacco: Never Used  . Alcohol Use: Yes     Comment: 1/2/2014occasional social, 1 drink per year "if that"      Review of Systems  Respiratory: Positive for shortness of breath.   All other systems reviewed and are negative.    Allergies  Toradol and Motrin  Home Medications   No current outpatient prescriptions on file.  BP 152/104  Pulse 75  Temp 97.7 F (36.5 C) (Oral)  Resp 20  Ht 5\' 9"  (1.753 m)  Wt 296  lb 12.8 oz (134.628 kg)  BMI 43.83 kg/m2  SpO2 98%  Physical Exam  Nursing note and vitals reviewed. Constitutional: He is oriented to person, place, and time. He appears well-developed.       Overweight  HENT:  Head: Normocephalic and atraumatic.  Right Ear: External ear normal.  Left Ear: External ear normal.  Eyes: Conjunctivae normal and EOM are normal. Pupils are equal, round, and reactive to light.  Neck: Normal range of motion and phonation normal. Neck supple.  Cardiovascular: Normal rate, regular rhythm, normal heart sounds and intact distal pulses.        Hypertensive  Pulmonary/Chest: Effort  normal and breath sounds normal. No respiratory distress. He has no wheezes. He has no rales. He exhibits no bony tenderness.  Abdominal: Soft. Normal appearance. There is no tenderness.  Musculoskeletal:       Lower leg swelling, bilaterally. Calves are nontender to palpation. Both feet swollen, 3+. Left foot, diffusely tender to palpation. Right foot, tender to palpation, but less than the left foot. Right third and fourth toes are exquisitely tender to palpation, somewhat swollen, and have a purple discoloration.  Neurological: He is alert and oriented to person, place, and time. He has normal strength. No cranial nerve deficit or sensory deficit. He exhibits normal muscle tone. Coordination normal.  Skin: Skin is warm, dry and intact.  Psychiatric: He has a normal mood and affect. His behavior is normal. Judgment and thought content normal.    ED Course  Procedures (including critical care time)  Toe ischemia evaluated with ABI-see report  Emergency department treatment: IV, Dilaudid, x2, injections; Zofran, x2.  Reevaluation: 17:00- his pain is better. His blood pressure continues to be high at 182/117. Patient did not take his lisinopril today. He, states that his PCP prescribed Lasix 20 mg 3 days ago. He has taken 2 doses, none today.  Labetalol and Lasix ordered- 17:02- BP improved to 138/91,  with single dose.  CRITICAL CARE Performed by: Flint Melter   Total critical care time: 35 minutes Critical care time was exclusive of separately billable procedures and treating other patients.  Critical care was necessary to treat or prevent imminent or life-threatening deterioration.  Critical care was time spent personally by me on the following activities: development of treatment plan with patient and/or surrogate as well as nursing, discussions with consultants, evaluation of patient's response to treatment, examination of patient, obtaining history from patient or surrogate,  ordering and performing treatments and interventions, ordering and review of laboratory studies, ordering and review of radiographic studies, pulse oximetry and re-evaluation of patient's condition.   Labs Reviewed  CBC - Abnormal; Notable for the following:    Hemoglobin 11.0 (*)     HCT 37.6 (*)     MCV 74.2 (*)     MCH 21.7 (*)     MCHC 29.3 (*)     RDW 17.5 (*)     Platelets 403 (*)     All other components within normal limits  BASIC METABOLIC PANEL - Abnormal; Notable for the following:    Glucose, Bld 162 (*)     GFR calc non Af Amer 69 (*)     GFR calc Af Amer 79 (*)     All other components within normal limits  PRO B NATRIURETIC PEPTIDE - Abnormal; Notable for the following:    Pro B Natriuretic peptide (BNP) 8983.0 (*)     All other components within normal limits  URINALYSIS, ROUTINE W REFLEX  MICROSCOPIC - Abnormal; Notable for the following:    Color, Urine AMBER (*)  BIOCHEMICALS MAY BE AFFECTED BY COLOR   Specific Gravity, Urine 1.039 (*)     Bilirubin Urine SMALL (*)     Protein, ur >300 (*)     All other components within normal limits  BASIC METABOLIC PANEL - Abnormal; Notable for the following:    Glucose, Bld 140 (*)     BUN 25 (*)     Creatinine, Ser 1.53 (*)     GFR calc non Af Amer 52 (*)     GFR calc Af Amer 60 (*)     All other components within normal limits  TSH - Abnormal; Notable for the following:    TSH 13.973 (*)     All other components within normal limits  CBC - Abnormal; Notable for the following:    WBC 12.5 (*)     Hemoglobin 12.2 (*)     MCV 75.0 (*)     MCH 22.7 (*)     RDW 17.5 (*)     Platelets 440 (*)     All other components within normal limits  CREATININE, SERUM - Abnormal; Notable for the following:    Creatinine, Ser 1.44 (*)     GFR calc non Af Amer 56 (*)     GFR calc Af Amer 65 (*)     All other components within normal limits  URINE MICROSCOPIC-ADD ON - Abnormal; Notable for the following:    Bacteria, UA FEW (*)      Casts HYALINE CASTS (*)     All other components within normal limits  GLUCOSE, CAPILLARY - Abnormal; Notable for the following:    Glucose-Capillary 160 (*)     All other components within normal limits  GLUCOSE, CAPILLARY - Abnormal; Notable for the following:    Glucose-Capillary 126 (*)     All other components within normal limits  HEPATIC FUNCTION PANEL - Abnormal; Notable for the following:    Albumin 3.4 (*)     Bilirubin, Direct 0.4 (*)     All other components within normal limits  GLUCOSE, CAPILLARY - Abnormal; Notable for the following:    Glucose-Capillary 157 (*)     All other components within normal limits  POCT I-STAT TROPONIN I  SEDIMENTATION RATE  TROPONIN I  TROPONIN I  TROPONIN I  URIC ACID  TSH  T4, FREE  T3, FREE  PROTEIN ELECTROPHORESIS, SERUM  IMMUNOFIXATION ELECTROPHORESIS, URINE (WITH TOT PROT)  PROTEIN / CREATININE RATIO, URINE  PROTEIN, URINE, 24 HOUR  CREATININE, URINE, 24 HOUR  VITAMIN B12  FOLATE  IRON AND TIBC  FERRITIN  RETICULOCYTES  D-DIMER, QUANTITATIVE   Dg Chest 2 View  12/03/2012  *RADIOLOGY REPORT*  Clinical Data: 47 year old male with swelling, weight gain, weakness.  CHEST - 2 VIEW  Comparison: 08/29/2012 and earlier.  Findings: Sequelae of CABG.  Stable median sternotomy wires.  Lower lung volumes.  Patchy opacity at the lung bases, greater on the left.  There is a small left pleural effusion.  No overt pulmonary edema.  No pneumothorax. Stable cardiomegaly and mediastinal contours.  Visualized tracheal air column is within normal limits. No acute osseous abnormality identified.  IMPRESSION: New small left pleural effusion.  Patchy left basilar opacity, favor atelectasis but developing pneumonia is difficult to exclude. No acute pulmonary edema.   Original Report Authenticated By: Erskine Speed, M.D.    Dg Foot Complete Left  12/03/2012  **ADDENDUM**  CREATED: 12/03/2012 14:52:22  Incidental Achilles spur.  **END ADDENDUM** SIGNED BY:  Elsie Stain, M.D.   12/03/2012  *RADIOLOGY REPORT*  Clinical Data: Swelling and pain in both feet.  History of gout.  LEFT FOOT - COMPLETE 3+ VIEW  Comparison: None.  Findings: There is no evidence of fracture or dislocation.  There is no evidence of arthropathy or other focal bony abnormality. There is diffuse soft tissue swelling without osseous erosion.  IMPRESSION: Soft tissue swelling.  No focal bony abnormality.   Original Report Authenticated By: Davonna Belling, M.D.    Dg Foot Complete Right  12/03/2012  *RADIOLOGY REPORT*  Clinical Data: Swelling and pain.  History of gout.  RIGHT FOOT COMPLETE - 3+ VIEW  Comparison: None.  Findings: Mild soft tissue swelling proximally and about the ankle. There is no evidence of fracture or dislocation.  There is no evidence of arthropathy or other focal bony abnormality.  No erosive lesion is identified. Plantar are and Achilles spurs are noted.  IMPRESSION: Mild soft tissue swelling.  Focal bony abnormality.   Original Report Authenticated By: Davonna Belling, M.D.      1. CHF (congestive heart failure)   2. Acute respiratory failure with hypoxia   3. CHF, acute on chronic   4. Coronary artery disease   5. DM (diabetes mellitus)   6. Acute blood loss anemia   7. Ischemia of toe   8. Hypertensive urgency       MDM  Decompensated Heart Failure with anemia, and probable peripheral vascular disease. Pt needs admission for treatment and further diagnostic evaluation        Flint Melter, MD 12/04/12 1300

## 2012-12-03 NOTE — H&P (Signed)
[facility] History and Physical  [name] NWG:[id number] DOB: 1966-01-31 DOA: 12/03/2012  Referring physician: Gray Bernhardt, MD PCP: [facility]  Chief Complaint: Shortness of breath  HPI: The patient is a 47 year-old male with history of CAD s/p CABG, CHF with EF 50%, DM2, gout, and bleeding ulcer, who presents with foot pain and shortness of breath.  He was well until a month ago when he developed pain in the left foot first 3 toes and the plantar and dorsal aspects of the same foot.  He has developed pain of the 2nd-4th toes of the right foot with some discoloration.  He was seen by his primary care doctor who attributed his symptoms to gout, which is a chronic problem for him.  He was treated with a medrol dose pack which did not help.  Then he started colchicine which also did not help.  He has had worsening pain in the same areas over the last two weeks with increased swelling of the ankles and legs. He has not noticed redness or warmth similar to his previous gouty attacks and the darkening of his toes has been stable.  He received dilaudid in the ER for pain control.  XRs of the feet demonstrated only soft tissue swelling.  ABIs of the legs were unremarkable but the right great toe had an abnormal wave form.    He started having swelling of the legs about 3-4 weeks ago and he has gained about 45-50-lbs in the last 2-3 weeks.  Today, he weighed 300-lbs and his normal weight is about 250 to 255-lbs.  He has been feeling progressively more Jose Petersen of breath and fatigued.  At baseline, he is able to ambulate 1 block before needing to rest.  Today he had difficulty even showering.   + orthopnea, + PND.  He is unsure if the PND is worse than baseline.  He was seen by his primary care doctor who referred him to the emergency department for acute CHF exacerbation.  Denies chest pain or tightness (including with exertion), palpitations.  He has been eating "as well as I ever have" with  low fat, low portion meals, low salt.  Denies TV dinners, canned foods.   About three weeks ago, he did have some upper respiratory symptoms with rhinorrhea and sinus congestion.  He has had persistent cough since that time.   Works in [place] and was trying to follow up with Dr. Jens Petersen at his [place] location.  His appointment is scheduled within the next 7 to 10 days.   He reports "clean" left heart cath prior to knee surgery in [place], [place] in 2009.   Review of Systems:  Denies fevers, chills, changes to hearing or vision, rhinorrhea, sinus congestion, sore throat.  He has had cough with wheezing and shortness of breath as per HPI.  Initially denied nausea and vomiting, however vomited during exam.  Denies diarrhea, constipation, blood in stool, dysuria, urinary frequency, lymphadenopathy.  He has had skin discoloration of his toes, as per HPI.  Denies focal numbness, weakness, slurred speech, or confusion.  He feels anxious about his toes.   Past Medical History  Diagnosis Date  . Coronary artery disease     CABG 4 vessel 2007 in [place], had mild symptoms  . Hypertension   . Gout   . Diverticulitis   . Diabetes mellitus 2013  . Kidney calculus     multiple kidney stones  . CHF (congestive heart failure) dx 08/2012  .  Hyperlipidemia   . Hx of tobacco use, presenting hazards to Petersen   . Duodenal ulcer 08/2012    bleeding ulcer   Past Surgical History  Procedure Date  . Coronary artery bypass graft 2007  . Knee surgery 2009    Has had 4 left knee surgeries, all arthoscopies for meninscus tears  . Esophagogastroduodenoscopy 08/28/2012    Procedure: ESOPHAGOGASTRODUODENOSCOPY (EGD);  Surgeon: Willis Modena, MD;  Location: [facility];  Service: Endoscopy;  Laterality: N/A;   Social History:  reports that he quit smoking about 9 years ago. His smoking use included Cigarettes. He has a 24 pack-year smoking history. He has never used smokeless tobacco. He reports that he  drinks alcohol. He reports that he does not use illicit drugs.  Lives in a house without stairs.  Lives with his mom to be with her when his father got sick to help with the house.  He does not use a cane or walker and drives.  He previously worked out a [employer] about 6 days per week until a month ago.   Has insurance.  His primary care office is [facility].  Dr. Jens Petersen is Cardiologist.     Allergies  Allergen Reactions  . Motrin (Ibuprofen)     Pt has ulcer  . Toradol (Ketorolac Tromethamine)     Unknown    Family History  Problem Relation Age of Onset  . COPD Father   . Heart disease Father   . High blood pressure Sister     Prior to Admission medications   Medication Sig Start Date End Date Taking? Authorizing Provider  carvedilol (COREG) 6.25 MG tablet Take 1 tablet (6.25 mg total) by mouth 2 (two) times daily with a meal. 08/31/12  Yes Vassie Loll, MD  colchicine 0.6 MG tablet Take 1 tablet (0.6 mg total) by mouth 2 (two) times daily. 07/18/12 07/18/13 Yes Hannah Muthersbaugh, PA-C  ferrous gluconate (FERGON) 216 MG tablet Take 1 tablet (216 mg total) by mouth 3 (three) times daily with meals. 08/31/12  Yes Vassie Loll, MD  glucose blood test strip Use as instructed 08/31/12  Yes Vassie Loll, MD  glucose monitoring kit (FREESTYLE) monitoring kit 1 each by Does not apply route as needed for other. 08/31/12  Yes Vassie Loll, MD  Lancets (FREESTYLE) lancets Use as instructed 08/31/12  Yes Vassie Loll, MD  lisinopril (PRINIVIL,ZESTRIL) 20 MG tablet Take 20 mg by mouth daily.   Yes Historical Provider, MD  metFORMIN (GLUCOPHAGE) 500 MG tablet Take 1 tablet (500 mg total) by mouth 2 (two) times daily with a meal. 08/31/12  Yes Vassie Loll, MD  rosuvastatin (CRESTOR) 10 MG tablet Take 10 mg by mouth daily.   Yes Historical Provider, MD   Physical Exam: Filed Vitals:   12/03/12 1815 12/03/12 1845 12/03/12 1900 12/03/12 1930  BP: 133/97 144/116 146/92 135/89  Pulse:  62 61 63 64  Temp:      TempSrc:      Resp: 16 17  17   SpO2: 98% 99% 96% 98%     General:  Obese CM, no acute distress, initially pleasant and conversant, then vomiting  Eyes: PERRL, anicteric, non-injected  ENT: Nares clear.  OP non-erythematous. No exudate or plaques.  Neck: Supple, no TM.  Lymph: No cervical, supraclavicular LAD.  Cardiovascular: Distant heart sounds.  RRR, normal S1, S2.  No murmurs.  Respiratory: No increased WOB.  Diminished breath sounds bilaterally with full expiratory wheezes.  No rales or rhonchi.  Abdomen: NABS.  Soft, mildly distended, obese, and non-tender.  Skin: Lateral aspect of 2nd toe of right foot is discolored, as are the tips of the 3rd and 4th toes distal to the DIP joints on the right.  Mottling of the first toe on the right present.  Musculoskeletal: 2+ pitting edema of both lower extremities.  No cyanosis or clubbing.  Full ROM.  Psychiatric: A&O x 4.  Normal affect.  Neurologic: CN 2-12 grossly intact.  Strength 5/5 throughout.  Hyperesthetic on right toes.  Labs on Admission:  Basic Metabolic Panel:  Lab 12/03/12 1610  NA 139  K 3.7  CL 102  CO2 20  GLUCOSE 162*  BUN 21  CREATININE 1.22  CALCIUM 9.2  MG --  PHOS --   Liver Function Tests: No results found for this basename: AST:5,ALT:5,ALKPHOS:5,BILITOT:5,PROT:5,ALBUMIN:5 in the last 168 hours No results found for this basename: LIPASE:5,AMYLASE:5 in the last 168 hours No results found for this basename: AMMONIA:5 in the last 168 hours CBC:  Lab 12/03/12 1253  WBC 8.8  NEUTROABS --  HGB 11.0*  HCT 37.6*  MCV 74.2*  PLT 403*   Cardiac Enzymes: No results found for this basename: CKTOTAL:5,CKMB:5,CKMBINDEX:5,TROPONINI:5 in the last 168 hours  BNP (last 3 results)  Basename 12/03/12 1253 08/28/12 0420  PROBNP 8983.0* 5097.0*   CBG: No results found for this basename: GLUCAP:5 in the last 168 hours  Radiological Exams on Admission: Dg Chest 2  View  12/03/2012  *RADIOLOGY REPORT*  Clinical Data: 47 year old male with swelling, weight gain, weakness.  CHEST - 2 VIEW  Comparison: 08/29/2012 and earlier.  Findings: Sequelae of CABG.  Stable median sternotomy wires.  Lower lung volumes.  Patchy opacity at the lung bases, greater on the left.  There is a small left pleural effusion.  No overt pulmonary edema.  No pneumothorax. Stable cardiomegaly and mediastinal contours.  Visualized tracheal air column is within normal limits. No acute osseous abnormality identified.  IMPRESSION: New small left pleural effusion.  Patchy left basilar opacity, favor atelectasis but developing pneumonia is difficult to exclude. No acute pulmonary edema.   Original Report Authenticated By: Erskine Speed, M.D.    Dg Foot Complete Left  12/03/2012  **ADDENDUM** CREATED: 12/03/2012 14:52:22  Incidental Achilles spur.  **END ADDENDUM** SIGNED BY: Elsie Stain, M.D.   12/03/2012  *RADIOLOGY REPORT*  Clinical Data: Swelling and pain in both feet.  History of gout.  LEFT FOOT - COMPLETE 3+ VIEW  Comparison: None.  Findings: There is no evidence of fracture or dislocation.  There is no evidence of arthropathy or other focal bony abnormality. There is diffuse soft tissue swelling without osseous erosion.  IMPRESSION: Soft tissue swelling.  No focal bony abnormality.   Original Report Authenticated By: Davonna Belling, M.D.    Dg Foot Complete Right  12/03/2012  *RADIOLOGY REPORT*  Clinical Data: Swelling and pain.  History of gout.  RIGHT FOOT COMPLETE - 3+ VIEW  Comparison: None.  Findings: Mild soft tissue swelling proximally and about the ankle. There is no evidence of fracture or dislocation.  There is no evidence of arthropathy or other focal bony abnormality.  No erosive lesion is identified. Plantar are and Achilles spurs are noted.  IMPRESSION: Mild soft tissue swelling.  Focal bony abnormality.   Original Report Authenticated By: Davonna Belling, M.D.     EKG: Independently  reviewed. NSR with non-specific T-wave changes.  Assessment/Plan Principal Problem:  *Shortness of breath Active Problems:  DM (diabetes mellitus)  Edema of extremities  CHF, acute on chronic  Acute respiratory failure with hypoxia  Ischemia of toe  Coronary artery disease  Acute on chronic CHF:  Patient has elevated BNP and 50 pound weight gain over a Jose Petersen period of time.  Exacerbation may have been triggered by Medrol pack, URI, or both.  Will r/o ischemia and thyroid problem. - daily wt, strick I&O's - 2 gram Na limit - Lasix 40 mg IV BID, goal negative 3-5 liters/day - KCl 20 meq PO BID - Continue ACEI - Will cut beta-blocker in half, given decompensation - Please call [place] cardiology in AM - Cycle cardiac biomarkers and check TSH - Echo - Wean supplemental oxygen as tolerated  Toe ischemia (not acute):  Question vascular insufficiency versus emboli.  ABI's previously performed. - Vascular consult - Echo to eval for intracardiac thrombus  DM2:  Well-controlled. - SSI - Hold metformin  CAD s/p CABG:  Pt is on ACEI and beta-blocker.  He has not restarted ASA since his bleeding ulcer in Sep. - Continue to hold ASA pending cardiology evaluation - Cont ACEI and half-dose beta-blocker  Iron deficiency anemia:  Hgb improved with iron therapy. - Continue iron  Diet: Diabetic Access: PIV IVF: None Proph: Lovenox  Code Status: Full code Family Communication: Spoke with pt alone. Disposition Plan: Pending aggressive diuresis  Time spent: 60 minutes  Renae Fickle [facility] Pager [phone]  If 7PM-7AM, please contact night-coverage [email or web address] Password Jose Petersen 12/03/2012, 8:58 PM

## 2012-12-03 NOTE — Progress Notes (Addendum)
VASCULAR LAB PRELIMINARY  ARTERIAL  ABI completed:ABIs WNL.  Waveforms are normal.    RIGHT    LEFT    PRESSURE WAVEFORM  PRESSURE WAVEFORM  BRACHIAL 164 T BRACHIAL    DP   DP    AT 188 T AT 206 T  PT 167 T PT 161 T  PER   PER    GREAT TOE  Waveform shows dampened flow suggestive of decrease in arterial flow to digits. Unable to tolerate toe pressures on other digits GREAT TOE  NA    RIGHT LEFT  ABI 1.15 1.26     Caeli Linehan, RVT 12/03/2012, 3:24 PM

## 2012-12-04 ENCOUNTER — Inpatient Hospital Stay (HOSPITAL_COMMUNITY): Payer: Self-pay

## 2012-12-04 ENCOUNTER — Encounter (HOSPITAL_COMMUNITY): Payer: Self-pay | Admitting: Physician Assistant

## 2012-12-04 DIAGNOSIS — D62 Acute posthemorrhagic anemia: Secondary | ICD-10-CM

## 2012-12-04 DIAGNOSIS — I369 Nonrheumatic tricuspid valve disorder, unspecified: Secondary | ICD-10-CM

## 2012-12-04 DIAGNOSIS — I70269 Atherosclerosis of native arteries of extremities with gangrene, unspecified extremity: Secondary | ICD-10-CM

## 2012-12-04 DIAGNOSIS — I5021 Acute systolic (congestive) heart failure: Secondary | ICD-10-CM

## 2012-12-04 DIAGNOSIS — I509 Heart failure, unspecified: Secondary | ICD-10-CM

## 2012-12-04 LAB — BASIC METABOLIC PANEL
BUN: 25 mg/dL — ABNORMAL HIGH (ref 6–23)
CO2: 21 mEq/L (ref 19–32)
Chloride: 101 mEq/L (ref 96–112)
Creatinine, Ser: 1.53 mg/dL — ABNORMAL HIGH (ref 0.50–1.35)
GFR calc Af Amer: 60 mL/min — ABNORMAL LOW (ref >90)
Glucose, Bld: 140 mg/dL — ABNORMAL HIGH (ref 70–99)
Potassium: 4.3 mEq/L (ref 3.5–5.1)

## 2012-12-04 LAB — GLUCOSE, CAPILLARY
Glucose-Capillary: 126 mg/dL — ABNORMAL HIGH (ref 70–99)
Glucose-Capillary: 144 mg/dL — ABNORMAL HIGH (ref 70–99)

## 2012-12-04 LAB — TROPONIN I
Troponin I: <0.3 ng/mL (ref <0.30)
Troponin I: <0.3 ng/mL (ref <0.30)

## 2012-12-04 LAB — HEPATIC FUNCTION PANEL
ALT: 12 U/L (ref 0–53)
AST: 20 U/L (ref 0–37)
Albumin: 3.4 g/dL — ABNORMAL LOW (ref 3.5–5.2)
Alkaline Phosphatase: 96 U/L (ref 39–117)
Total Bilirubin: 0.8 mg/dL (ref 0.3–1.2)
Total Protein: 6.5 g/dL (ref 6.0–8.3)

## 2012-12-04 LAB — T3, FREE: T3, Free: 3.3 pg/mL (ref 2.3–4.2)

## 2012-12-04 LAB — T4, FREE: Free T4: 1.24 ng/dL (ref 0.80–1.80)

## 2012-12-04 LAB — FERRITIN: Ferritin: 25 ng/mL (ref 22–322)

## 2012-12-04 LAB — IRON AND TIBC
Saturation Ratios: 13 % — ABNORMAL LOW (ref 20–55)
TIBC: 354 ug/dL (ref 215–435)

## 2012-12-04 LAB — RETICULOCYTES
RBC.: 5.05 MIL/uL (ref 4.22–5.81)
Retic Ct Pct: 1.1 % (ref 0.4–3.1)

## 2012-12-04 LAB — URIC ACID: Uric Acid, Serum: 10.8 mg/dL — ABNORMAL HIGH (ref 4.0–7.8)

## 2012-12-04 MED ORDER — HYDROMORPHONE HCL PF 1 MG/ML IJ SOLN
1.0000 mg | INTRAMUSCULAR | Status: DC | PRN
  Administered 2012-12-04 – 2012-12-15 (×33): 1 mg via INTRAVENOUS
  Filled 2012-12-04 (×37): qty 1

## 2012-12-04 MED ORDER — PANTOPRAZOLE SODIUM 40 MG PO TBEC
40.0000 mg | DELAYED_RELEASE_TABLET | Freq: Once | ORAL | Status: AC
  Administered 2012-12-04: 40 mg via ORAL
  Filled 2012-12-04: qty 1

## 2012-12-04 MED ORDER — PANTOPRAZOLE SODIUM 40 MG PO TBEC: 40.0000 mg | DELAYED_RELEASE_TABLET | Freq: Two times a day (BID) | ORAL | Status: DC

## 2012-12-04 MED ORDER — DOPAMINE-DEXTROSE 3.2-5 MG/ML-% IV SOLN
2.5000 ug/kg/min | INTRAVENOUS | Status: DC
  Administered 2012-12-04 – 2012-12-06 (×2): 2.5 ug/kg/min via INTRAVENOUS
  Filled 2012-12-04 (×2): qty 250

## 2012-12-04 MED ORDER — HEPARIN (PORCINE) IN NACL 100-0.45 UNIT/ML-% IJ SOLN
1600.0000 [IU]/h | INTRAMUSCULAR | Status: DC
  Administered 2012-12-04 – 2012-12-06 (×3): 1600 [IU]/h via INTRAVENOUS
  Filled 2012-12-04 (×4): qty 250

## 2012-12-04 MED ORDER — PANTOPRAZOLE SODIUM 40 MG PO TBEC
40.0000 mg | DELAYED_RELEASE_TABLET | Freq: Two times a day (BID) | ORAL | Status: DC
  Administered 2012-12-04 – 2012-12-18 (×27): 40 mg via ORAL
  Filled 2012-12-04 (×27): qty 1

## 2012-12-04 MED ORDER — COLCHICINE 0.6 MG PO TABS
0.6000 mg | ORAL_TABLET | Freq: Two times a day (BID) | ORAL | Status: DC
  Administered 2012-12-05 – 2012-12-18 (×25): 0.6 mg via ORAL
  Filled 2012-12-04 (×29): qty 1

## 2012-12-04 MED ORDER — HYDRALAZINE HCL 25 MG PO TABS
25.0000 mg | ORAL_TABLET | Freq: Three times a day (TID) | ORAL | Status: DC
  Administered 2012-12-04 – 2012-12-08 (×12): 25 mg via ORAL
  Filled 2012-12-04 (×15): qty 1

## 2012-12-04 MED ORDER — CLONIDINE HCL 0.1 MG PO TABS
0.1000 mg | ORAL_TABLET | Freq: Four times a day (QID) | ORAL | Status: DC | PRN
  Administered 2012-12-05: 0.1 mg via ORAL
  Filled 2012-12-04 (×2): qty 1

## 2012-12-04 MED ORDER — FUROSEMIDE 10 MG/ML IJ SOLN
40.0000 mg | Freq: Every day | INTRAMUSCULAR | Status: DC
  Administered 2012-12-04 – 2012-12-06 (×3): 40 mg via INTRAVENOUS
  Filled 2012-12-04 (×4): qty 4

## 2012-12-04 MED ORDER — FENTANYL 25 MCG/HR TD PT72
25.0000 ug | MEDICATED_PATCH | TRANSDERMAL | Status: DC
  Administered 2012-12-04: 25 ug via TRANSDERMAL
  Filled 2012-12-04: qty 1

## 2012-12-04 MED ORDER — COLCHICINE 0.6 MG PO TABS
0.6000 mg | ORAL_TABLET | Freq: Three times a day (TID) | ORAL | Status: AC
  Administered 2012-12-04 (×3): 0.6 mg via ORAL
  Filled 2012-12-04 (×3): qty 1

## 2012-12-04 NOTE — Progress Notes (Addendum)
[facility]                                                                                Patient Demographics  Jose Petersen, is a 47 y.o. male  ZOX:[id number]  WJX:[id number]  DOB - [birth year]  Admit date - 12/03/2012  Admitting Physician Hollice Espy, MD  Outpatient Primary MD for the patient is Default, Provider, MD  LOS - 1   Chief Complaint  Patient presents with  . Shortness of Breath  . Foot Pain        Assessment & Plan      1. Massive edema mostly in his legs lungs relatively spared. Patient claims to have 50 pound weight gain, has chronic systolic CHF with EF of 15%, however has more than 300 protein in his urine. I question whether he has nephrotic syndrome, renal ultrasound ordered, have ordered SPEP, UPEP, 24-hour urine protein and creatinine collection, protein creatinine ratio in the urine, discussed the case with nephrologist Dr. Caryn Section who will see the patient on Monday. For now Lasix is held as his creatinine has gone up and he is in acute renal failure.   2. CAD with Chronic systolic heart failure with EF 15%. Case discussed with Dr. Patty Sermons who will see the patient shortly, patient appears to be related he compensated at least from the left heart standpoint, his edema is mostly peripheral, likely due to proteinuria plus minus right-sided heart failure, pulmonary artery pressures are mildly elevated at 43 MM of mercury. Defer CHF management to cardiology at this point. He has been placed on fluid restriction, ACE/ARB along with Lasix held due to acute renal insufficiency. Continue Coreg    3. Hypertension in poor control patient on Coreg, will add hydralazine along with when necessary Catapres as needed.    4. Acute renal failure with massive proteinuria. Renal studies as dictated in #1 above, case discussed with Dr. Caryn Section nephrologist to see the patient on Monday, renal ultrasound ordered along with SPEP, UPEP, 24-hour urine protein and  creatinine collection and protein creatinine ratio in urine, will hold Lasix and ACE/ARB for now. Serial BMPs.    5. Left foot gout, not much benefit from prednisone and colchicine in the past, cannot give him NSAIDs due to acute renal failure, will check a chest level and place him on colchicine, stable x-ray of left foot.   6. Right second and third toe, left source to discoloration. ABI stable, vascular surgery consult pending, echo free of any thrombus. We'll continue supportive care we'll await vascular surgery input.    7. DM type II. Agree with holding by mouth medications, continue sliding scale insulin.  Lab Results  Component Value Date   HGBA1C 7.5* 08/28/2012    CBG (last 3)   Basename 12/04/12 1153 12/04/12 0605 12/03/12 2132  GLUCAP 157* 126* 160*     8. Iron deficiency anemia chronic. No acute issues, continue her iron supplementation.     9. Elevated TSH at 11, will repeat TSH along with free T3-T4.    10. Mild leukocytosis- non-specific, could be due to recent steroid treatment, chest x-ray inconclusive, repeat 2 view chest x-ray in the morning, monitor  WBC and temperature trend.     Code Status: full  Family Communication: with the patient  Disposition Plan: Home   Procedures ABI, renal ultrasound, echo gram, SPEP, UPEP, protein creatinine ratio in urine   Consults  cardiology Dr. Patty Sermons, discussed with nephrologist Dr. Caryn Section consulted on 12/04/2012 and suggested he will see the patient on Monday, vascular surgery to see the patient has been called on 12-2012   DVT Prophylaxis  Lovenox   Lab Results  Component Value Date   PLT 440* 12/03/2012    Medications  Scheduled Meds:   . atorvastatin  10 mg Oral q1800  . carvedilol  3.125 mg Oral BID WC  . colchicine  0.6 mg Oral TID  . colchicine  0.6 mg Oral BID  . enoxaparin (LOVENOX) injection  40 mg Subcutaneous Q24H  . ferrous gluconate  324 mg Oral TID WC  . influenza  inactive virus  vaccine  0.5 mL Intramuscular Tomorrow-1000  . insulin aspart  0-9 Units Subcutaneous TID WC  . sodium chloride  3 mL Intravenous Q12H   Continuous Infusions:  PRN Meds:.sodium chloride, acetaminophen, morphine injection, ondansetron (ZOFRAN) IV, oxyCODONE-acetaminophen, sodium chloride  Antibiotics    Anti-infectives    None       Time Spent in minutes   45   Sigmond Patalano K M.D on 12/04/2012 at 12:01 PM  Between 7am to 7pm - Pager - [phone]  After 7pm go to [email or web address] - password [email or web address]  And look for the night coverage person covering for me after hours  [facility] Office  [phone]    Subjective:   [name] today has, No headache, No chest pain, No abdominal pain - No Nausea, No new weakness tingling or numbness, No Cough - SOB. Has right-sided second and third toe pain along with left foot pain from gout  Objective:   Filed Vitals:   12/04/12 0322 12/04/12 0443 12/04/12 0603 12/04/12 0606  BP: 149/99  160/105 152/104  Pulse: 71  75   Temp:   97.7 F (36.5 C)   TempSrc:   Oral   Resp: 20     Height:      Weight:  134.628 kg (296 lb 12.8 oz)    SpO2: 99%  98%     Wt Readings from Last 3 Encounters:  12/04/12 134.628 kg (296 lb 12.8 oz)  08/30/12 110.7 kg (244 lb 0.8 oz)  08/30/12 110.7 kg (244 lb 0.8 oz)     Intake/Output Summary (Last 24 hours) at 12/04/12 1201 Last data filed at 12/04/12 0842  Gross per 24 hour  Intake    783 ml  Output    900 ml  Net   -117 ml    Exam Awake Alert, Oriented X 3, No new F.N deficits, Normal affect [place].AT,PERRAL Supple Neck,No JVD, No cervical lymphadenopathy appriciated.  Symmetrical Chest wall movement, Good air movement bilaterally, CTAB RRR,No Gallops,Rubs or new Murmurs, No Parasternal Heave +ve B.Sounds, Abd Soft, Non tender, No organomegaly appriciated, No rebound - guarding or rigidity. No Cyanosis, 2-3+ edema in legs up to his waist, right second and third toes discolored and  cyanotic , left big toe mildly cyanotic at the tip   Data Review   Micro Results No results found for this or any previous visit (from the past 240 hour(s)).  Radiology Reports Dg Chest 2 View  12/03/2012  *RADIOLOGY REPORT*  Clinical Data: 47 year old male with swelling, weight gain, weakness.  CHEST - 2 VIEW  Comparison: 08/29/2012  and earlier.  Findings: Sequelae of CABG.  Stable median sternotomy wires.  Lower lung volumes.  Patchy opacity at the lung bases, greater on the left.  There is a small left pleural effusion.  No overt pulmonary edema.  No pneumothorax. Stable cardiomegaly and mediastinal contours.  Visualized tracheal air column is within normal limits. No acute osseous abnormality identified.  IMPRESSION: New small left pleural effusion.  Patchy left basilar opacity, favor atelectasis but developing pneumonia is difficult to exclude. No acute pulmonary edema.   Original Report Authenticated By: Erskine Speed, M.D.    Dg Foot Complete Left  12/03/2012  **ADDENDUM** CREATED: 12/03/2012 14:52:22  Incidental Achilles spur.  **END ADDENDUM** SIGNED BY: Elsie Stain, M.D.   12/03/2012  *RADIOLOGY REPORT*  Clinical Data: Swelling and pain in both feet.  History of gout.  LEFT FOOT - COMPLETE 3+ VIEW  Comparison: None.  Findings: There is no evidence of fracture or dislocation.  There is no evidence of arthropathy or other focal bony abnormality. There is diffuse soft tissue swelling without osseous erosion.  IMPRESSION: Soft tissue swelling.  No focal bony abnormality.   Original Report Authenticated By: Davonna Belling, M.D.    Dg Foot Complete Right  12/03/2012  *RADIOLOGY REPORT*  Clinical Data: Swelling and pain.  History of gout.  RIGHT FOOT COMPLETE - 3+ VIEW  Comparison: None.  Findings: Mild soft tissue swelling proximally and about the ankle. There is no evidence of fracture or dislocation.  There is no evidence of arthropathy or other focal bony abnormality.  No erosive lesion is  identified. Plantar are and Achilles spurs are noted.  IMPRESSION: Mild soft tissue swelling.  Focal bony abnormality.   Original Report Authenticated By: Davonna Belling, M.D.     CBC  Lab 12/03/12 2055 12/03/12 1253  WBC 12.5* 8.8  HGB 12.2* 11.0*  HCT 40.3 37.6*  PLT 440* 403*  MCV 75.0* 74.2*  MCH 22.7* 21.7*  MCHC 30.3 29.3*  RDW 17.5* 17.5*  LYMPHSABS -- --  MONOABS -- --  EOSABS -- --  BASOSABS -- --  BANDABS -- --    Chemistries   Lab 12/04/12 0837 12/04/12 0834 12/03/12 2055 12/03/12 1253  NA -- 138 -- 139  K -- 4.3 -- 3.7  CL -- 101 -- 102  CO2 -- 21 -- 20  GLUCOSE -- 140* -- 162*  BUN -- 25* -- 21  CREATININE -- 1.53* 1.44* 1.22  CALCIUM -- 9.3 -- 9.2  MG -- -- -- --  AST 20 -- -- --  ALT 12 -- -- --  ALKPHOS 96 -- -- --  BILITOT 0.8 -- -- --   ------------------------------------------------------------------------------------------------------------------ estimated creatinine clearance is 80.4 ml/min (by C-G formula based on Cr of 1.53). ------------------------------------------------------------------------------------------------------------------ No results found for this basename: HGBA1C:2 in the last 72 hours ------------------------------------------------------------------------------------------------------------------ No results found for this basename: CHOL:2,HDL:2,LDLCALC:2,TRIG:2,CHOLHDL:2,LDLDIRECT:2 in the last 72 hours ------------------------------------------------------------------------------------------------------------------  [facility] 12/03/12 2055  TSH 13.973*  T4TOTAL --  T3FREE --  THYROIDAB --   ------------------------------------------------------------------------------------------------------------------ No results found for this basename: VITAMINB12:2,FOLATE:2,FERRITIN:2,TIBC:2,IRON:2,RETICCTPCT:2 in the last 72 hours  Coagulation profile No results found for this basename: INR:5,PROTIME:5 in the last 168 hours  No  results found for this basename: DDIMER:2 in the last 72 hours  Cardiac Enzymes  Lab 12/04/12 0837 12/04/12 0253 12/03/12 2055  CKMB -- -- --  TROPONINI <0.30 <0.30 <0.30  MYOGLOBIN -- -- --   ------------------------------------------------------------------------------------------------------------------ No components found with this basename: POCBNP:3

## 2012-12-04 NOTE — Progress Notes (Signed)
Spot urine specimen for total protein and creatinine sent to lab for processing.  24 hour urine pending at this time and will be completed tomorrow afternoon.  Results in computer show duplicate results for spot urine and 24 hour urine at this time and 24 hour has not been competed and sent for processing.  Lab has been made aware and will follow up with supervisor to address these results. Nino Glow RN

## 2012-12-04 NOTE — Consult Note (Signed)
[facility] HISTORY AND PHYSICAL  Requesting Dr Malachi Bonds Reason for consult Ischemic toe History of Present Illness:  Patient is a 47 y.o. year old male who presents for evaluation of an ischemic toes.  ABI yesterday greater than 1 bilat.  Other medical problems include CAD, CHF, HTN, hyperlipid, duodenal ulcer, diabetes.  He was admitted with CHF.  Pt states he has had pain in the toes for several weeks slowly getting worse.  Noted that tips were dusky thought he may have broken them but they never got better.  Past Medical History  Diagnosis Date  . Coronary artery disease     CABG 4 vessel 2007 in [place], had mild symptoms  . Hypertension   . Gout     "multiple attacks before today" (12/03/2012)  . Diverticulitis   . Hyperlipidemia   . Duodenal ulcer 08/2012    bleeding ulcer  . Myocardial infarction 2007    "small one before OHS" (12/03/2012)  . Anginal pain 2007  . Type II diabetes mellitus 2013  . Shortness of breath     "all the time lately" (12/03/2012)  . History of blood transfusion 1965    "when I was born" (12/03/2012)  . Anxiety     "over the last couple weeks" (12/03/2012)  . Kidney stones ~ 1989; 10/2012  . Iron deficiency anemia     Hattie Perch 12/03/2012  . CHF (congestive heart failure) dx 08/2012  . Acute on chronic diastolic CHF (congestive heart failure)     Hattie Perch 12/03/2012    Past Surgical History  Procedure Date  . Coronary artery bypass graft 2007    CABG X 4  . Knee cartilage surgery 2009    "left" (12/03/2012)  . Esophagogastroduodenoscopy 08/28/2012    Procedure: ESOPHAGOGASTRODUODENOSCOPY (EGD);  Surgeon: Willis Modena, MD;  Location: [facility];  Service: Endoscopy;  Laterality: N/A;  . Tonsillectomy 1960's  . Knee arthroscopy 1990's-2000's    "3 on the left; all arthoscopies for meninscus tear" (12/03/2012)  . Cardiac catheterization ~ 2009  . Cystoscopy/retrograde/ureteroscopy/stone extraction with basket ?1989  . Cystoscopy with  retrograde pyelogram, ureteroscopy and stent placement 10/2012     Social History History  Substance Use Topics  . Smoking status: Former Smoker -- 1.0 packs/day for 24 years    Types: Cigarettes    Quit date: 12/03/2003  . Smokeless tobacco: Never Used  . Alcohol Use: Yes     Comment: 1/2/2014occasional social, 1 drink per year "if that"    Family History Family History  Problem Relation Age of Onset  . COPD Father   . Heart disease Father   . High blood pressure Sister     Allergies  Allergies  Allergen Reactions  . Toradol (Ketorolac Tromethamine) Hives  . Motrin (Ibuprofen) Other (See Comments)    Pt has ulcer     Current Facility-Administered Medications  Medication Dose Route Frequency Provider Last Rate Last Dose  . 0.9 %  sodium chloride infusion  250 mL Intravenous PRN Renae Fickle, MD      . acetaminophen (TYLENOL) tablet 650 mg  650 mg Oral Q4H PRN Renae Fickle, MD      . atorvastatin (LIPITOR) tablet 10 mg  10 mg Oral q1800 Renae Fickle, MD      . carvedilol (COREG) tablet 3.125 mg  3.125 mg Oral BID WC Renae Fickle, MD   3.125 mg at 12/04/12 0618  . enoxaparin (LOVENOX) injection 40 mg  40 mg Subcutaneous Q24H Renae Fickle,  MD   40 mg at 12/03/12 2125  . ferrous gluconate (FERGON) tablet 324 mg  324 mg Oral TID WC Renae Fickle, MD      . furosemide (LASIX) injection 40 mg  40 mg Intravenous Q12H Renae Fickle, MD   40 mg at 12/03/12 2119  . influenza  inactive virus vaccine (FLUZONE/FLUARIX) injection 0.5 mL  0.5 mL Intramuscular Tomorrow-1000 Leroy Sea, MD      . insulin aspart (novoLOG) injection 0-9 Units  0-9 Units Subcutaneous TID WC Renae Fickle, MD   1 Units at 12/04/12 [phone]  . lisinopril (PRINIVIL,ZESTRIL) tablet 20 mg  20 mg Oral Daily Renae Fickle, MD   20 mg at 12/03/12 2119  . morphine 2 MG/ML injection 2 mg  2 mg Intravenous Q2H PRN Renae Fickle, MD   2 mg at 12/04/12 0304  . ondansetron (ZOFRAN) injection 4  mg  4 mg Intravenous Q6H PRN Renae Fickle, MD   4 mg at 12/03/12 2156  . oxyCODONE-acetaminophen (PERCOCET/ROXICET) 5-325 MG per tablet 1-2 tablet  1-2 tablet Oral Q4H PRN Renae Fickle, MD   2 tablet at 12/04/12 0514  . potassium chloride SA (K-DUR,KLOR-CON) CR tablet 20 mEq  20 mEq Oral BID Renae Fickle, MD   20 mEq at 12/03/12 2321  . sodium chloride 0.9 % injection 3 mL  3 mL Intravenous Q12H Renae Fickle, MD   3 mL at 12/03/12 2322  . sodium chloride 0.9 % injection 3 mL  3 mL Intravenous PRN Renae Fickle, MD        ROS:   General:  No weight loss, Fever, chills  HEENT: No recent headaches, no nasal bleeding, no visual changes, no sore throat  Neurologic: No dizziness, blackouts, seizures. No recent symptoms of stroke or mini- stroke. No recent episodes of slurred speech, or temporary blindness.  Cardiac: No recent episodes of chest pain/pressure, + shortness of breath at rest.  + shortness of breath with exertion.  Denies history of atrial fibrillation or irregular heartbeat  Vascular: No history of rest pain in feet.  No history of claudication.  No history of non-healing ulcer, No history of DVT   Pulmonary: No home oxygen, no productive cough, no hemoptysis  Musculoskeletal:  [ ]  Arthritis, [ ]  Low back pain,  [x ] Joint pain  Hematologic:No history of hypercoagulable state.  No history of easy bleeding.  No history of anemia  Gastrointestinal: No hematochezia or melena,  No gastroesophageal reflux, no trouble swallowing  Urinary: [x ] chronic Kidney disease, [ ]  on HD - [ ]  MWF or [ ]  TTHS, [ ]  Burning with urination, [ ]  Frequent urination, [ ]  Difficulty urinating;   Skin: No rashes  Psychological: No history of anxiety,  No history of depression   Physical Examination  Filed Vitals:   12/04/12 0322 12/04/12 0443 12/04/12 0603 12/04/12 0606  BP: 149/99  160/105 152/104  Pulse: 71  75   Temp:   97.7 F (36.5 C)   TempSrc:   Oral   Resp: 20       Height:      Weight:  296 lb 12.8 oz (134.628 kg)    SpO2: 99%  98%     Body mass index is 43.83 kg/(m^2).  General:  Alert and oriented, nauseous HEENT: Normal Abdomen: Soft, non-tender, non-distended, no mass, obese Skin: No rash, toes 4 and 5 dusky at tip right foot Extremity Pulses:  2+ femoral, dorsalis pedis, posterior tibial pulses bilaterally diffuse lower extremity  edema Musculoskeletal: No deformity Neurologic: Upper and lower extremity motor 5/5 and symmetric   ASSESSMENT: Ischemic toes right foot, palpable pulses most likely small vessel disease currently undergoing treatment for CHF and renal dysfunction   PLAN:  Most likely will need amputation of toes right foot when more medically stable. Will recheck early next week.  Dr Arbie Cookey on call this weekend if questions or change in condition.  Fabienne Bruns, MD [facility] [place] Office: [phone] Pager: [phone]

## 2012-12-04 NOTE — Progress Notes (Signed)
Pt BP 160/105. MD notified. MD said OK to give Coreg and Lasix early. Coreg given and Lasix notified for from pharmacy due to med not being available on floor or in [facility]. RN waited on Lasix until end of shift, and then passed on to oncoming RN. Will continue to monitor.

## 2012-12-04 NOTE — Progress Notes (Signed)
Pt D-dimer 2.73.  Pt denies any chest pain or SOB at this time.  MD has been made aware.  Will continue to monitor. Nino Glow RN

## 2012-12-04 NOTE — Progress Notes (Signed)
  Echocardiogram 2D Echocardiogram has been performed.  Jose Petersen 12/04/2012, 11:18 AM

## 2012-12-04 NOTE — Progress Notes (Signed)
ANTICOAGULATION CONSULT NOTE - Initial Consult  Pharmacy Consult for UFH Indication: r/o PE  Allergies  Allergen Reactions  . Toradol (Ketorolac Tromethamine) Hives  . Motrin (Ibuprofen) Other (See Comments)    Pt has ulcer    Patient Measurements: Height: 5\' 9"  (175.3 cm) Weight: 296 lb 12.8 oz (134.628 kg) (scale A) IBW/kg (Calculated) : 70.7  Heparin Dosing Weigh: 103kg  Vital Signs: Temp: 97.4 F (36.3 C) (01/03 1422) Temp src: Oral (01/03 1422) BP: 170/102 mmHg (01/03 1422) Pulse Rate: 60  (01/03 1422)  Labs:  Basename 12/04/12 0837 12/04/12 0834 12/04/12 0253 12/03/12 2055 12/03/12 1253  HGB -- -- -- 12.2* 11.0*  HCT -- -- -- 40.3 37.6*  PLT -- -- -- 440* 403*  APTT -- -- -- -- --  LABPROT -- -- -- -- --  INR -- -- -- -- --  HEPARINUNFRC -- -- -- -- --  CREATININE -- 1.53* -- 1.44* 1.22  CKTOTAL -- -- -- -- --  CKMB -- -- -- -- --  TROPONINI <0.30 -- <0.30 <0.30 --    Estimated Creatinine Clearance: 80.4 ml/min (by C-G formula based on Cr of 1.53).   Medical History: Past Medical History  Diagnosis Date  . Coronary artery disease     a. 4V CABG 2007 in [place], possible mild MI. b. Repeat cath ~2009-2010 pre-op knee surgery that was reportedly "clean" per pt.  . Hypertension   . Gout   . Diverticulitis   . Hyperlipidemia   . GI bleed     a. Anemia/GIB with bleeding duodenal ulcer 08/2012.  Marland Kitchen Myocardial infarction 2007    "small one before OHS" (12/03/2012)  . Type II diabetes mellitus 2013  . Anxiety     "over the last couple weeks" (12/03/2012)  . Kidney stones ~ 1989; 10/2012  . Iron deficiency anemia     a. GIB 08/2012.  . Cardiomyopathy     a. EF 45-50% 08/2012.    Medications:  Scheduled:    . atorvastatin  10 mg Oral q1800  . carvedilol  3.125 mg Oral BID WC  . colchicine  0.6 mg Oral TID  . colchicine  0.6 mg Oral BID  . fentaNYL  25 mcg Transdermal Q72H  . ferrous gluconate  324 mg Oral TID WC  . [COMPLETED] furosemide  20 mg Intravenous  Once  . furosemide  40 mg Intravenous Daily  . hydrALAZINE  25 mg Oral Q8H  . [COMPLETED]  HYDROmorphone (DILAUDID) injection  1 mg Intravenous Once  . [COMPLETED]  HYDROmorphone (DILAUDID) injection  1 mg Intravenous Once  . influenza  inactive virus vaccine  0.5 mL Intramuscular Tomorrow-1000  . insulin aspart  0-9 Units Subcutaneous TID WC  . [COMPLETED] labetalol  20 mg Intravenous Once  . [COMPLETED] ondansetron (ZOFRAN) IV  4 mg Intravenous Once  . [COMPLETED] ondansetron (ZOFRAN) IV  4 mg Intravenous Once  . pantoprazole  40 mg Oral Once  . pantoprazole  40 mg Oral BID  . sodium chloride  3 mL Intravenous Q12H  . [DISCONTINUED] enoxaparin (LOVENOX) injection  40 mg Subcutaneous Q24H  . [DISCONTINUED] furosemide  40 mg Intravenous Q12H  . [DISCONTINUED] lisinopril  20 mg Oral Daily  . [DISCONTINUED] pantoprazole  40 mg Oral BID AC  . [DISCONTINUED] pantoprazole  40 mg Oral BID WC  . [DISCONTINUED] potassium chloride  20 mEq Oral BID   Infusions:    . DOPamine 2.5 mcg/kg/min (12/04/12 1447)  . [DISCONTINUED] sodium chloride Stopped (12/03/12 1855)  . [  DISCONTINUED] nitroGLYCERIN      Assessment: 47 y/o male patient admitted with foot pain and sob, now with positive d-dimer requiring anticoagulation for r/o PE. MD request no bolus given gib 9/13. Received lovenox 40mg  last pm at 2100, not much impact on xa level given morbid obesity.  Goal of Therapy:  Heparin level 0.3-0.7 units/ml Monitor platelets by anticoagulation protocol: Yes   Plan:  Begin heparin infusion at 1600 units/hr and check 6 hour heparin level with daily cbc and heparin level.  Verlene Mayer, PharmD, BCPS Pager [phone] 12/04/2012,3:35 PM

## 2012-12-04 NOTE — Progress Notes (Signed)
Patient referred to CSW to assist with an Advanced Directive. Met with patient this evening; his mother as in his room and he gave permission for CSW to continue to meet with him. Explained purpose of CSW's referral and he denied that he wanted any information on an Advanced Directive.  The form and it's purpose was briefly discussed and patient agreed to accept the document but he is not interested in completion of the form at this time. CSW offered support and to answer any questions if needed. He was appreciative of CSW's visit. No further CSW needs identified. CSW signing off. Lorri Frederick. [place]  [phone]

## 2012-12-04 NOTE — Significant Event (Addendum)
Discussed d-dimer of 2.73 with Dr. Patty Sermons. We will go ahead and start heparin per pharmacy without bolus at this time. Add BID PPI for GI prophylaxis. We would ideally like to do CT angio instead of VQ scan given abnormal CXR/weight so will treat empirically with blood thinning agent. We also considered possibility of embolic phenomena to toes from severe LV dysfunction and heparin will cover this as well. Follow Cr for possible future CT angio. Will also get LE dopplers. Dayna Dunn PA-C

## 2012-12-04 NOTE — Consult Note (Signed)
CARDIOLOGY CONSULT NOTE  Patient ID: Jose Petersen, MRN: [id number], DOB/AGE: [birth year] 49 y.o. Admit date: 12/03/2012   Date of Consult: 12/04/2012 Primary Physician: Default, Provider, MD Primary Cardiologist: New Jose adm but had OP new pt appointment scheduled 1/29 with [facility] in [place]  Chief Complaint: foot pain, SOB, rapid 50 lb weight gain over 2 weeks Reason for Consult: volume overload, EF 15%, RV dysfunction (with prior EF in 08/2012 of 45-50% and normal RV)  HPI: Jose Petersen is a 47 y/o M with history of obesity, CAD/MI s/p CABG in [place] 2007 (repeat cath 2009 pre-op knee that was clean per pt), HTN, DM and admission 08/2012 for profound anemia/GIB secondary to bleeding duodenal ulcer. His aspirin was stopped at that time. During that admission his troponin bumped to 0.59 felt secondary to demand ischemia. 2D echo had poor images even with definity but showed EF 45-50%, mild LVH, normal RV, PA pressure . He had an appointment to establish care with cardiology pending later Jose month with Dr. Jens Som.  He presented to [facility] yesterday complaining of foot pain, progressive SOB, and 50lb weight gain. 2D echocardiogram today today demonstrates EF 15% with diffuse HK, doppler parameters consistent with restrictive physiology with LVEDP , RV moderately dilated with mod-severely reduced RV systolic function, PA pressure . For the past 1 month, he has been treated by PCP for gout flare with pain in his L foot first 3 toes and dorsum, as well as pain in R foot 2nd-4th toes with discoloration in his 3rd/4th toes. Since he couldn't take NSAIDS due to recent ulcer, he was prescribed prednisone/colchicine but with little relief. ABIs here were unremarkable but the R great toe had abnormal waveform and vascular is evaluating him. About 2 weeks ago, he also began to notice his weight increasing tremendously correlating with progressive DOE. Baseline weight = 245, is 296  today. He was started on Lasix by PCP 2 days ago with increase in baseline urination but did not feel significant symptom improvement. He also describes abdominal bloating, decreased appetite, significant LEE, and orthopnea. He has not had any chest pain either at rest or exertional. He does report a febrile URI 2-3 weeks ago for 5 days with Tmax 102 which he self-treated OTC that has since resolved. He denies dietary indiscretion or med noncompliance.  In the ER, BP elevated as high as 183/125. He reports recent BP's at home running 130/85. CXR demonstrated small L pleural eff, patchy L basilar opacity favoring atx over PNA. Labs show neg troponin x 3, Hgb 11.0 microcytic with plt 403, TSH 13, UA with significant proteinuria at >300, and Cr 1.22 on admit-> 1.53 today. pBNP [id number]. He has received 60mg  total IV Lasix last night as well as 20mg  Labetalol for elevated BP. He does feel somewhat better today. Subsequent doses of Lasix and ACEI were held due to rising BUN/Cr Jose AM.  Past Medical History  Diagnosis Date  . Coronary artery disease     CABG 4 vessel 2007 in [place], had mild symptoms  . Hypertension   . Gout   . Diverticulitis   . Hyperlipidemia   . GI bleed     a. Anemia/GIB with bleeding duodenal ulcer 08/2012.  Marland Kitchen Myocardial infarction 2007    "small one before OHS" (12/03/2012)  . Type II diabetes mellitus 2013  . Anxiety     "over the last couple weeks" (12/03/2012)  . Kidney stones ~ 1989; 10/2012  . Iron deficiency anemia  a. GIB 08/2012.  Marland Kitchen CHF (congestive heart failure) dx 08/2012      Most Recent Cardiac Studies: 2D Echo Study Conclusions 08/2012 - Left ventricle: MIld diffuse hypokinesis. Poor images even with Definity,. The cavity size was normal. Wall thickness was increased in a pattern of mild LVH. Systolic function was mildly reduced. The estimated ejection fraction was in the range of 45% to 50%. - Left atrium: The atrium was moderately to severely dilated. -  Pulmonary arteries: PA peak pressure: 55mm Hg (S).   He reports being treated for vol overload in the distant past  Surgical History:  Past Surgical History  Procedure Date  . Coronary artery bypass graft 2007    CABG X 4  . Knee cartilage surgery 2009    "left" (12/03/2012)  . Esophagogastroduodenoscopy 08/28/2012    Procedure: ESOPHAGOGASTRODUODENOSCOPY (EGD);  Surgeon: Willis Modena, MD;  Location: [facility];  Service: Endoscopy;  Laterality: N/A;  . Tonsillectomy 1960's  . Knee arthroscopy 1990's-2000's    "3 on the left; all arthoscopies for meninscus tear" (12/03/2012)  . Cardiac catheterization ~ 2009  . Cystoscopy/retrograde/ureteroscopy/stone extraction with basket ?1989  . Cystoscopy with retrograde pyelogram, ureteroscopy and stent placement 10/2012     Home Meds: Prior to Admission medications   Medication Sig Start Date End Date Taking? Authorizing Provider  carvedilol (COREG) 6.25 MG tablet Take 1 tablet (6.25 mg total) by mouth 2 (two) times daily with a meal. 08/31/12  Yes Vassie Loll, MD  colchicine 0.6 MG tablet Take 1 tablet (0.6 mg total) by mouth 2 (two) times daily. 07/18/12 07/18/13 Yes Hannah Muthersbaugh, PA-C  ferrous gluconate (FERGON) 216 MG tablet Take 1 tablet (216 mg total) by mouth 3 (three) times daily with meals. 08/31/12  Yes Vassie Loll, MD  glucose blood test strip Use as instructed 08/31/12  Yes Vassie Loll, MD  glucose monitoring kit (FREESTYLE) monitoring kit 1 each by Does not apply route as needed for other. 08/31/12  Yes Vassie Loll, MD  Lancets (FREESTYLE) lancets Use as instructed 08/31/12  Yes Vassie Loll, MD  lisinopril (PRINIVIL,ZESTRIL) 20 MG tablet Take 20 mg by mouth daily.   Yes Historical Provider, MD  metFORMIN (GLUCOPHAGE) 500 MG tablet Take 1 tablet (500 mg total) by mouth 2 (two) times daily with a meal. 08/31/12  Yes Vassie Loll, MD  rosuvastatin (CRESTOR) 10 MG tablet Take 10 mg by mouth daily.   Yes Historical Provider, MD     Inpatient Medications:     . atorvastatin  10 mg Oral q1800  . carvedilol  3.125 mg Oral BID WC  . enoxaparin (LOVENOX) injection  40 mg Subcutaneous Q24H  . ferrous gluconate  324 mg Oral TID WC  . influenza  inactive virus vaccine  0.5 mL Intramuscular Tomorrow-1000  . insulin aspart  0-9 Units Subcutaneous TID WC  . sodium chloride  3 mL Intravenous Q12H    Allergies:  Allergies  Allergen Reactions  . Toradol (Ketorolac Tromethamine) Hives  . Motrin (Ibuprofen) Other (See Comments)    Pt has ulcer    History   Social History  . Marital Status: Divorced    Spouse Name: N/A    Number of Children: N/A  . Years of Education: N/A   Occupational History  . Not on file.   Social History Main Topics  . Smoking status: Former Smoker -- 1.0 packs/day for 24 years    Types: Cigarettes    Quit date: 12/03/2003  . Smokeless tobacco: Never Used  .  Alcohol Use: Yes     Comment: 1/2/2014occasional social, 1 drink per year "if that"  . Drug Use: No  . Sexually Active: Not Currently   Other Topics Concern  . Not on file   Social History Narrative   Lives in a house without stairs.  Lives with his mom to be with her when his father got sick to help with the house.  He does not use a cane or walker and drives.  He previously worked out a [employer] about 6 days per week until a month ago.   Has insurance.  His primary care office is [facility].  Dr. Jens Som is Cardiologist.       Family History  Problem Relation Age of Onset  . COPD Father   . Heart disease Father   . High blood pressure Sister      Review of Systems: General: negative for  night sweats. See above Cardiovascular: see above Dermatological: negative for rash Respiratory: negative for cough or wheezing Urologic: negative for hematuria Abdominal: negative for diarrhea, bright red blood per rectum, melena, or hematemesis. Had nausea/vomiting after being given pain med yesterday. Neurologic:  negative for visual changes, syncope, or dizziness All other systems reviewed and are otherwise negative except as noted above.  Labs:  [facility] 12/04/12 0253 12/03/12 2055  CKTOTAL -- --  CKMB -- --  TROPONINI <0.30 <0.30   Lab Results  Component Value Date   WBC 12.5* 12/03/2012   HGB 12.2* 12/03/2012   HCT 40.3 12/03/2012   MCV 75.0* 12/03/2012   PLT 440* 12/03/2012     Lab 12/03/12 2055 12/03/12 1253  NA -- 139  K -- 3.7  CL -- 102  CO2 -- 20  BUN -- 21  CREATININE 1.44* --  CALCIUM -- 9.2  PROT -- --  BILITOT -- --  ALKPHOS -- --  ALT -- --  AST -- --  GLUCOSE -- 162*   Lab Results  Component Value Date   CHOL 152 08/29/2012   HDL 19* 08/29/2012   LDLCALC 95 08/29/2012   TRIG 192* 08/29/2012    Radiology/Studies:  Dg Chest 2 View 12/03/2012  *RADIOLOGY REPORT*  Clinical Data: 47 year old male with swelling, weight gain, weakness.  CHEST - 2 VIEW  Comparison: 08/29/2012 and earlier.  Findings: Sequelae of CABG.  Stable median sternotomy wires.  Lower lung volumes.  Patchy opacity at the lung bases, greater on the left.  There is a small left pleural effusion.  No overt pulmonary edema.  No pneumothorax. Stable cardiomegaly and mediastinal contours.  Visualized tracheal air column is within normal limits. No acute osseous abnormality identified.  IMPRESSION: New small left pleural effusion.  Patchy left basilar opacity, favor atelectasis but developing pneumonia is difficult to exclude. No acute pulmonary edema.   Original Report Authenticated By: Erskine Speed, M.D.    Dg Foot Complete Left 12/03/2012  **ADDENDUM** CREATED: 12/03/2012 14:52:22  Incidental Achilles spur.  **END ADDENDUM** SIGNED BY: Elsie Stain, M.D.   12/03/2012  *RADIOLOGY REPORT*  Clinical Data: Swelling and pain in both feet.  History of gout.  LEFT FOOT - COMPLETE 3+ VIEW  Comparison: None.  Findings: There is no evidence of fracture or dislocation.  There is no evidence of arthropathy or other focal bony  abnormality. There is diffuse soft tissue swelling without osseous erosion.  IMPRESSION: Soft tissue swelling.  No focal bony abnormality.   Original Report Authenticated By: Davonna Belling, M.D.  Dg Foot Complete Right 12/03/2012  *RADIOLOGY REPORT*  Clinical Data: Swelling and pain.  History of gout.  RIGHT FOOT COMPLETE - 3+ VIEW  Comparison: None.  Findings: Mild soft tissue swelling proximally and about the ankle. There is no evidence of fracture or dislocation.  There is no evidence of arthropathy or other focal bony abnormality.  No erosive lesion is identified. Plantar are and Achilles spurs are noted.  IMPRESSION: Mild soft tissue swelling.  Focal bony abnormality.   Original Report Authenticated By: Davonna Belling, M.D.     EKG: NSR 76bpm nonspecific ST-T changes  Physical Exam: Blood pressure 152/104, pulse 75, temperature 97.7 F (36.5 C), temperature source Oral, resp. rate 20, height 5\' 9"  (1.753 m), weight 296 lb 12.8 oz (134.628 kg), SpO2 98.00%. General: Well developed, well nourished obese WM in no acute distress. Head: Normocephalic, atraumatic, sclera non-icteric, no xanthomas, nares are without discharge. Neck: Negative for carotid bruits. JVD difficult to assess with body habitus when lying flat but does appear elevated when patient is sitting up. Lungs: Slightly diminished BS but no wheezes rales or rhonchi. Breathing is unlabored. Heart: RRR with S1 S2. No murmurs, rubs, or gallops appreciated. Abdomen: Soft, rounded, somewhat distended with normoactive bowel sounds. No hepatomegaly. No rebound/guarding. No obvious abdominal masses. Msk:  Strength and tone appear normal for age. Extremities: No clubbing or cyanosis. Healing excoriations noted on LE. 2+ pitting edema bilateral LE up to knees. Tips of R 3rd/4th plantar aspect of toes discolored grey-purple. Dorsum of both feet are tender to palpation, L foot dorsum is slightly erythematous. Distal pedal pulses difficult to assess  given pt's tolerance to pressure on top of feet but able to be palpated. Neuro: Alert and oriented X 3. No facial asymmetry. No focal deficit. Moves all extremities spontaneously. Psych:  Responds to questions appropriately with a normal affect.   Assessment and Plan:   1. Foot pain with discolored toes, recently treated with prednisone/colchicine for gout 2. Acute systolic CHF with new cardiomyopathy - EF 15%, RV dysfunction as well by echo Jose admission 3. Recent febrile URI 2-3 weeks ago 4. Proteinuria 5. CAD s/p CABG 2007, mild troponin bump 08/2012 in setting of profound anemia 6. Anemia/GIB 2/2 duodenal ulcer 08/2012 with aspirin stopped at that time 7. Diabetes mellitus 8. Acute renal insufficiency 9.  Elevated TSH 10. HTN   Patient seen and examined with Dr. Patty Sermons. The cause of his cardiomyopathy is unclear. He also has other curious findings including significant proteinuria, acute renal insufficiency, discolored toes, and mild hematologic abnormalities. Could Jose be a myocarditis related to his recent febrile URI? Fluid retention could also be exacerbated by prednisone, but 50 lbs in 2 weeks is impressive. He will also likely require cath to r/o progressive CAD. Plan: - initiate dopamine at 2.31mcg/kg/min in hopes to help with renal function - Lasix 40mg  IV daily, following Cr closely - primary team has ordered SPEP, UPEP, renal US, and protein assessments given his proteinuria - continue BB - OK to keep holding ACEI for now with elevated Cr - check hemoccults/anemia panel - check d-dimer given RV dysfunction and LEE - make NPO after midnight for Monday, although rounding team Jose weekend to assess him for fitness for cath on Monday - consider cardiac MRI to eval for infiltrative disease - per notes, vascular has been consulted regarding toe evaluation - thyroid function testing has already been ordered - will follow with you  Signed, Dayna Dunn PA-C 12/04/2012, 9:45  AM Jose  patient was seen with Ronie Spies PAC.  Jose man presents a very challenging history.  He has a background history of ischemic heart disease and has had previous CABG in 2007.  His last cardiac catheterization was in 2009 and was done preoperatively prior to knee surgery and was reportedly negative at that time.  Jose occurred in [place].  The patient has not been experiencing any recurrent symptoms of angina pectoris or known ischemic heart disease.  In September 2013 he was admitted with profound anemia and a GI bleed secondary to bleeding duodenal ulcer in his aspirin was stopped at that time.  He had an echocardiogram done at that time with poor images that showed an ejection fraction of 45-50% and a PA pressure of 55 mm Hg. he had an appointment to see Dr. Jens Som pending for later Jose month.  The patient was admitted yesterday because of rapid and unexplained weight gain associated with marked peripheral edema and worsening dyspnea.  His weight has gone up to 50 pounds in the past several weeks despite decreased appetite and decreased fluid intake.  He has not been experiencing chest pain but has had orthopnea and increased exertional dyspnea.  He has also been experiencing severe and unrelenting bilateral foot pain and has been treated for gout by his primary care physician.  Because he cannot take nonsteroidal anti-inflammatories because of his peptic ulcer disease, he has been treated with colchicine and with prednisone Dosepaks.  About 3 weeks ago the patient had what appeared to be a severe viral illness characterized by 5 days of fever malaise and profound fatigue.  It was subsequent to that that his peripheral edema and increased dyspnea and weight gain started. His chest x-ray was reviewed by me and shows mild cardiomegaly and mild pulmonary vascular congestion.  EKG reviewed by me shows normal sinus rhythm and nonspecific T-wave flattening but no acute ischemic changes.  His voltage  is normal. His echocardiogram done today is as follows:  [facility]* *[facility]* [address] [place], [place] [address] [phone]  ------------------------------------------------------------ Transthoracic Echocardiography  Patient: Jose Petersen, Jose Petersen MR #: [id number] Study Date: 12/04/2012 Gender: M Age: 84 Height: 175.3cm Weight: 134.6kg BSA: 2.63m^2 Pt. Status: Room: 4711  PERFORMING [place], [facility] ADMITTING [place], [place] SONOGRAPHER Cathie Beams ATTENDING Thedore Mins, Prashant Barnett Hatter, Mackenzie cc:  ------------------------------------------------------------ LV EF: 15%  ------------------------------------------------------------ Indications: Previous study 08/28/2012. CHF - 428.0.  ------------------------------------------------------------ History: PMH: Obesity. Extremity edema. Acute respiratory failure with hypoxia. Dyspnea. Coronary artery disease. Risk factors: Diabetes mellitus.  ------------------------------------------------------------ Study Conclusions  - Left ventricle: The cavity size was normal. Wall thickness was normal. The estimated ejection fraction was 15%. Diffuse hypokinesis. Doppler parameters are consistent with restrictive physiology, indicative of decreased left ventricular diastolic compliance and/or increased left atrial pressure. E/medial e' > 15 suggests LV end diastolic pressure at least 20 mmHg. - Aortic valve: There was no stenosis. - Mitral valve: No significant regurgitation. - Left atrium: The atrium was mildly to moderately dilated. - Right ventricle: The cavity size was moderately dilated. Systolic function was moderately to severely reduced. - Right atrium: The atrium was mildly to moderately dilated. - Tricuspid valve: Peak RV-RA gradient:77mm Hg (S). - Pulmonary arteries: PA peak pressure: 43mm Hg (S). - Systemic veins: IVC was not visualized. Impressions:  - The LV  was normal in size but there was severe systolic dysfunction, EF 15%. Global hypokinesis. Restrictive diastolic function with evidence for elevated LV filling pressure. The RV was moderately  dilated with moderate to severe systolic dysfunction. No significant valvular abnormalities.  In summary, Jose Petersen presents with unexplained significant deterioration of left ventricular systolic function since prior echocardiogram September 2013.  The question as to whether Jose might represent a cardiomyopathy secondary to previous myocarditis secondary to his viral illness 3 weeks ago.  Worsening of his ischemic heart disease seems less likely with lack of symptoms of recurrent angina and with no evidence of elevated troponin and no ischemic changes by EKG.  Also concerning is his proteinuria and a question of possible nephrotic syndrome is being evaluated.  Finally the cause of his bilateral foot pain with ecchymotic areas on 2 of his toes of the right foot is presently unexplained and raises question of possible vasculitis.  Consideration of full anticoagulation is discussed in view of his low ejection fraction by echo although his heart is not dilated and in view of recent bleeding ulcer we'll hold off on full anticoagulation at Jose time.  A vascular surgery consult is pending.  A D dimer is pending. Therapeutically we will treat him over the weekend with renal dose dopamine for his acute systolic congestive heart failure with ejection fraction 15% and we will tentatively plan for right and left heart cardiac catheterization for Monday if his renal function improves.  Presently creatinine is 1.5.  We can also consider cardiac MRI at some point in the future to further evaluate what may be an acute nonischemic cardiomyopathy.  Evaluation of his possible nephrotic syndrome as per primary care team.

## 2012-12-05 ENCOUNTER — Inpatient Hospital Stay (HOSPITAL_COMMUNITY): Payer: Self-pay

## 2012-12-05 DIAGNOSIS — I999 Unspecified disorder of circulatory system: Secondary | ICD-10-CM

## 2012-12-05 DIAGNOSIS — R609 Edema, unspecified: Secondary | ICD-10-CM

## 2012-12-05 LAB — PROTEIN, URINE, 24 HOUR
Collection Interval-UPROT: 24 hours
Protein, 24H Urine: 2397 mg/d — ABNORMAL HIGH (ref 50–100)
Urine Total Volume-UPROT: 4700 mL

## 2012-12-05 LAB — BASIC METABOLIC PANEL
CO2: 23 mEq/L (ref 19–32)
Chloride: 96 mEq/L (ref 96–112)
Creatinine, Ser: 1.35 mg/dL (ref 0.50–1.35)
Glucose, Bld: 118 mg/dL — ABNORMAL HIGH (ref 70–99)

## 2012-12-05 LAB — GLUCOSE, CAPILLARY: Glucose-Capillary: 130 mg/dL — ABNORMAL HIGH (ref 70–99)

## 2012-12-05 LAB — CREATININE, URINE, 24 HOUR
Collection Interval-UCRE24: 24 hours
Creatinine, 24H Ur: 1928 mg/d (ref 800–2000)
Urine Total Volume-UCRE24: 4700 mL

## 2012-12-05 LAB — MAGNESIUM: Magnesium: 1.8 mg/dL (ref 1.5–2.5)

## 2012-12-05 LAB — CBC
HCT: 44.4 % (ref 39.0–52.0)
Hemoglobin: 13.5 g/dL (ref 13.0–17.0)
WBC: 12.7 10*3/uL — ABNORMAL HIGH (ref 4.0–10.5)

## 2012-12-05 MED ORDER — HYDROCODONE-ACETAMINOPHEN 10-325 MG PO TABS
1.0000 | ORAL_TABLET | ORAL | Status: DC | PRN
  Administered 2012-12-05 – 2012-12-07 (×11): 1 via ORAL
  Filled 2012-12-05 (×12): qty 1

## 2012-12-05 MED ORDER — ASPIRIN 81 MG PO CHEW
81.0000 mg | CHEWABLE_TABLET | Freq: Every day | ORAL | Status: DC
  Administered 2012-12-05 – 2012-12-18 (×13): 81 mg via ORAL
  Filled 2012-12-05 (×13): qty 1

## 2012-12-05 NOTE — Progress Notes (Signed)
ANTICOAGULATION CONSULT NOTE - Follow Up Consult  Pharmacy Consult for heparin Indication: r/o PE  Labs:  [facility] 12/05/12 0129 12/04/12 0837 12/04/12 0834 12/04/12 0253 12/03/12 2055 12/03/12 1253  HGB 13.5 -- -- -- 12.2* --  HCT 44.4 -- -- -- 40.3 37.6*  PLT 414* -- -- -- 440* 403*  APTT -- -- -- -- -- --  LABPROT -- -- -- -- -- --  INR -- -- -- -- -- --  HEPARINUNFRC 0.39 -- -- -- -- --  CREATININE 1.35 -- 1.53* -- 1.44* --  CKTOTAL -- -- -- -- -- --  CKMB -- -- -- -- -- --  TROPONINI -- <0.30 -- <0.30 <0.30 --    Assessment/Plan:  47yo male therapeutic on heparin with initial dosing for r/o PE.  Will continue gtt at current rate and confirm stable with additional level.  Colleen Can PharmD BCPS 12/05/2012,2:36 AM

## 2012-12-05 NOTE — Progress Notes (Signed)
[facility]                                                                                Patient Demographics  Jose Petersen, is a 47 y.o. male  JXB:[id number]  [id number]:[id number]  DOB - 20-Aug-1966  Admit date - 12/03/2012  Admitting Physician Hollice Espy, MD  Outpatient Primary MD for the patient is Default, Provider, MD  LOS - 2   Chief Complaint  Patient presents with  . Shortness of Breath  . Foot Pain        Assessment & Plan    1. Massive edema mostly in his legs lungs relatively spared. Patient claims to have 50 pound weight gain, has chronic systolic CHF with EF of 15%, however has more than 300 protein in his urine. I question whether he has nephrotic syndrome, renal ultrasound ordered, have ordered SPEP, UPEP, 24-hour urine protein and creatinine collection, protein creatinine ratio in the urine, discussed the case with nephrologist Dr. Caryn Section who will see the patient on Monday. Cardiology following the patient in controlling the diuretic dose, renal function has improved somewhat. We will monitor eyes and nose BNP and edema closely.     2. CAD with Chronic systolic heart failure with EF 15%. Cardiology following the patient, patient appears to be related he compensated at least from the left heart standpoint, his edema is mostly peripheral, likely due to proteinuria plus minus heart failure, pulmonary artery pressures are mildly elevated at 43 MM of mercury. Defer CHF management to cardiology at this point. He has been placed on fluid restriction, ACE/ARB  held due to acute renal insufficiency,  Continue Coreg,Renal function is improving. Monitor on present dose Lasix.     3. Hypertension in poor control patient on Coreg, will add hydralazine along with when necessary Catapres as needed.     4. Acute renal failure with massive proteinuria. Renal studies as dictated in #1 above, case discussed with Dr. Caryn Section nephrologist to see the patient on  Monday, renal ultrasound ordered along with SPEP, UPEP, 24-hour urine protein and creatinine collection and protein creatinine ratio in urine, will hold ACE/ARB for now. Serial BMPs.Renal function has improved for now.      5. Left foot gout, not much benefit from prednisone and colchicine in the past, cannot give him NSAIDs due to acute renal failure, will check a chest level and place him on colchicine, stable x-ray of left foot.      6. Right second and third toe, left First toe discoloration. ABI stable, vascular surgery consult Appreciated, echo free of any thrombus. Patient will likely need amputation of his discolored toes, most likely this is small vessel disease, We'll add low-dose aspirin already on statin,,We'll continue supportive care ,Discussed the case with Dr. Arbie Cookey on 12/05/2012, d-dimer was ordered by cardiology which came elevated, lower extremity ultrasound to rule out DVT was very limited due to massive edema, at this point in the light of a discoloration and elevated d-dimer we will continue him on IV heparin drip, once Toe amputation is done patient's edema has improved repeat ultrasound can be done to rule out any clots.      7.  DM type II. Agree with holding by mouth medications, continue sliding scale insulin.  Lab Results  Component Value Date   HGBA1C 7.5* 08/28/2012    CBG (last 3)   Basename 12/05/12 0604 12/04/12 2142 12/04/12 1641  GLUCAP 130* 144* 126*     8. Iron deficiency anemia chronic. No acute issues, continue her iron supplementation.     9. Hypothyroidism TSH only mildly elevated with free T3-T4, outpatient T3-T4 and TSH monitoring by PCP.     10. Mild leukocytosis- non-specific, could be due to recent steroid treatment, chest x-ray inconclusive, Stable 2 view chest x-ray ,Remains afebrile, monitor WBC and temperature trend.      Code Status: full  Family Communication: with the patient  Disposition Plan: Home   Procedures  ABI, renal ultrasound, echo gram, SPEP, UPEP, protein creatinine ratio in urine   Consults  cardiology Dr. Patty Sermons, discussed with nephrologist Dr. Caryn Section consulted on 12/04/2012 and suggested he will see the patient on Monday, vascular surgery to see the patient has been called on 12-2012   DVT Prophylaxis  Heparin  Lab Results  Component Value Date   PLT 414* 12/05/2012    Medications  Scheduled Meds:    . atorvastatin  10 mg Oral q1800  . carvedilol  3.125 mg Oral BID WC  . colchicine  0.6 mg Oral BID  . ferrous gluconate  324 mg Oral TID WC  . furosemide  40 mg Intravenous Daily  . hydrALAZINE  25 mg Oral Q8H  . insulin aspart  0-9 Units Subcutaneous TID WC  . pantoprazole  40 mg Oral BID  . sodium chloride  3 mL Intravenous Q12H   Continuous Infusions:    . DOPamine 2.5 mcg/kg/min (12/04/12 1447)  . heparin 1,600 Units/hr (12/05/12 1003)   PRN Meds:.sodium chloride, acetaminophen, cloNIDine, HYDROcodone-acetaminophen, HYDROmorphone (DILAUDID) injection, ondansetron (ZOFRAN) IV, sodium chloride  Antibiotics    Anti-infectives    None       Time Spent in minutes   45   Gwynne Kemnitz K M.D on 12/05/2012 at 11:18 AM  Between 7am to 7pm - Pager - [phone]  After 7pm go to [email or web address] - password [email or web address]  And look for the night coverage person covering for me after hours  [facility] Office  [phone]    Subjective:   Jose Petersen today has, No headache, No chest pain, No abdominal pain - No Nausea, No new weakness tingling or numbness, No Cough - SOB. Has right-sided second and third toe pain along with left foot pain from gout  Objective:   Filed Vitals:   12/04/12 2143 12/05/12 0059 12/05/12 0215 12/05/12 0549  BP: 154/94 146/85 155/95 165/98  Pulse: 74 78 74 79  Temp: 97.5 F (36.4 C)  98 F (36.7 C) 98.3 F (36.8 C)  TempSrc: Oral  Oral Oral  Resp: 20  20 20   Height:      Weight:    132.7 kg (292 lb 8.8 oz)  SpO2: 95%  96%  100%    Wt Readings from Last 3 Encounters:  12/05/12 132.7 kg (292 lb 8.8 oz)  08/30/12 110.7 kg (244 lb 0.8 oz)  08/30/12 110.7 kg (244 lb 0.8 oz)     Intake/Output Summary (Last 24 hours) at 12/05/12 1118 Last data filed at 12/05/12 2536  Gross per 24 hour  Intake    963 ml  Output   3125 ml  Net  -2162 ml    Exam Awake Alert, Oriented X 3, No  new F.N deficits, Normal affect [place].AT,PERRAL Supple Neck,No JVD, No cervical lymphadenopathy appriciated.  Symmetrical Chest wall movement, Good air movement bilaterally, CTAB RRR,No Gallops,Rubs or new Murmurs, No Parasternal Heave +ve B.Sounds, Abd Soft, Non tender, No organomegaly appriciated, No rebound - guarding or rigidity. No Cyanosis, 2-3+ edema in legs up to his waist, right second and third toes discolored and cyanotic , left big toe mildly cyanotic at the tip   Data Review   Micro Results No results found for this or any previous visit (from the past 240 hour(s)).  Radiology Reports Dg Chest 2 View  12/03/2012  *RADIOLOGY REPORT*  Clinical Data: 48 year old male with swelling, weight gain, weakness.  CHEST - 2 VIEW  Comparison: 08/29/2012 and earlier.  Findings: Sequelae of CABG.  Stable median sternotomy wires.  Lower lung volumes.  Patchy opacity at the lung bases, greater on the left.  There is a small left pleural effusion.  No overt pulmonary edema.  No pneumothorax. Stable cardiomegaly and mediastinal contours.  Visualized tracheal air column is within normal limits. No acute osseous abnormality identified.  IMPRESSION: New small left pleural effusion.  Patchy left basilar opacity, favor atelectasis but developing pneumonia is difficult to exclude. No acute pulmonary edema.   Original Report Authenticated By: Erskine Speed, M.D.    Dg Foot Complete Left  12/03/2012  **ADDENDUM** CREATED: 12/03/2012 14:52:22  Incidental Achilles spur.  **END ADDENDUM** SIGNED BY: Elsie Stain, M.D.   12/03/2012  *RADIOLOGY REPORT*  Clinical  Data: Swelling and pain in both feet.  History of gout.  LEFT FOOT - COMPLETE 3+ VIEW  Comparison: None.  Findings: There is no evidence of fracture or dislocation.  There is no evidence of arthropathy or other focal bony abnormality. There is diffuse soft tissue swelling without osseous erosion.  IMPRESSION: Soft tissue swelling.  No focal bony abnormality.   Original Report Authenticated By: Davonna Belling, M.D.    Dg Foot Complete Right  12/03/2012  *RADIOLOGY REPORT*  Clinical Data: Swelling and pain.  History of gout.  RIGHT FOOT COMPLETE - 3+ VIEW  Comparison: None.  Findings: Mild soft tissue swelling proximally and about the ankle. There is no evidence of fracture or dislocation.  There is no evidence of arthropathy or other focal bony abnormality.  No erosive lesion is identified. Plantar are and Achilles spurs are noted.  IMPRESSION: Mild soft tissue swelling.  Focal bony abnormality.   Original Report Authenticated By: Davonna Belling, M.D.     CBC  Lab 12/05/12 0129 12/03/12 2055 12/03/12 1253  WBC 12.7* 12.5* 8.8  HGB 13.5 12.2* 11.0*  HCT 44.4 40.3 37.6*  PLT 414* 440* 403*  MCV 74.2* 75.0* 74.2*  MCH 22.6* 22.7* 21.7*  MCHC 30.4 30.3 29.3*  RDW 17.6* 17.5* 17.5*  LYMPHSABS -- -- --  MONOABS -- -- --  EOSABS -- -- --  BASOSABS -- -- --  BANDABS -- -- --    Chemistries   Lab 12/05/12 0129 12/04/12 0837 12/04/12 0834 12/03/12 2055 12/03/12 1253  NA 135 -- 138 -- 139  K 3.6 -- 4.3 -- 3.7  CL 96 -- 101 -- 102  CO2 23 -- 21 -- 20  GLUCOSE 118* -- 140* -- 162*  BUN 23 -- 25* -- 21  CREATININE 1.35 -- 1.53* 1.44* 1.22  CALCIUM 9.5 -- 9.3 -- 9.2  MG 1.8 -- -- -- --  AST -- 20 -- -- --  ALT -- 12 -- -- --  ALKPHOS -- 96 -- -- --  BILITOT -- 0.8 -- -- --   ------------------------------------------------------------------------------------------------------------------ estimated creatinine clearance is 90.4 ml/min (by C-G formula based on Cr of  1.35). ------------------------------------------------------------------------------------------------------------------ No results found for this basename: HGBA1C:2 in the last 72 hours ------------------------------------------------------------------------------------------------------------------ No results found for this basename: CHOL:2,HDL:2,LDLCALC:2,TRIG:2,CHOLHDL:2,LDLDIRECT:2 in the last 72 hours ------------------------------------------------------------------------------------------------------------------  Basename 12/04/12 1300  TSH 6.498*  T4TOTAL --  T3FREE 3.3  THYROIDAB --   ------------------------------------------------------------------------------------------------------------------  Basename 12/04/12 1300  VITAMINB12 457  FOLATE 9.5  FERRITIN 25  TIBC 354  IRON 47  RETICCTPCT 1.1    Coagulation profile No results found for this basename: INR:5,PROTIME:5 in the last 168 hours   Basename 12/04/12 1300  DDIMER 2.73*    Cardiac Enzymes  Lab 12/04/12 0837 12/04/12 0253 12/03/12 2055  CKMB -- -- --  TROPONINI <0.30 <0.30 <0.30  MYOGLOBIN -- -- --   ------------------------------------------------------------------------------------------------------------------ No components found with this basename: POCBNP:3

## 2012-12-05 NOTE — Progress Notes (Signed)
Patient ID: Jose Petersen, male   DOB: [birth year], 47 y.o.   MRN: [id number] Subjective:  C/o gout pain in feet, left more than right. Dyspnea improved but still present.  Objective:  Vital Signs in the last 24 hours: Temp:  [97.4 F (36.3 C)-98.3 F (36.8 C)] 98.3 F (36.8 C) (01/04 0549) Pulse Rate:  [60-87] 79  (01/04 0549) Resp:  [20] 20  (01/04 0549) BP: (146-170)/(85-102) 165/98 mmHg (01/04 0549) SpO2:  [95 %-100 %] 100 % (01/04 0549) Weight:  [292 lb 8.8 oz (132.7 kg)] 292 lb 8.8 oz (132.7 kg) (01/04 0549)  Intake/Output from previous day: 01/03 0701 - 01/04 0700 In: 963 [P.O.:960; I.V.:3] Out: 3125 [Urine:3125] Intake/Output from this shift: Total I/O In: 240 [P.O.:240] Out: -   Physical Exam: chronically ill appearing middle aged man, NAD HEENT: Unremarkable Neck:  No JVD, no thyromegally Lungs:  Scattered basilar rales with rare wheezes HEART:  Regular rate rhythm, no murmurs, no rubs, no clicks Abd:  obese, positive bowel sounds, no organomegally, no rebound, no guarding Ext:  2 plus pulses, 2-3+ edema, toes with some cyanosis at the tips, no clubbing Skin:  No rashes no nodules Neuro:  CN II through XII intact, motor grossly intact  Lab Results:  [facility] 12/05/12 0129 12/03/12 2055  WBC 12.7* 12.5*  HGB 13.5 12.2*  PLT 414* 440*    Basename 12/05/12 0129 12/04/12 0834  NA 135 138  K 3.6 4.3  CL 96 101  CO2 23 21  GLUCOSE 118* 140*  BUN 23 25*  CREATININE 1.35 1.53*    Basename 12/04/12 0837 12/04/12 0253  TROPONINI <0.30 <0.30   Hepatic Function Panel  Basename 12/04/12 0837  PROT 6.5  ALBUMIN 3.4*  AST 20  ALT 12  ALKPHOS 96  BILITOT 0.8  BILIDIR 0.4*  IBILI 0.4   No results found for this basename: CHOL in the last 72 hours No results found for this basename: PROTIME in the last 72 hours  Imaging: Dg Chest 2 View  12/03/2012  *RADIOLOGY REPORT*  Clinical Data: 47 year old male with swelling, weight gain, weakness.  CHEST - 2 VIEW   Comparison: 08/29/2012 and earlier.  Findings: Sequelae of CABG.  Stable median sternotomy wires.  Lower lung volumes.  Patchy opacity at the lung bases, greater on the left.  There is a small left pleural effusion.  No overt pulmonary edema.  No pneumothorax. Stable cardiomegaly and mediastinal contours.  Visualized tracheal air column is within normal limits. No acute osseous abnormality identified.  IMPRESSION: New small left pleural effusion.  Patchy left basilar opacity, favor atelectasis but developing pneumonia is difficult to exclude. No acute pulmonary edema.   Original Report Authenticated By: Erskine Speed, M.D.    US Renal  12/04/2012  *RADIOLOGY REPORT*  Clinical Data: Acute renal failure  RENAL/URINARY TRACT ULTRASOUND  Technique: Renal ultrasound  Comparison:  None.  Findings: Right kidney measures 11.8 cm in length.  No diagnostic renal calculus.  Mild dilatation of the extrarenal pelvis without frank hydronephrosis.  Left kidney measures 11.7 cm in length.  No diagnostic renal calculus.  Mild dilatation of the left renal pelvis without frank hydronephrosis.  Visualized urinary bladder is unremarkable.  Abdominal ascites is noted.  Left pleural effusion.  IMPRESSION:  1.  No diagnostic renal calculus.  Mild dilatation of the renal pelves bilaterally without frank hydronephrosis. 2.  Abdominal ascites is noted.  Left pleural effusion.   Original Report Authenticated By: Natasha Mead, M.D.  Dg Foot Complete Left  12/03/2012  **ADDENDUM** CREATED: 12/03/2012 14:52:22  Incidental Achilles spur.  **END ADDENDUM** SIGNED BY: Elsie Stain, M.D.   12/03/2012  *RADIOLOGY REPORT*  Clinical Data: Swelling and pain in both feet.  History of gout.  LEFT FOOT - COMPLETE 3+ VIEW  Comparison: None.  Findings: There is no evidence of fracture or dislocation.  There is no evidence of arthropathy or other focal bony abnormality. There is diffuse soft tissue swelling without osseous erosion.  IMPRESSION: Soft tissue  swelling.  No focal bony abnormality.   Original Report Authenticated By: Davonna Belling, M.D.    Dg Foot Complete Right  12/03/2012  *RADIOLOGY REPORT*  Clinical Data: Swelling and pain.  History of gout.  RIGHT FOOT COMPLETE - 3+ VIEW  Comparison: None.  Findings: Mild soft tissue swelling proximally and about the ankle. There is no evidence of fracture or dislocation.  There is no evidence of arthropathy or other focal bony abnormality.  No erosive lesion is identified. Plantar are and Achilles spurs are noted.  IMPRESSION: Mild soft tissue swelling.  Focal bony abnormality.   Original Report Authenticated By: Davonna Belling, M.D.     Cardiac Studies: Tele - nsr Assessment/Plan:  1. Acute on chronic CHF 2. Acute on chronic gout 3, morbid obesity 4. DM 5. Blue toe Rec: continue diuresis and positive ionotropes and daily weights, Treat gout with colchicine. Consider narcotics for gout pain.  LOS: 2 days    Gregg Taylor,M.D. 12/05/2012, 8:48 AM

## 2012-12-05 NOTE — Progress Notes (Addendum)
*  Preliminary Results* Bilateral lower extremity venous duplex completed. Visualized veins of bilateral lower extremities are negative for deep vein thrombosis. Unable to visualize the distal right femoral vein, right peroneal, left mid and distal femoral vein, left posterior tibial and left peroneal veins; therefore unable to exclude deep vein thrombosis in these segments. Bilateral Baker's cysts were visualized.  Incidental finding: Right toes appear to be dusky, consistent with Dr.Fields' note. The tip of the left great toe has a small area that appears to be dusky near the nail bed. Observation and preliminary results shared with Raynelle Fanning, RN as well as Dr.Early.  12/05/2012 9:34 AM Gertie Fey, RDMS, RDCS

## 2012-12-06 LAB — GLUCOSE, CAPILLARY
Glucose-Capillary: 153 mg/dL — ABNORMAL HIGH (ref 70–99)
Glucose-Capillary: 128 mg/dL — ABNORMAL HIGH (ref 70–99)

## 2012-12-06 LAB — BASIC METABOLIC PANEL
BUN: 20 mg/dL (ref 6–23)
CO2: 23 mEq/L (ref 19–32)
Chloride: 97 mEq/L (ref 96–112)
Creatinine, Ser: 1.26 mg/dL (ref 0.50–1.35)
Glucose, Bld: 132 mg/dL — ABNORMAL HIGH (ref 70–99)

## 2012-12-06 LAB — CBC
HCT: 36.3 % — ABNORMAL LOW (ref 39.0–52.0)
MCHC: 31.1 g/dL (ref 30.0–36.0)
MCV: 74.1 fL — ABNORMAL LOW (ref 78.0–100.0)
RDW: 17.3 % — ABNORMAL HIGH (ref 11.5–15.5)

## 2012-12-06 MED ORDER — POTASSIUM CHLORIDE CRYS ER 20 MEQ PO TBCR
40.0000 meq | EXTENDED_RELEASE_TABLET | Freq: Two times a day (BID) | ORAL | Status: AC
  Administered 2012-12-06 (×2): 40 meq via ORAL
  Filled 2012-12-06 (×2): qty 2

## 2012-12-06 NOTE — Progress Notes (Signed)
Patient ID: Jose Petersen, male   DOB: [birth year], 47 y.o.   MRN: [id number] Subjective:  C/o bilateral foot pain. Dyspnea stable.  Objective:  Vital Signs in the last 24 hours: Temp:  [97.4 F (36.3 C)-97.9 F (36.6 C)] 97.8 F (36.6 C) (01/05 0537) Pulse Rate:  [72-88] 83  (01/05 0537) Resp:  [18] 18  (01/05 0537) BP: (117-148)/(64-95) 148/95 mmHg (01/05 0537) SpO2:  [95 %-100 %] 98 % (01/05 0537) Weight:  [293 lb (132.904 kg)] 293 lb (132.904 kg) (01/05 0537)  Intake/Output from previous day: 01/04 0701 - 01/05 0700 In: 2805.2 [P.O.:1800; I.V.:1005.2] Out: 1500 [Urine:1500] Intake/Output from this shift: Total I/O In: 120 [P.O.:120] Out: -   Physical Exam: Chronically ill appearing NAD HEENT: Unremarkable Neck:  Cannot assess JVD, no thyromegally Lungs:  Rales in bases but no wheezes HEART:  Regular rate rhythm, distant, no murmurs, no rubs, no clicks Abd:  obese, positive bowel sounds, no organomegally, no rebound, no guarding Ext:  2 plus pulses, 2+ edema, no cyanosis, no clubbing, right toes blue. Skin:  No rashes no nodules Neuro:  CN II through XII intact, motor grossly intact  Lab Results:  Basename 12/05/12 0129 12/03/12 2055  WBC 12.7* 12.5*  HGB 13.5 12.2*  PLT 414* 440*    Basename 12/05/12 0129 12/04/12 0834  NA 135 138  K 3.6 4.3  CL 96 101  CO2 23 21  GLUCOSE 118* 140*  BUN 23 25*  CREATININE 1.35 1.53*    Basename 12/04/12 0837 12/04/12 0253  TROPONINI <0.30 <0.30   Hepatic Function Panel  Basename 12/04/12 0837  PROT 6.5  ALBUMIN 3.4*  AST 20  ALT 12  ALKPHOS 96  BILITOT 0.8  BILIDIR 0.4*  IBILI 0.4   No results found for this basename: CHOL in the last 72 hours No results found for this basename: PROTIME in the last 72 hours  Imaging: Dg Chest 2 View  12/05/2012  *RADIOLOGY REPORT*  Clinical Data: Cough.  Peripheral edema.  Diabetes.  Gout. Coronary artery disease.  CHEST - 2 VIEW  Comparison: 12/03/2012  Findings: Low lung  volumes are again seen.  Small left pleural effusion noted with mild left basilar atelectasis or infiltrate. These findings are unchanged.  Right lung remains clear.  Heart size is normal.  Prior CABG again noted.  IMPRESSION: No significant change in small left pleural effusion and mild left basilar atelectasis versus infiltrate.   Original Report Authenticated By: Myles Rosenthal, M.D.    US Renal  12/04/2012  *RADIOLOGY REPORT*  Clinical Data: Acute renal failure  RENAL/URINARY TRACT ULTRASOUND  Technique: Renal ultrasound  Comparison:  None.  Findings: Right kidney measures 11.8 cm in length.  No diagnostic renal calculus.  Mild dilatation of the extrarenal pelvis without frank hydronephrosis.  Left kidney measures 11.7 cm in length.  No diagnostic renal calculus.  Mild dilatation of the left renal pelvis without frank hydronephrosis.  Visualized urinary bladder is unremarkable.  Abdominal ascites is noted.  Left pleural effusion.  IMPRESSION:  1.  No diagnostic renal calculus.  Mild dilatation of the renal pelves bilaterally without frank hydronephrosis. 2.  Abdominal ascites is noted.  Left pleural effusion.   Original Report Authenticated By: Natasha Mead, M.D.     Cardiac Studies: Tele - nsr Assessment/Plan:  1.acute systolic CHF with worsening LV dysfunction 2. Blue toe syndrome 3. H/o Gi bleeding 4. Elevated D dimer 5. Gout 6. Proteinuria Rec: The patient is very complicated. 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 He was started on heparin due to an elevated D-dimer. No evidence of pulmonary emboli. No obvious clot on venous doppler study. Will stop heparin. Would stop dopamine. Note 24 hour urine results. Blood pressure is stable, will stop dopamine.  LOS: 3 days    Lewayne Bunting 12/06/2012, 8:31 AM

## 2012-12-06 NOTE — Progress Notes (Signed)
Subjective: Interval History: none.. still with pain in his toes right greater than left  Objective: Vital signs in last 24 hours: Temp:  [97.4 F (36.3 C)-97.9 F (36.6 C)] 97.8 F (36.6 C) (01/05 0537) Pulse Rate:  [72-88] 83  (01/05 0537) Resp:  [18] 18  (01/05 0537) BP: (117-148)/(64-95) 148/95 mmHg (01/05 0537) SpO2:  [95 %-100 %] 98 % (01/05 0537) Weight:  [293 lb (132.904 kg)] 293 lb (132.904 kg) (01/05 0537)  Intake/Output from previous day: 01/04 0701 - 01/05 0700 In: 2805.2 [P.O.:1800; I.V.:1005.2] Out: 1500 [Urine:1500] Intake/Output this shift: Total I/O In: 120 [P.O.:120] Out: -   Marked pitting edema bilaterally feet up onto legs. Dusky toes on 3 and 4 on the right with no open wounds.  Lab Results:  [facility] 12/06/12 0808 12/05/12 0129  WBC 8.0 12.7*  HGB 11.3* 13.5  HCT 36.3* 44.4  PLT 387 414*   BMET  Basename 12/06/12 0808 12/05/12 0129  NA 133* 135  K 3.4* 3.6  CL 97 96  CO2 23 23  GLUCOSE 132* 118*  BUN 20 23  CREATININE 1.26 1.35  CALCIUM 8.7 9.5    Studies/Results: Dg Chest 2 View  12/05/2012  *RADIOLOGY REPORT*  Clinical Data: Cough.  Peripheral edema.  Diabetes.  Gout. Coronary artery disease.  CHEST - 2 VIEW  Comparison: 12/03/2012  Findings: Low lung volumes are again seen.  Small left pleural effusion noted with mild left basilar atelectasis or infiltrate. These findings are unchanged.  Right lung remains clear.  Heart size is normal.  Prior CABG again noted.  IMPRESSION: No significant change in small left pleural effusion and mild left basilar atelectasis versus infiltrate.   Original Report Authenticated By: Myles Rosenthal, M.D.    Dg Chest 2 View  12/03/2012  *RADIOLOGY REPORT*  Clinical Data: 47 year old male with swelling, weight gain, weakness.  CHEST - 2 VIEW  Comparison: 08/29/2012 and earlier.  Findings: Sequelae of CABG.  Stable median sternotomy wires.  Lower lung volumes.  Patchy opacity at the lung bases, greater on the left.   There is a small left pleural effusion.  No overt pulmonary edema.  No pneumothorax. Stable cardiomegaly and mediastinal contours.  Visualized tracheal air column is within normal limits. No acute osseous abnormality identified.  IMPRESSION: New small left pleural effusion.  Patchy left basilar opacity, favor atelectasis but developing pneumonia is difficult to exclude. No acute pulmonary edema.   Original Report Authenticated By: Erskine Speed, M.D.    US Renal  12/04/2012  *RADIOLOGY REPORT*  Clinical Data: Acute renal failure  RENAL/URINARY TRACT ULTRASOUND  Technique: Renal ultrasound  Comparison:  None.  Findings: Right kidney measures 11.8 cm in length.  No diagnostic renal calculus.  Mild dilatation of the extrarenal pelvis without frank hydronephrosis.  Left kidney measures 11.7 cm in length.  No diagnostic renal calculus.  Mild dilatation of the left renal pelvis without frank hydronephrosis.  Visualized urinary bladder is unremarkable.  Abdominal ascites is noted.  Left pleural effusion.  IMPRESSION:  1.  No diagnostic renal calculus.  Mild dilatation of the renal pelves bilaterally without frank hydronephrosis. 2.  Abdominal ascites is noted.  Left pleural effusion.   Original Report Authenticated By: Natasha Mead, M.D.    Dg Foot Complete Left  12/03/2012  **ADDENDUM** CREATED: 12/03/2012 14:52:22  Incidental Achilles spur.  **END ADDENDUM** SIGNED BY: Elsie Stain, M.D.   12/03/2012  *RADIOLOGY REPORT*  Clinical Data: Swelling and pain in both feet.  History of gout.  LEFT FOOT - COMPLETE 3+ VIEW  Comparison: None.  Findings: There is no evidence of fracture or dislocation.  There is no evidence of arthropathy or other focal bony abnormality. There is diffuse soft tissue swelling without osseous erosion.  IMPRESSION: Soft tissue swelling.  No focal bony abnormality.   Original Report Authenticated By: Davonna Belling, M.D.    Dg Foot Complete Right  12/03/2012  *RADIOLOGY REPORT*  Clinical Data: Swelling  and pain.  History of gout.  RIGHT FOOT COMPLETE - 3+ VIEW  Comparison: None.  Findings: Mild soft tissue swelling proximally and about the ankle. There is no evidence of fracture or dislocation.  There is no evidence of arthropathy or other focal bony abnormality.  No erosive lesion is identified. Plantar are and Achilles spurs are noted.  IMPRESSION: Mild soft tissue swelling.  Focal bony abnormality.   Original Report Authenticated By: Davonna Belling, M.D.    Anti-infectives: Anti-infectives    None      Assessment/Plan: s/p * No surgery found * Patient had a venous duplex yesterday which had difficult visualization of some segments of his right femoral vein and tibial vein. All visualized segments had no evidence of DVT or flow abnormality. I discussed this at length with the patient and also his medical doctor. I feel that it is acceptable to discontinue the heparin. I feel that the risk for this certainly outweighs the benefit. Did not have imaging studies with contrast for further visualization of this due to his renal dysfunction. I also explained that although I doubt embolization from aortic or proximal iliac source, this cannot be ruled out currently due to the inability to give IV contrast due to his renal function. He continues to have palpable dorsalis pedis pulses and no evidence of critical ischemia to his feet.   LOS: 3 days   Frances Ambrosino 12/06/2012, 9:38 AM

## 2012-12-06 NOTE — Progress Notes (Signed)
[facility]                                                                                Patient Demographics  Jose Petersen, is a 47 y.o. male  ZOX:[id number]  WJX:[id number]  DOB - 21-Sep-1966  Admit date - 12/03/2012  Admitting Physician Hollice Espy, MD  Outpatient Primary MD for the patient is Default, Provider, MD  LOS - 3   Chief Complaint  Patient presents with  . Shortness of Breath  . Foot Pain        Assessment & Plan    1. Massive edema mostly in his legs lungs relatively spared. Patient claims to have 50 pound weight gain, has chronic systolic CHF with EF of 15%, however has more than 300 protein in his urine. I question whether he has nephrotic syndrome, renal ultrasound ordered, have ordered SPEP, UPEP, 24-hour urine protein and creatinine collection, protein creatinine ratio in the urine, discussed the case with nephrologist Dr. Caryn Section who will see the patient on Monday. Cardiology following the patient in controlling the diuretic dose, renal function has improved somewhat. We will monitor eyes and nose BNP and edema closely.     2. CAD with Chronic systolic heart failure with EF 15%. Cardiology following the patient, patient appears to be related he compensated at least from the left heart standpoint, his edema is mostly peripheral, likely due to proteinuria plus minus heart failure, pulmonary artery pressures are mildly elevated at 43 MM of mercury. Defer CHF management to cardiology at this point. He has been placed on fluid restriction, ACE/ARB  held due to acute renal insufficiency,  Continue Coreg ,Renal function is improving. Monitor on present dose Lasix.     3. Hypertension - on Coreg, we have added hydralazine along with when necessary Catapres as needed.     4. Acute renal failure with massive proteinuria. Renal studies as dictated in #1 above, case discussed with Dr. Caryn Section nephrologist to see the patient on Monday, renal  ultrasound ordered along with SPEP, UPEP, 24-hour urine protein and creatinine collection and protein creatinine ratio in urine, will hold ACE/ARB for now. Serial BMPs. Renal function has improved for now.      5. Left foot gout, not much benefit from prednisone and colchicine in the past, cannot give him NSAIDs due to acute renal failure, will check a chest level and place him on colchicine, stable x-ray of left foot.Pain is improving.      6. Right second and third toe, left First toe discoloration (With question of toe necrosis in the right foot) -  ABI stable, vascular surgery consult Appreciated, echo free of any thrombus. Patient will likely need amputation of his discolored toes, most likely this is small vessel disease, We'll add low-dose aspirin already on statin,,We'll continue supportive care ,Discussed the case with Dr. Arbie Cookey on 12/05/2012,And 12/06/2012 bedside, for now no anticoagulation full dose, Mildly high d-dimer Which was ordered by cardiology is deemed nonspecific in the light of toe necrosis, lower estimate the ultrasound per Dr. Arbie Cookey most likely is negative and there are no acute lots, discussed this with Dr. Arbie Cookey along with cardiologist Dr. Ladona Ridgel we all  concurred that patient does not full dose anticoagulation at this time.     7. DM type II. Agree with holding by mouth medications, continue sliding scale insulin.  Lab Results  Component Value Date   HGBA1C 7.5* 08/28/2012    CBG (last 3)   Basename 12/06/12 0627 12/05/12 2054 12/05/12 1626  GLUCAP 124* 138* 132*     8. Iron deficiency anemia chronic. No acute issues, continue her iron supplementation.     9. Hypothyroidism TSH only mildly elevated with free T3-T4, outpatient T3-T4 and TSH monitoring by PCP.     10. Mild leukocytosis- non-specific, could be due to recent steroid treatment, Had Stable 2 view chest x-ray ,Remains afebrile, Now leukocytosis resolved.      Code Status:  full  Family Communication: with the patient  Disposition Plan: Home   Procedures ABI, renal ultrasound, echo gram, SPEP, UPEP, protein creatinine ratio in urine   Consults  cardiology Dr. Patty Sermons, discussed with nephrologist Dr. Caryn Section consulted on 12/04/2012 and suggested he will see the patient on Monday, vascular surgery.   DVT Prophylaxis  Heparin  Lab Results  Component Value Date   PLT 387 12/06/2012    Medications  Scheduled Meds:    . aspirin  81 mg Oral Daily  . atorvastatin  10 mg Oral q1800  . carvedilol  3.125 mg Oral BID WC  . colchicine  0.6 mg Oral BID  . ferrous gluconate  324 mg Oral TID WC  . furosemide  40 mg Intravenous Daily  . hydrALAZINE  25 mg Oral Q8H  . insulin aspart  0-9 Units Subcutaneous TID WC  . pantoprazole  40 mg Oral BID  . sodium chloride  3 mL Intravenous Q12H   Continuous Infusions:   PRN Meds:.sodium chloride, acetaminophen, cloNIDine, HYDROcodone-acetaminophen, HYDROmorphone (DILAUDID) injection, ondansetron (ZOFRAN) IV, sodium chloride  Antibiotics    Anti-infectives    None       Time Spent in minutes   45   SINGH,PRASHANT K M.D on 12/06/2012 at 10:16 AM  Between 7am to 7pm - Pager - [phone]  After 7pm go to [email or web address] - password [email or web address]  And look for the night coverage person covering for me after hours  [facility] Office  [phone]    Subjective:   Jose Petersen today has, No headache, No chest pain, No abdominal pain - No Nausea, No new weakness tingling or numbness, No Cough - SOB. Has right-sided second and third toe pain along with left foot pain from gout  Objective:   Filed Vitals:   12/05/12 1525 12/05/12 2056 12/06/12 0141 12/06/12 0537  BP: 136/64 117/75 147/79 148/95  Pulse:  81 88 83  Temp:  97.9 F (36.6 C) 97.7 F (36.5 C) 97.8 F (36.6 C)  TempSrc:  Oral Oral Oral  Resp:  18 18 18   Height:      Weight:    132.904 kg (293 lb)  SpO2:  100% 100% 98%    Wt  Readings from Last 3 Encounters:  12/06/12 132.904 kg (293 lb)  08/30/12 110.7 kg (244 lb 0.8 oz)  08/30/12 110.7 kg (244 lb 0.8 oz)     Intake/Output Summary (Last 24 hours) at 12/06/12 1016 Last data filed at 12/06/12 0815  Gross per 24 hour  Intake 2685.17 ml  Output   1500 ml  Net 1185.17 ml    Exam Awake Alert, Oriented X 3, No new F.N deficits, Normal affect Mayer.AT,PERRAL Supple Neck,No JVD, No cervical lymphadenopathy appriciated.  Symmetrical Chest wall movement, Good air movement bilaterally, CTAB RRR,No Gallops,Rubs or new Murmurs, No Parasternal Heave +ve B.Sounds, Abd Soft, Non tender, No organomegaly appriciated, No rebound - guarding or rigidity. No Cyanosis, 2-3+ edema in legs up to his waist, right second and third toes discolored and cyanotic , left big toe mildly cyanotic at the tip   Data Review   Micro Results No results found for this or any previous visit (from the past 240 hour(s)).  Radiology Reports Dg Chest 2 View  12/03/2012  *RADIOLOGY REPORT*  Clinical Data: 47 year old male with swelling, weight gain, weakness.  CHEST - 2 VIEW  Comparison: 08/29/2012 and earlier.  Findings: Sequelae of CABG.  Stable median sternotomy wires.  Lower lung volumes.  Patchy opacity at the lung bases, greater on the left.  There is a small left pleural effusion.  No overt pulmonary edema.  No pneumothorax. Stable cardiomegaly and mediastinal contours.  Visualized tracheal air column is within normal limits. No acute osseous abnormality identified.  IMPRESSION: New small left pleural effusion.  Patchy left basilar opacity, favor atelectasis but developing pneumonia is difficult to exclude. No acute pulmonary edema.   Original Report Authenticated By: Erskine Speed, M.D.    Dg Foot Complete Left  12/03/2012  **ADDENDUM** CREATED: 12/03/2012 14:52:22  Incidental Achilles spur.  **END ADDENDUM** SIGNED BY: Elsie Stain, M.D.   12/03/2012  *RADIOLOGY REPORT*  Clinical Data: Swelling  and pain in both feet.  History of gout.  LEFT FOOT - COMPLETE 3+ VIEW  Comparison: None.  Findings: There is no evidence of fracture or dislocation.  There is no evidence of arthropathy or other focal bony abnormality. There is diffuse soft tissue swelling without osseous erosion.  IMPRESSION: Soft tissue swelling.  No focal bony abnormality.   Original Report Authenticated By: Davonna Belling, M.D.    Dg Foot Complete Right  12/03/2012  *RADIOLOGY REPORT*  Clinical Data: Swelling and pain.  History of gout.  RIGHT FOOT COMPLETE - 3+ VIEW  Comparison: None.  Findings: Mild soft tissue swelling proximally and about the ankle. There is no evidence of fracture or dislocation.  There is no evidence of arthropathy or other focal bony abnormality.  No erosive lesion is identified. Plantar are and Achilles spurs are noted.  IMPRESSION: Mild soft tissue swelling.  Focal bony abnormality.   Original Report Authenticated By: Davonna Belling, M.D.     CBC  Lab 12/06/12 0808 12/05/12 0129 12/03/12 2055 12/03/12 1253  WBC 8.0 12.7* 12.5* 8.8  HGB 11.3* 13.5 12.2* 11.0*  HCT 36.3* 44.4 40.3 37.6*  PLT 387 414* 440* 403*  MCV 74.1* 74.2* 75.0* 74.2*  MCH 23.1* 22.6* 22.7* 21.7*  MCHC 31.1 30.4 30.3 29.3*  RDW 17.3* 17.6* 17.5* 17.5*  LYMPHSABS -- -- -- --  MONOABS -- -- -- --  EOSABS -- -- -- --  BASOSABS -- -- -- --  BANDABS -- -- -- --    Chemistries   Lab 12/06/12 0808 12/05/12 0129 12/04/12 0837 12/04/12 0834 12/03/12 2055 12/03/12 1253  NA 133* 135 -- 138 -- 139  K 3.4* 3.6 -- 4.3 -- 3.7  CL 97 96 -- 101 -- 102  CO2 23 23 -- 21 -- 20  GLUCOSE 132* 118* -- 140* -- 162*  BUN 20 23 -- 25* -- 21  CREATININE 1.26 1.35 -- 1.53* 1.44* 1.22  CALCIUM 8.7 9.5 -- 9.3 -- 9.2  MG -- 1.8 -- -- -- --  AST -- -- 20 -- -- --  ALT -- -- 12 -- -- --  ALKPHOS -- -- 96 -- -- --  BILITOT -- -- 0.8 -- -- --    ------------------------------------------------------------------------------------------------------------------ estimated creatinine clearance is 96.9 ml/min (by C-G formula based on Cr of 1.26). ------------------------------------------------------------------------------------------------------------------ No results found for this basename: HGBA1C:2 in the last 72 hours ------------------------------------------------------------------------------------------------------------------ No results found for this basename: CHOL:2,HDL:2,LDLCALC:2,TRIG:2,CHOLHDL:2,LDLDIRECT:2 in the last 72 hours ------------------------------------------------------------------------------------------------------------------  Basename 12/04/12 1300  TSH 6.498*  T4TOTAL --  T3FREE 3.3  THYROIDAB --   ------------------------------------------------------------------------------------------------------------------  Basename 12/04/12 1300  VITAMINB12 457  FOLATE 9.5  FERRITIN 25  TIBC 354  IRON 47  RETICCTPCT 1.1    Coagulation profile No results found for this basename: INR:5,PROTIME:5 in the last 168 hours   Basename 12/04/12 1300  DDIMER 2.73*    Cardiac Enzymes  Lab 12/04/12 0837 12/04/12 0253 12/03/12 2055  CKMB -- -- --  TROPONINI <0.30 <0.30 <0.30  MYOGLOBIN -- -- --   ------------------------------------------------------------------------------------------------------------------ No components found with this basename: POCBNP:3

## 2012-12-07 LAB — CBC
HCT: 38.6 % — ABNORMAL LOW (ref 39.0–52.0)
Hemoglobin: 11.7 g/dL — ABNORMAL LOW (ref 13.0–17.0)
RBC: 5.12 MIL/uL (ref 4.22–5.81)
WBC: 7.1 10*3/uL (ref 4.0–10.5)

## 2012-12-07 LAB — BASIC METABOLIC PANEL
Chloride: 96 mEq/L (ref 96–112)
GFR calc Af Amer: 65 mL/min — ABNORMAL LOW (ref >90)
Potassium: 3.9 mEq/L (ref 3.5–5.1)
Sodium: 135 mEq/L (ref 135–145)

## 2012-12-07 LAB — GLUCOSE, CAPILLARY: Glucose-Capillary: 116 mg/dL — ABNORMAL HIGH (ref 70–99)

## 2012-12-07 LAB — HEPARIN LEVEL (UNFRACTIONATED): Heparin Unfractionated: 0.11 IU/mL — ABNORMAL LOW (ref 0.30–0.70)

## 2012-12-07 MED ORDER — HYDROCODONE-ACETAMINOPHEN 10-325 MG PO TABS
1.0000 | ORAL_TABLET | ORAL | Status: DC | PRN
  Administered 2012-12-07: 2 via ORAL
  Administered 2012-12-07: 1 via ORAL
  Administered 2012-12-08 – 2012-12-11 (×14): 2 via ORAL
  Administered 2012-12-11: 1 via ORAL
  Administered 2012-12-12 – 2012-12-14 (×6): 2 via ORAL
  Administered 2012-12-14: 1 via ORAL
  Administered 2012-12-14 – 2012-12-15 (×3): 2 via ORAL
  Filled 2012-12-07 (×6): qty 2
  Filled 2012-12-07: qty 1
  Filled 2012-12-07 (×14): qty 2
  Filled 2012-12-07: qty 1
  Filled 2012-12-07 (×3): qty 2
  Filled 2012-12-07: qty 1
  Filled 2012-12-07 (×3): qty 2

## 2012-12-07 MED ORDER — HEPARIN SODIUM (PORCINE) 5000 UNIT/ML IJ SOLN
5000.0000 [IU] | Freq: Three times a day (TID) | INTRAMUSCULAR | Status: DC
  Administered 2012-12-07 – 2012-12-08 (×4): 5000 [IU] via SUBCUTANEOUS
  Filled 2012-12-07 (×7): qty 1

## 2012-12-07 MED ORDER — FUROSEMIDE 10 MG/ML IJ SOLN
80.0000 mg | Freq: Two times a day (BID) | INTRAMUSCULAR | Status: DC
  Administered 2012-12-07 – 2012-12-08 (×2): 80 mg via INTRAVENOUS
  Filled 2012-12-07 (×2): qty 8

## 2012-12-07 MED ORDER — ISOSORBIDE MONONITRATE ER 30 MG PO TB24
30.0000 mg | ORAL_TABLET | Freq: Every day | ORAL | Status: DC
  Administered 2012-12-07 – 2012-12-18 (×11): 30 mg via ORAL
  Filled 2012-12-07 (×12): qty 1

## 2012-12-07 NOTE — Progress Notes (Signed)
Patient ID: Jose Petersen, male   DOB: August 18, 1966, 47 y.o.   MRN: [id number] Subjective:  C/o bilateral foot pain. DOE; no chest pain  Objective:  Vital Signs in the last 24 hours: Temp:  [97.2 F (36.2 C)-97.8 F (36.6 C)] 97.4 F (36.3 C) (01/06 0537) Pulse Rate:  [73-86] 73  (01/06 0537) Resp:  [16-19] 18  (01/06 0537) BP: (101-134)/(54-85) 123/85 mmHg (01/06 0537) SpO2:  [95 %-100 %] 98 % (01/06 0537) Weight:  [290 lb 9.1 oz (131.8 kg)] 290 lb 9.1 oz (131.8 kg) (01/06 0537)  Intake/Output from previous day: 01/05 0701 - 01/06 0700 In: 583 [P.O.:480; I.V.:103] Out: 550 [Urine:550] Intake/Output from this shift: Total I/O In: 60 [P.O.:60] Out: 350 [Urine:350]  Physical Exam: WD, obese, NAD HEENT: Unremarkable Neck:  Cannot assess JVD Lungs:  Diminished BS bases HEART:  Regular rate rhythm, distant, no murmurs, no rubs, no clicks Abd:  obese, mildly distended, NT Ext:  2+ edema; right toes blue. Neuro:  CN II through XII intact, motor grossly intact  Lab Results:  Basename 12/07/12 0550 12/06/12 0808  WBC 7.1 8.0  HGB 11.7* 11.3*  PLT 383 387    Basename 12/07/12 0550 12/06/12 0808  NA 135 133*  K 3.9 3.4*  CL 96 97  CO2 25 23  GLUCOSE 146* 132*  BUN 20 20  CREATININE 1.45* 1.26    Basename 12/04/12 0837  TROPONINI <0.30   Hepatic Function Panel  Basename 12/04/12 0837  PROT 6.5  ALBUMIN 3.4*  AST 20  ALT 12  ALKPHOS 96  BILITOT 0.8  BILIDIR 0.4*  IBILI 0.4     Cardiac Studies: Tele - nsr, PVCs Assessment/Plan:  1.acute systolic CHF with worsening LV dysfunction/CM 2. Blue toe syndrome 3. H/o Gi bleeding 4. Elevated D dimer 5. Gout 6. Proteinuria Etiology of cardiomyopathy unclear. He does have a history of coronary artery bypassing graft. Cardiac catheterization several years ago with patent grafts per patient. No recent chest pain. I would avoid cardiac catheterization at this point given severity of CHF and also question of peripheral  embolic syndrome. He did have a viral syndrome approximately one week prior to his CHF symptoms. 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 He is markedly volume overloaded. Change Lasix to 80 mg IV twice a day. Continue low-dose Coreg. Continue hydralazine and add nitrates. Titrate as tolerated by blood pressure. ACE inhibitors are being avoided for now given renal insufficiency. This will need to be reconsidered as renal function stabilizes. Vascular surgery is evaluating patient for his ischemic toes. Question whether he may have peripheral embolic syndrome. Note he also has renal insufficiency. Avoid CTA of his aorta given renal insufficiency and risk of contrast nephropathy. Also with near nephrotic range proteinuria. Nephrology to see. Management of gout per primary care. Patient will need CHF consult.  LOS: 4 days    Ilan Kahrs 12/07/2012, 8:00 AM

## 2012-12-07 NOTE — Consult Note (Signed)
Referring Provider: No ref. provider found Primary Care Physician:  Default, Provider, MD Primary Nephrologist:  none  Reason for Consultation:  Nephrotic and anasarca HPI: Jose Petersen is a 47 y/o M with history of obesity, CAD/MI s/p CABG in [place] 2007 (repeat cath 2009 pre-op knee that was clean per pt), HTN, DM and admission 08/2012 for profound anemia/GIB secondary to bleeding duodenal ulcer.    EF 15%, RV dysfunction (with prior EF in 08/2012 of 45-50% and normal RV)    Past Medical History  Diagnosis Date  . Coronary artery disease     a. 4V CABG 2007 in [place], possible mild MI. b. Repeat cath ~2009-2010 pre-op knee surgery that was reportedly "clean" per pt.  . Hypertension   . Gout   . Diverticulitis   . Hyperlipidemia   . GI bleed     a. Anemia/GIB with bleeding duodenal ulcer 08/2012.  Marland Kitchen Myocardial infarction 2007    "small one before OHS" (12/03/2012)  . Type II diabetes mellitus 2013  . Anxiety     "over the last couple weeks" (12/03/2012)  . Kidney stones ~ 1989; 10/2012  . Iron deficiency anemia     a. GIB 08/2012.  . Cardiomyopathy     a. EF 45-50% 08/2012.    Past Surgical History  Procedure Date  . Coronary artery bypass graft 2007    CABG X 4  . Knee cartilage surgery 2009    "left" (12/03/2012)  . Esophagogastroduodenoscopy 08/28/2012    Procedure: ESOPHAGOGASTRODUODENOSCOPY (EGD);  Surgeon: Willis Modena, MD;  Location: [facility];  Service: Endoscopy;  Laterality: N/A;  . Tonsillectomy 1960's  . Knee arthroscopy 1990's-2000's    "3 on the left; all arthoscopies for meninscus tear" (12/03/2012)  . Cardiac catheterization ~ 2009  . Cystoscopy/retrograde/ureteroscopy/stone extraction with basket ?1989  . Cystoscopy with retrograde pyelogram, ureteroscopy and stent placement 10/2012    Prior to Admission medications   Medication Sig Start Date End Date Taking? Authorizing Provider  carvedilol (COREG) 6.25 MG tablet Take 1 tablet (6.25 mg total) by mouth 2 (two)  times daily with a meal. 08/31/12  Yes Vassie Loll, MD  colchicine 0.6 MG tablet Take 1 tablet (0.6 mg total) by mouth 2 (two) times daily. 07/18/12 07/18/13 Yes Hannah Muthersbaugh, PA-C  ferrous gluconate (FERGON) 216 MG tablet Take 1 tablet (216 mg total) by mouth 3 (three) times daily with meals. 08/31/12  Yes Vassie Loll, MD  glucose blood test strip Use as instructed 08/31/12  Yes Vassie Loll, MD  glucose monitoring kit (FREESTYLE) monitoring kit 1 each by Does not apply route as needed for other. 08/31/12  Yes Vassie Loll, MD  Lancets (FREESTYLE) lancets Use as instructed 08/31/12  Yes Vassie Loll, MD  lisinopril (PRINIVIL,ZESTRIL) 20 MG tablet Take 20 mg by mouth daily.   Yes Historical Provider, MD  metFORMIN (GLUCOPHAGE) 500 MG tablet Take 1 tablet (500 mg total) by mouth 2 (two) times daily with a meal. 08/31/12  Yes Vassie Loll, MD  rosuvastatin (CRESTOR) 10 MG tablet Take 10 mg by mouth daily.   Yes Historical Provider, MD    Current Facility-Administered Medications  Medication Dose Route Frequency Provider Last Rate Last Dose  . 0.9 %  sodium chloride infusion  250 mL Intravenous PRN Renae Fickle, MD      . acetaminophen (TYLENOL) tablet 650 mg  650 mg Oral Q4H PRN Renae Fickle, MD      . aspirin chewable tablet 81 mg  81 mg Oral Daily Prashant  Curlene Labrum, MD   81 mg at 12/07/12 0931  . atorvastatin (LIPITOR) tablet 10 mg  10 mg Oral q1800 Renae Fickle, MD   10 mg at 12/06/12 1743  . carvedilol (COREG) tablet 3.125 mg  3.125 mg Oral BID WC Renae Fickle, MD   3.125 mg at 12/07/12 0700  . cloNIDine (CATAPRES) tablet 0.1 mg  0.1 mg Oral Q6H PRN Leroy Sea, MD   0.1 mg at 12/05/12 0308  . colchicine tablet 0.6 mg  0.6 mg Oral BID Leroy Sea, MD   0.6 mg at 12/07/12 0902  . ferrous gluconate (FERGON) tablet 324 mg  324 mg Oral TID WC Renae Fickle, MD   324 mg at 12/07/12 1213  . furosemide (LASIX) injection 80 mg  80 mg Intravenous BID Lewayne Bunting, MD       . heparin injection 5,000 Units  5,000 Units Subcutaneous Q8H Lewayne Bunting, MD   5,000 Units at 12/07/12 1423  . hydrALAZINE (APRESOLINE) tablet 25 mg  25 mg Oral Q8H Leroy Sea, MD   25 mg at 12/07/12 1423  . HYDROcodone-acetaminophen (NORCO) 10-325 MG per tablet 1-2 tablet  1-2 tablet Oral Q4H PRN Leroy Sea, MD   2 tablet at 12/07/12 1433  . HYDROmorphone (DILAUDID) injection 1 mg  1 mg Intravenous Q3H PRN Leroy Sea, MD   1 mg at 12/07/12 1247  . insulin aspart (novoLOG) injection 0-9 Units  0-9 Units Subcutaneous TID WC Renae Fickle, MD   2 Units at 12/07/12 1213  . isosorbide mononitrate (IMDUR) 24 hr tablet 30 mg  30 mg Oral Daily Lewayne Bunting, MD   30 mg at 12/07/12 0932  . ondansetron (ZOFRAN) injection 4 mg  4 mg Intravenous Q6H PRN Renae Fickle, MD   4 mg at 12/03/12 2156  . pantoprazole (PROTONIX) EC tablet 40 mg  40 mg Oral BID Dayna N Dunn, PA   40 mg at 12/07/12 0932  . sodium chloride 0.9 % injection 3 mL  3 mL Intravenous Q12H Renae Fickle, MD   3 mL at 12/07/12 0903  . sodium chloride 0.9 % injection 3 mL  3 mL Intravenous PRN Renae Fickle, MD        Allergies as of 12/03/2012 - Review Complete 12/03/2012  Allergen Reaction Noted  . Toradol (ketorolac tromethamine) Hives 12/03/2012  . Motrin (ibuprofen) Other (See Comments) 12/03/2012    Family History  Problem Relation Age of Onset  . COPD Father   . Heart disease Father   . High blood pressure Sister     History   Social History  . Marital Status: Divorced    Spouse Name: N/A    Number of Children: N/A  . Years of Education: N/A   Occupational History  . Not on file.   Social History Main Topics  . Smoking status: Former Smoker -- 1.0 packs/day for 24 years    Types: Cigarettes    Quit date: 12/03/2003  . Smokeless tobacco: Never Used  . Alcohol Use: Yes     Comment: 1/2/2014occasional social, 1 drink per year "if that"  . Drug Use: No  . Sexually Active: Not  Currently   Other Topics Concern  . Not on file   Social History Narrative   Lives in a house without stairs.  Lives with his mom to be with her when his father got sick to help with the house.  He does not use a cane or walker  and drives.  He previously worked out a [employer] about 6 days per week until a month ago.   Has insurance.  His primary care office is [facility].  Dr. Jens Som is Cardiologist.      Review of Systems: Gen: fatigue and weakness HEENT: No visual complaints, No history of Retinopathy. Normal external appearance No Epistaxis or Sore throat. No sinusitis.   CV: Denies chest pain, angina, palpitations, syncope, orthopnea, PND,  Edema with 50 lbs  EF 15% Resp: Shortness of breath on exertion GI: Denies vomiting blood, jaundice, and fecal incontinence.   Denies dysphagia or odynophagia. GU : Denies urinary burning, blood in urine, urinary frequency, urinary hesitancy, nocturnal urination, and urinary incontinence.  No renal calculi. MS: Denies joint pain, limitation of movement, and swelling, stiffness, low back pain, extremity pain. Denies muscle weakness, cramps, atrophy.  No use of non steroidal antiinflammatory drugs. Derm: ischemic lower extremities itching  Psych: Denies depression, anxiety, memory loss, suicidal ideation, hallucinations, paranoia, and confusion. Heme: Denies bruising, bleeding, and enlarged lymph nodes. Neuro: No headache.  No diplopia. No dysarthria.  No dysphasia.  No history of CVA.  No Seizures. No paresthesias.  No weakness. Endocrine positive DM.  No Thyroid disease.  No Adrenal disease.  Physical Exam: Vital signs in last 24 hours: Temp:  [97.4 F (36.3 C)-98.1 F (36.7 C)] 98.1 F (36.7 C) (01/06 1350) Pulse Rate:  [73-82] 76  (01/06 1350) Resp:  [16-18] 18  (01/06 1350) BP: (101-134)/(54-85) 134/83 mmHg (01/06 1350) SpO2:  [98 %-100 %] 100 % (01/06 1350) Weight:  [131.8 kg (290 lb 9.1 oz)] 131.8 kg (290 lb 9.1 oz) (01/06  0537) Last BM Date: 12/03/12 General:   Alert,  Well-developed, well-nourished, pleasant and cooperative in NAD Head:  Normocephalic and atraumatic. Eyes:  Sclera clear, no icterus.   Conjunctiva pink. Ears:  Normal auditory acuity. Nose:  No deformity, discharge,  or lesions. Mouth:  No deformity or lesions, dentition normal. Neck:  Supple; no masses or thyromegaly. JVP not elevated Lungs:  Wheezing no rales diminished breath sounds. Heart:  Regular rate and rhythm; no murmurs, clicks, rubs,  or gallops. Abdomen:  Soft, nontender and nondistended. No masses, hepatosplenomegaly or hernias noted. Normal bowel sounds, without guarding, and without rebound.   Msk:  Symmetrical without gross deformities. Normal posture. Pulses:  No carotid, renal, femoral bruits. DP and PT symmetrical and equal Extremities:  Without clubbing  3+ non pitting Neurologic:  Alert and  oriented x4;  grossly normal neurologically. Skin:  Intact without significant lesions or rashes. Cervical Nodes:  No significant cervical adenopathy. Psych:  Alert and cooperative. Normal mood and affect.  Intake/Output from previous day: 01/05 0701 - 01/06 0700 In: 583 [P.O.:480; I.V.:103] Out: 550 [Urine:550] Intake/Output this shift: Total I/O In: 300 [P.O.:300] Out: 350 [Urine:350]  Lab Results:  [facility] 12/07/12 0550 12/06/12 0808 12/05/12 0129  WBC 7.1 8.0 12.7*  HGB 11.7* 11.3* 13.5  HCT 38.6* 36.3* 44.4  PLT 383 387 414*   BMET  Basename 12/07/12 0550 12/06/12 0808 12/05/12 0129  NA 135 133* 135  K 3.9 3.4* 3.6  CL 96 97 96  CO2 25 23 23   GLUCOSE 146* 132* 118*  BUN 20 20 23   CREATININE 1.45* 1.26 1.35  CALCIUM 9.1 8.7 9.5  PHOS -- -- --   LFT No results found for this basename: PROT,ALBUMIN,AST,ALT,ALKPHOS,BILITOT,BILIDIR,IBILI in the last 72 hours PT/INR No results found for this basename: LABPROT:2,INR:2 in the last 72  hours Hepatitis Panel No results found for this basename:  HEPBSAG,HCVAB,HEPAIGM,HEPBIGM in the last 72 hours  Studies/Results: No results found.  Assessment/Plan:  Nephrotic syndrome with anasarca secondary to diabetic.  Anemia  No ESA  HTN/ VOL  Continue to diurese  Will need out outpatient follow up   LOS: 4 Assia Meanor W @TODAY @3 :49 PM

## 2012-12-07 NOTE — Progress Notes (Signed)
Utilization Review Completed.   Gwendlyon Zumbro, RN, BSN Nurse Case Manager  [phone]  

## 2012-12-07 NOTE — Progress Notes (Signed)
[facility]                                                                                Patient Demographics  Jose Petersen, is a 47 y.o. male  ZOX:[id number]  [id number]  DOB - [birth year]  Admit date - 12/03/2012  Admitting Physician Hollice Espy, MD  Outpatient Primary MD for the patient is Default, Provider, MD  LOS - 4   Chief Complaint  Patient presents with  . Shortness of Breath  . Foot Pain        Assessment & Plan   47 year old Caucasian male admitted to the hospital with shortness of breath, massive leg edema and pain in his toes and feet, he has history of chronic systolic heart failure along with gout, however he presented with multiple cyanotic toes which have been going on for the last 4-6 weeks, upon admission his workup was consistent with worsening of his EF from 45% to 15% for which cardiology is following, cyanotic toes every 2 likely small vessel disease per vascular surgery was following the patient, gout in the left foot, along with massive proteinuria suspicious for nephrotic syndrome in a diabetic patient, renal to see the patient on 12/07/2038, patient will likely need amputation of his right second and third toe by vascular surgery, left heart catheterization if his creatinine permits.    1. Massive edema mostly in his legs lungs relatively spared. Patient claims to have 50 pound weight gain, has chronic systolic CHF with EF of 15% has recently worsened, however has more than 300 protein in his urine on UA, and a half grams of protein in 24 hours in urine collection,. I question whether he has nephrotic syndrome, renal ultrasound ordered, have ordered SPEP, UPEP, 24-hour urine protein and creatinine collection, protein creatinine ratio in the urine, discussed the case with nephrologist Dr. Caryn Section on 12/04/2012, who suggested to reconsult on 12/07/2012 IV consulted Dr. Hyman Hopes and he will see the patient shortly. Cardiology following the  patient in controlling the diuretic dose, renal function remains questionable. We will monitor eyes daily BMP and edema closely.     2. CAD status post CABG in the past with Chronic systolic heart failure with EF 15% which has worsened from 45% recently. Cardiology following the patient, patient appears to be related he compensated at least from the left heart standpoint, his edema is mostly peripheral, likely due to proteinuria plus minus heart failure, pulmonary artery pressures are mildly elevated at 43 MM of mercury. Defer CHF management to cardiology at this point. He has been placed on fluid restriction, ACE/ARB  held due to acute renal insufficiency,  Continue Coreg . Monitor on present dose Lasix.     3. Hypertension - on Coreg, we have added hydralazine along with when necessary Catapres as needed.     4. Non-oliguric Acute renal failure with massive proteinuria. Renal studies as dictated in #1 above, renal to see the patient, renal ultrasound ordered along with SPEP, UPEP, 24-hour urine protein is around 2.5 g, suspicious for nephrotic syndrome in a diabetic patient, since creatinine is around 1.5, will hold ACE/ARB for now. Serial BMPs.      5.  Left foot gout, not much benefit from prednisone and colchicine in the past, cannot give him NSAIDs due to acute renal failure, will check a chest level and place him on colchicine, stable x-ray of left foot.Pain is improving.      6. Right second and third toe, left First toe discoloration (With question of toe necrosis in the right foot) -  ABI stable, vascular surgery consult Appreciated, transthoracic echo free of any thrombus. Per vascular surgery changes most likely due to small vessel disease, We'll add low-dose aspirin already on statin,,We'll continue supportive care ,Discussed the case with Dr. Arbie Cookey on 12/05/2012,And 12/06/2012 bedside, for now no full dose anticoagulation, Mildly high d-dimer Which was ordered by cardiology is  deemed nonspecific in the light of toe necrosis, lower extremity ultrasound per Dr. Arbie Cookey most likely is negative and there are no acute lots, discussed this with Dr. Arbie Cookey along with cardiologist Dr. Ladona Ridgel we all concurred that patient does not full dose anticoagulation at this time. Vascular surgery following the patient closely will likely need amputation of his right second and third toes.     7. DM type II. Agree with holding by mouth medications, continue sliding scale insulin.  Lab Results  Component Value Date   HGBA1C 7.5* 08/28/2012    CBG (last 3)   Basename 12/07/12 0641 12/06/12 2212 12/06/12 1641  GLUCAP 150* 137* 128*     8. Iron deficiency anemia chronic. No acute issues, continue her iron supplementation.     9. Hypothyroidism TSH only mildly elevated with free T3-T4, outpatient T3-T4 and TSH monitoring by PCP.     10. Mild leukocytosis- non-specific, could be due to recent steroid treatment, Had Stable 2 view chest x-ray ,Remains afebrile, Now leukocytosis resolved.      Code Status: full  Family Communication: with the patient  Disposition Plan: Home   Procedures ABI, renal ultrasound, echo gram, SPEP, UPEP, protein creatinine ratio in urine   Consults  cardiology Dr. Patty Sermons and Jens Som, discussed with nephrologist Dr. Caryn Section consulted on 12/04/2012 and suggested he will see the patient on Monday- discussed with Dr. Hyman Hopes nephrologist on 12/07/2012 he will see the patient today, vascular surgery following patient.   DVT Prophylaxis  Heparin  Lab Results  Component Value Date   PLT 383 12/07/2012    Medications  Scheduled Meds:    . aspirin  81 mg Oral Daily  . atorvastatin  10 mg Oral q1800  . carvedilol  3.125 mg Oral BID WC  . colchicine  0.6 mg Oral BID  . ferrous gluconate  324 mg Oral TID WC  . furosemide  80 mg Intravenous BID  . heparin subcutaneous  5,000 Units Subcutaneous Q8H  . hydrALAZINE  25 mg Oral Q8H  . insulin  aspart  0-9 Units Subcutaneous TID WC  . isosorbide mononitrate  30 mg Oral Daily  . pantoprazole  40 mg Oral BID  . sodium chloride  3 mL Intravenous Q12H   Continuous Infusions:   PRN Meds:.sodium chloride, acetaminophen, cloNIDine, HYDROcodone-acetaminophen, HYDROmorphone (DILAUDID) injection, ondansetron (ZOFRAN) IV, sodium chloride  Antibiotics    Anti-infectives    None       Time Spent in minutes   45   Gisella Alwine K M.D on 12/07/2012 at 11:00 AM  Between 7am to 7pm - Pager - [phone]  After 7pm go to [email or web address] - password [email or web address]  And look for the night coverage person covering for me after hours  [facility] Office  [phone]  Subjective:   Jose Petersen today has, No headache, No chest pain, No abdominal pain - No Nausea, No new weakness tingling or numbness, No Cough - SOB. Has right-sided second and third toe pain along with left foot pain from gout  Objective:   Filed Vitals:   12/06/12 1347 12/06/12 1742 12/06/12 2200 12/07/12 0537  BP: 125/79 134/54 101/64 123/85  Pulse: 75 82 73 73  Temp: 97.2 F (36.2 C)  97.8 F (36.6 C) 97.4 F (36.3 C)  TempSrc: Oral  Oral Oral  Resp: 19  16 18   Height:      Weight:    131.8 kg (290 lb 9.1 oz)  SpO2: 95%  100% 98%    Wt Readings from Last 3 Encounters:  12/07/12 131.8 kg (290 lb 9.1 oz)  08/30/12 110.7 kg (244 lb 0.8 oz)  08/30/12 110.7 kg (244 lb 0.8 oz)     Intake/Output Summary (Last 24 hours) at 12/07/12 1100 Last data filed at 12/07/12 0853  Gross per 24 hour  Intake 522.98 ml  Output    900 ml  Net -377.02 ml    Exam Awake Alert, Oriented X 3, No new F.N deficits, Normal affect [place].AT,PERRAL Supple Neck,No JVD, No cervical lymphadenopathy appriciated.  Symmetrical Chest wall movement, Good air movement bilaterally, CTAB RRR,No Gallops,Rubs or new Murmurs, No Parasternal Heave +ve B.Sounds, Abd Soft, Non tender, No organomegaly appriciated, No rebound - guarding  or rigidity. No Cyanosis, 2-3+ edema in legs up to his waist, right second and third toes discolored and cyanotic , left big toe mildly cyanotic at the tip   Data Review   Micro Results No results found for this or any previous visit (from the past 240 hour(s)).  Radiology Reports Dg Chest 2 View  12/03/2012  *RADIOLOGY REPORT*  Clinical Data: 47 year old male with swelling, weight gain, weakness.  CHEST - 2 VIEW  Comparison: 08/29/2012 and earlier.  Findings: Sequelae of CABG.  Stable median sternotomy wires.  Lower lung volumes.  Patchy opacity at the lung bases, greater on the left.  There is a small left pleural effusion.  No overt pulmonary edema.  No pneumothorax. Stable cardiomegaly and mediastinal contours.  Visualized tracheal air column is within normal limits. No acute osseous abnormality identified.  IMPRESSION: New small left pleural effusion.  Patchy left basilar opacity, favor atelectasis but developing pneumonia is difficult to exclude. No acute pulmonary edema.   Original Report Authenticated By: Erskine Speed, M.D.    Dg Foot Complete Left  12/03/2012  **ADDENDUM** CREATED: 12/03/2012 14:52:22  Incidental Achilles spur.  **END ADDENDUM** SIGNED BY: Elsie Stain, M.D.   12/03/2012  *RADIOLOGY REPORT*  Clinical Data: Swelling and pain in both feet.  History of gout.  LEFT FOOT - COMPLETE 3+ VIEW  Comparison: None.  Findings: There is no evidence of fracture or dislocation.  There is no evidence of arthropathy or other focal bony abnormality. There is diffuse soft tissue swelling without osseous erosion.  IMPRESSION: Soft tissue swelling.  No focal bony abnormality.   Original Report Authenticated By: Davonna Belling, M.D.    Dg Foot Complete Right  12/03/2012  *RADIOLOGY REPORT*  Clinical Data: Swelling and pain.  History of gout.  RIGHT FOOT COMPLETE - 3+ VIEW  Comparison: None.  Findings: Mild soft tissue swelling proximally and about the ankle. There is no evidence of fracture or  dislocation.  There is no evidence of arthropathy or other focal bony abnormality.  No erosive lesion is identified.  Plantar are and Achilles spurs are noted.  IMPRESSION: Mild soft tissue swelling.  Focal bony abnormality.   Original Report Authenticated By: Davonna Belling, M.D.     CBC  Lab 12/07/12 0550 12/06/12 0808 12/05/12 0129 12/03/12 2055 12/03/12 1253  WBC 7.1 8.0 12.7* 12.5* 8.8  HGB 11.7* 11.3* 13.5 12.2* 11.0*  HCT 38.6* 36.3* 44.4 40.3 37.6*  PLT 383 387 414* 440* 403*  MCV 75.4* 74.1* 74.2* 75.0* 74.2*  MCH 22.9* 23.1* 22.6* 22.7* 21.7*  MCHC 30.3 31.1 30.4 30.3 29.3*  RDW 17.5* 17.3* 17.6* 17.5* 17.5*  LYMPHSABS -- -- -- -- --  MONOABS -- -- -- -- --  EOSABS -- -- -- -- --  BASOSABS -- -- -- -- --  BANDABS -- -- -- -- --    Chemistries   Lab 12/07/12 0550 12/06/12 0808 12/05/12 0129 12/04/12 0837 12/04/12 0834 12/03/12 2055 12/03/12 1253  NA 135 133* 135 -- 138 -- 139  K 3.9 3.4* 3.6 -- 4.3 -- 3.7  CL 96 97 96 -- 101 -- 102  CO2 25 23 23  -- 21 -- 20  GLUCOSE 146* 132* 118* -- 140* -- 162*  BUN 20 20 23  -- 25* -- 21  CREATININE 1.45* 1.26 1.35 -- 1.53* 1.44* --  CALCIUM 9.1 8.7 9.5 -- 9.3 -- 9.2  MG -- -- 1.8 -- -- -- --  AST -- -- -- 20 -- -- --  ALT -- -- -- 12 -- -- --  ALKPHOS -- -- -- 96 -- -- --  BILITOT -- -- -- 0.8 -- -- --   ------------------------------------------------------------------------------------------------------------------ estimated creatinine clearance is 83.8 ml/min (by C-G formula based on Cr of 1.45). ------------------------------------------------------------------------------------------------------------------ No results found for this basename: HGBA1C:2 in the last 72 hours ------------------------------------------------------------------------------------------------------------------ No results found for this basename: CHOL:2,HDL:2,LDLCALC:2,TRIG:2,CHOLHDL:2,LDLDIRECT:2 in the last 72  hours ------------------------------------------------------------------------------------------------------------------  Basename 12/04/12 1300  TSH 6.498*  T4TOTAL --  T3FREE 3.3  THYROIDAB --   ------------------------------------------------------------------------------------------------------------------  Basename 12/04/12 1300  VITAMINB12 457  FOLATE 9.5  FERRITIN 25  TIBC 354  IRON 47  RETICCTPCT 1.1    Coagulation profile No results found for this basename: INR:5,PROTIME:5 in the last 168 hours   Basename 12/04/12 1300  DDIMER 2.73*    Cardiac Enzymes  Lab 12/04/12 0837 12/04/12 0253 12/03/12 2055  CKMB -- -- --  TROPONINI <0.30 <0.30 <0.30  MYOGLOBIN -- -- --   ------------------------------------------------------------------------------------------------------------------ No components found with this basename: POCBNP:3

## 2012-12-08 ENCOUNTER — Encounter (HOSPITAL_COMMUNITY)
Admission: EM | Disposition: A | Discharge status: Home / Self Care | Payer: Self-pay | Source: Home / Self Care | Attending: Internal Medicine

## 2012-12-08 DIAGNOSIS — I509 Heart failure, unspecified: Secondary | ICD-10-CM

## 2012-12-08 HISTORY — PX: RIGHT HEART CATHETERIZATION: SHX5447

## 2012-12-08 LAB — CBC
HCT: 37.8 % — ABNORMAL LOW (ref 39.0–52.0)
Hemoglobin: 11.7 g/dL — ABNORMAL LOW (ref 13.0–17.0)
MCHC: 31 g/dL (ref 30.0–36.0)
RBC: 5.05 MIL/uL (ref 4.22–5.81)

## 2012-12-08 LAB — POCT I-STAT 3, VENOUS BLOOD GAS (G3P V)
Acid-Base Excess: 1 mmol/L (ref 0.0–2.0)
Acid-base deficit: 4 mmol/L — ABNORMAL HIGH (ref 0.0–2.0)
O2 Saturation: 38 %
pCO2, Ven: 48.9 mmHg (ref 45.0–50.0)
pH, Ven: 7.297 (ref 7.250–7.300)
pO2, Ven: 31 mmHg (ref 30.0–45.0)

## 2012-12-08 LAB — BASIC METABOLIC PANEL
BUN: 19 mg/dL (ref 6–23)
Chloride: 94 mEq/L — ABNORMAL LOW (ref 96–112)
GFR calc Af Amer: 68 mL/min — ABNORMAL LOW (ref >90)
Glucose, Bld: 126 mg/dL — ABNORMAL HIGH (ref 70–99)
Potassium: 3.8 mEq/L (ref 3.5–5.1)
Sodium: 134 mEq/L — ABNORMAL LOW (ref 135–145)

## 2012-12-08 LAB — PROTEIN ELECTROPHORESIS, SERUM
Albumin ELP: 55.8 % (ref 55.8–66.1)
Alpha-1-Globulin: 6.2 % — ABNORMAL HIGH (ref 2.9–4.9)
Alpha-2-Globulin: 10.3 % (ref 7.1–11.8)
Gamma Globulin: 13.8 % (ref 11.1–18.8)
Total Protein ELP: 5.9 g/dL — ABNORMAL LOW (ref 6.0–8.3)

## 2012-12-08 LAB — UIFE/LIGHT CHAINS/TP QN, 24-HR UR
Albumin, U: DETECTED
Beta, Urine: DETECTED — AB
Free Lambda Lt Chains,Ur: 0.43 mg/dL (ref 0.02–0.67)

## 2012-12-08 LAB — GLUCOSE, CAPILLARY
Glucose-Capillary: 126 mg/dL — ABNORMAL HIGH (ref 70–99)
Glucose-Capillary: 102 mg/dL — ABNORMAL HIGH (ref 70–99)

## 2012-12-08 LAB — MRSA PCR SCREENING: MRSA by PCR: NEGATIVE

## 2012-12-08 LAB — HEPARIN LEVEL (UNFRACTIONATED): Heparin Unfractionated: 0.1 IU/mL — ABNORMAL LOW (ref 0.30–0.70)

## 2012-12-08 SURGERY — RIGHT HEART CATH
Anesthesia: LOCAL

## 2012-12-08 MED ORDER — ACETAMINOPHEN 325 MG PO TABS: 650.0000 mg | ORAL_TABLET | ORAL | Status: DC | PRN

## 2012-12-08 MED ORDER — FUROSEMIDE 10 MG/ML IJ SOLN
10.0000 mg/h | INTRAVENOUS | Status: DC
  Administered 2012-12-08 – 2012-12-11 (×4): 10 mg/h via INTRAVENOUS
  Filled 2012-12-08 (×13): qty 25

## 2012-12-08 MED ORDER — SODIUM CHLORIDE 0.9 % IJ SOLN: 3.0000 mL | INTRAMUSCULAR | Status: DC | PRN

## 2012-12-08 MED ORDER — HEPARIN (PORCINE) IN NACL 100-0.45 UNIT/ML-% IJ SOLN
1500.0000 [IU]/h | INTRAMUSCULAR | Status: DC
  Administered 2012-12-08 – 2012-12-09 (×2): 1500 [IU]/h via INTRAVENOUS
  Filled 2012-12-08 (×2): qty 250

## 2012-12-08 MED ORDER — SODIUM CHLORIDE 0.9 % IV SOLN: 250.0000 mL | INTRAVENOUS | Status: DC | PRN

## 2012-12-08 MED ORDER — ONDANSETRON HCL 4 MG/2ML IJ SOLN: 4.0000 mg | Freq: Four times a day (QID) | INTRAMUSCULAR | Status: DC | PRN

## 2012-12-08 MED ORDER — LIDOCAINE HCL (PF) 1 % IJ SOLN
INTRAMUSCULAR | Status: AC
  Filled 2012-12-08: qty 30

## 2012-12-08 MED ORDER — HEPARIN (PORCINE) IN NACL 2-0.9 UNIT/ML-% IJ SOLN
INTRAMUSCULAR | Status: AC
  Filled 2012-12-08: qty 1000

## 2012-12-08 MED ORDER — MORPHINE SULFATE ER 30 MG PO TBCR
30.0000 mg | EXTENDED_RELEASE_TABLET | Freq: Two times a day (BID) | ORAL | Status: DC
  Administered 2012-12-08 – 2012-12-17 (×18): 30 mg via ORAL
  Filled 2012-12-08: qty 1
  Filled 2012-12-08: qty 2
  Filled 2012-12-08 (×3): qty 1
  Filled 2012-12-08 (×2): qty 2
  Filled 2012-12-08: qty 1
  Filled 2012-12-08 (×2): qty 2
  Filled 2012-12-08: qty 1
  Filled 2012-12-08 (×3): qty 2
  Filled 2012-12-08: qty 1
  Filled 2012-12-08 (×5): qty 2

## 2012-12-08 MED ORDER — HYDRALAZINE HCL 50 MG PO TABS
50.0000 mg | ORAL_TABLET | Freq: Three times a day (TID) | ORAL | Status: DC
  Administered 2012-12-08 – 2012-12-10 (×5): 50 mg via ORAL
  Filled 2012-12-08 (×9): qty 1

## 2012-12-08 MED ORDER — MILRINONE IN DEXTROSE 20 MG/100ML IV SOLN
0.2500 ug/kg/min | INTRAVENOUS | Status: DC
  Administered 2012-12-08 – 2012-12-10 (×4): 0.25 ug/kg/min via INTRAVENOUS
  Filled 2012-12-08 (×4): qty 100

## 2012-12-08 MED ORDER — SODIUM CHLORIDE 0.9 % IJ SOLN: 3.0000 mL | Freq: Two times a day (BID) | INTRAMUSCULAR | Status: DC

## 2012-12-08 MED ORDER — FENTANYL CITRATE 0.05 MG/ML IJ SOLN
INTRAMUSCULAR | Status: AC
  Filled 2012-12-08: qty 2

## 2012-12-08 MED ORDER — MIDAZOLAM HCL 2 MG/2ML IJ SOLN
INTRAMUSCULAR | Status: AC
  Filled 2012-12-08: qty 2

## 2012-12-08 MED ORDER — SODIUM CHLORIDE 0.9 % IV SOLN
250.0000 mL | INTRAVENOUS | Status: DC | PRN
  Administered 2012-12-08 – 2012-12-09 (×2): via INTRAVENOUS

## 2012-12-08 NOTE — Progress Notes (Signed)
Pt still having pain in toes right foot requiring narcotics  Filed Vitals:   12/07/12 1728 12/07/12 2100 12/08/12 0516 12/08/12 0532  BP: 122/80 135/87 149/80 142/98  Pulse: 78 70  77  Temp:  97.5 F (36.4 C)  97.9 F (36.6 C)  TempSrc:  Oral  Oral  Resp:  18  20  Height:      Weight:    290 lb 3.2 oz (131.634 kg)  SpO2:  100%  92%   Right foot 2+ DP pulse, right 3rd and 4th toe dusky, no open wound  Assessment: Ischemic toes 3 and 4 right foot.  Will need amputation of these for pain control but would like for cardiac eval to be a little more complete prior to this.  Procedure is semielective and would not want to put him at risk for significant MI if his depressed EF is related to significant coronary disease.  Plan: Continue pain medication for now.  Amputation of toes when coronary work up more complete.  Will also need CTA of arterial tree chest abd pelvis with runoff to rule out arterial embolic source but will defer this for now until renal function improved.  Will recheck Thursday.  Fabienne Bruns, MD [facility] Office: [phone] Pager: [phone]

## 2012-12-08 NOTE — CV Procedure (Addendum)
Cardiac Cath Procedure Note:  Indication:  Heart failure  Procedures performed:  1) Right heart catheterization  Description of procedure:   The risks and indication of the procedure were explained. Consent was signed and placed on the chart. An appropriate timeout was taken prior to the procedure. The right neck was prepped and draped in the routine sterile fashion and anesthetized with 1% local lidocaine.   A 7 FR venous sheath was placed in the right internal jugular vein using a modified Seldinger technique. A standard Swan-Ganz catheter was used for the procedure.   Complications: None apparent.  Findings:  RA = 18 RV = 66/8/22 PA = 63/26 (41) PCW = 20 Fick cardiac output/index = 4.6/1.9 Thermodilution CO/CI =  3.6/1.5 PVR = 4.6 Woods (Fick) FA sat = 99% (pulse ox) PA sat = 55%, 56%  Assessment: 1. Moderate pulmonary HTN with cor pulmonale and low cardiac output 2. Mildly elevated L-sided pressures 3. Relative equalization of intracardiac pressures suggestive of possible restrictive physiology vs constriction  Plan/Discussion:  RHC cath data suggestive R > LHF with possible restrictive/constrictive physiology and low cardiac output. Will start milrinone and lasix gtt.   Jose Petersen 4:27 PM

## 2012-12-08 NOTE — Progress Notes (Addendum)
ANTICOAGULATION CONSULT NOTE - Initial Consult  Pharmacy Consult for Heparin Indication: ACS/possible LV emboli   Allergies  Allergen Reactions  . Toradol (Ketorolac Tromethamine) Hives  . Motrin (Ibuprofen) Other (See Comments)    Pt has ulcer    Patient Measurements: Height: 5\' 9"  (175.3 cm) Weight: 290 lb 3.2 oz (131.634 kg) IBW/kg (Calculated) : 70.7  Heparin Dosing Weight: 100kg  Vital Signs: Temp: 97.4 F (36.3 C) (01/07 1706) Temp src: Oral (01/07 1706) BP: 145/107 mmHg (01/07 1706) Pulse Rate: 73  (01/07 1706)  Labs:  Basename 12/08/12 0525 12/07/12 0550 12/06/12 0808  HGB 11.7* 11.7* --  HCT 37.8* 38.6* 36.3*  PLT 371 383 387  APTT -- -- --  LABPROT -- -- --  INR -- -- --  HEPARINUNFRC 0.10* 0.11* 0.36  CREATININE 1.38* 1.45* 1.26  CKTOTAL -- -- --  CKMB -- -- --  TROPONINI -- -- --    Estimated Creatinine Clearance: 88.1 ml/min (by C-G formula based on Cr of 1.38).   Medical History: Past Medical History  Diagnosis Date  . Coronary artery disease     a. 4V CABG 2007 in [place], possible mild MI. b. Repeat cath ~2009-2010 pre-op knee surgery that was reportedly "clean" per pt.  . Hypertension   . Gout   . Diverticulitis   . Hyperlipidemia   . GI bleed     a. Anemia/GIB with bleeding duodenal ulcer 08/2012.  Marland Kitchen Myocardial infarction 2007    "small one before OHS" (12/03/2012)  . Type II diabetes mellitus 2013  . Anxiety     "over the last couple weeks" (12/03/2012)  . Kidney stones ~ 1989; 10/2012  . Iron deficiency anemia     a. GIB 08/2012.  . Cardiomyopathy     a. EF 45-50% 08/2012.    Assessment: 74 YOM with hx of CAD, admitted HF, EF 15% with RV dysfunction, s/p RHC this afternoon. Pharmacy is consulted to start heparin after cath. Pt. Has a Swan-Ganz catheter, will start heparin heparin infusion with no bolus at 7pm per discussion with Dr. Gala Romney. Patient was previously on heparin infusion (1/4-1/5 for R/O PE), heparin level was therapeutic on  1600 units/hr. Hgb 11.7, plt 371. Currently on sq heparin for VTE prophylaxis.  Goal of Therapy:  Heparin level 0.3-0.7 units/ml Monitor platelets by anticoagulation protocol: Yes   Plan:  - Start heparin infusion with no bolus at previous therapeutic rate 1500 units/hr - Heparin level 6 hrs after infusion start - Daily heparin level and cbc with Am labs. - D/c sq heparin  Bayard Hugger, PharmD, BCPS  Clinical Pharmacist  Pager: [phone]  12/08/2012,6:11 PM

## 2012-12-08 NOTE — Progress Notes (Signed)
[facility]                                                                                Patient Demographics  Jose Petersen, is a 47 y.o. male  WUJ:[id number]  NFA:[id number]  DOB - 13-Aug-1966  Admit date - 12/03/2012  Admitting Physician Hollice Espy, MD  Outpatient Primary MD for the patient is Default, Provider, MD  LOS - 5   Chief Complaint  Patient presents with  . Shortness of Breath  . Foot Pain        Assessment & Plan   47 year old Caucasian male admitted to the hospital with shortness of breath, massive leg edema and pain in his toes and feet, he has history of chronic systolic heart failure along with gout, however he presented with multiple cyanotic toes which have been going on for the last 4-6 weeks, upon admission his workup was consistent with worsening of his EF from 45% to 15% for which cardiology is following, cyanotic toes every 2 likely small vessel disease per vascular surgery was following the patient, gout in the left foot, along with massive proteinuria suspicious for nephrotic syndrome in a diabetic patient, renal to see the patient on 12/07/2038, patient will likely need amputation of his right second and third toe by vascular surgery, left heart catheterization if his creatinine permits.    1. Massive edema mostly in his legs lungs relatively spared. Patient claims to have 50 pound weight gain, has chronic systolic CHF with EF of 15% has recently worsened, however has more than 300 protein in his urine on UA, and a half grams of protein in 24 hours in urine collection,. Likely due to nephrotic syndrome causing anasarca along with acute on chronic heart failure, nephrologist Dr. Hyman Hopes has been consulted and he is dozing diuretics. Cardiology following the patient also, renal function remains questionable. We will monitor eyes daily BMP and edema closely.     2. CAD status post CABG in the past with Chronic systolic heart failure  with EF 15% which has worsened from 45% recently. Cardiology following the patient, patient appears to be related he compensated at least from the left heart standpoint, his edema is mostly peripheral, likely due to proteinuria plus minus heart failure, pulmonary artery pressures are mildly elevated at 43 MM of mercury. Defer CHF management to cardiology at this point. He has been placed on fluid restriction, ACE/ARB  held due to acute renal insufficiency,  Continue Coreg . Monitor on present dose Lasix.     3. Hypertension - on Coreg, we have added hydralazine along with when necessary Catapres as needed.     4. Non-oliguric Acute renal failure with massive proteinuria (Nephrotic) - Renal studies as dictated in #1 above, renal to see the patient, renal ultrasound stable, refills and dozing diuretics, since creatinine is around 1.5, will hold ACE/ARB for now. Serial BMPs.      5. Left foot gout, not much benefit from prednisone and colchicine in the past, cannot give him NSAIDs due to acute renal failure, will check a chest level and place him on colchicine, stable x-ray of left foot.Pain is improving.  6. Right second and third toe, left First toe discoloration (With question of toe necrosis in the right foot) -  ABI stable, vascular surgery consult Appreciated, transthoracic echo free of any thrombus. Per vascular surgery changes most likely due to small vessel disease, We'll add low-dose aspirin already on statin,,We'll continue supportive care ,Discussed the case with Dr. Arbie Cookey on 12/05/2012,And 12/06/2012 bedside, for now no full dose anticoagulation, Mildly high d-dimer Which was ordered by cardiology is deemed nonspecific in the light of toe necrosis, lower extremity ultrasound per Dr. Arbie Cookey most likely is negative and there are no acute lots, discussed this with Dr. Arbie Cookey along with cardiologist Dr. Ladona Ridgel we all concurred that patient does not full dose anticoagulation at this time.  Vascular surgery following the patient closely will likely need amputation of his right second and third toes.  Once creatinine stabilizes he will need CT scan chest abdomen pelvis ordered by vascular surgery to look for any peripheral embolic source for his necrotic toes, have discussed his case with Dr. Darrick Penna on 12/08/2012.     7. DM type II. Agree with holding by mouth medications, continue sliding scale insulin.  Lab Results  Component Value Date   HGBA1C 7.5* 08/28/2012    CBG (last 3)   Basename 12/08/12 1109 12/08/12 0548 12/07/12 2110  GLUCAP 138* 126* 116*     8. Iron deficiency anemia chronic. No acute issues, continue her iron supplementation.     9. Hypothyroidism TSH only mildly elevated with free T3-T4, outpatient T3-T4 and TSH monitoring by PCP.     10. Mild leukocytosis- non-specific, could be due to recent steroid treatment, Had Stable 2 view chest x-ray ,Remains afebrile, Now leukocytosis resolved.      Code Status: full  Family Communication: with the patient  Disposition Plan: Home   Procedures ABI, renal ultrasound, echo gram, SPEP, UPEP, protein creatinine ratio in urine   Consults  cardiology Dr. Patty Sermons and Jens Som, vascular surgery Dr. Darrick Penna, nephrology Dr. Hyman Hopes  DVT Prophylaxis  Heparin  Lab Results  Component Value Date   PLT 371 12/08/2012    Medications  Scheduled Meds:    . aspirin  81 mg Oral Daily  . atorvastatin  10 mg Oral q1800  . carvedilol  3.125 mg Oral BID WC  . colchicine  0.6 mg Oral BID  . ferrous gluconate  324 mg Oral TID WC  . furosemide  80 mg Intravenous BID  . heparin subcutaneous  5,000 Units Subcutaneous Q8H  . hydrALAZINE  50 mg Oral Q8H  . insulin aspart  0-9 Units Subcutaneous TID WC  . isosorbide mononitrate  30 mg Oral Daily  . morphine  30 mg Oral Q12H  . pantoprazole  40 mg Oral BID  . sodium chloride  3 mL Intravenous Q12H   Continuous Infusions:   PRN Meds:.sodium chloride,  acetaminophen, cloNIDine, HYDROcodone-acetaminophen, HYDROmorphone (DILAUDID) injection, ondansetron (ZOFRAN) IV, sodium chloride  Antibiotics    Anti-infectives    None       Time Spent in minutes   45   SINGH,PRASHANT K M.D on 12/08/2012 at 12:20 PM  Between 7am to 7pm - Pager - [phone]  After 7pm go to [email or web address] - password [email or web address]  And look for the night coverage person covering for me after hours  [facility] Office  [phone]    Subjective:   Jose Petersen today has, No headache, No chest pain, No abdominal pain - No Nausea, No new weakness tingling or numbness, No Cough -  SOB. Has right-sided second and third toe pain along with left foot pain from gout  Objective:   Filed Vitals:   12/07/12 1728 12/07/12 2100 12/08/12 0516 12/08/12 0532  BP: 122/80 135/87 149/80 142/98  Pulse: 78 70  77  Temp:  97.5 F (36.4 C)  97.9 F (36.6 C)  TempSrc:  Oral  Oral  Resp:  18  20  Height:      Weight:    131.634 kg (290 lb 3.2 oz)  SpO2:  100%  92%    Wt Readings from Last 3 Encounters:  12/08/12 131.634 kg (290 lb 3.2 oz)  12/08/12 131.634 kg (290 lb 3.2 oz)  08/30/12 110.7 kg (244 lb 0.8 oz)     Intake/Output Summary (Last 24 hours) at 12/08/12 1220 Last data filed at 12/08/12 0600  Gross per 24 hour  Intake    368 ml  Output    950 ml  Net   -582 ml    Exam Awake Alert, Oriented X 3, No new F.N deficits, Normal affect [place].AT,PERRAL Supple Neck,No JVD, No cervical lymphadenopathy appriciated.  Symmetrical Chest wall movement, Good air movement bilaterally, CTAB RRR,No Gallops,Rubs or new Murmurs, No Parasternal Heave +ve B.Sounds, Abd Soft, Non tender, No organomegaly appriciated, No rebound - guarding or rigidity. No Cyanosis, 2-3+ edema in legs up to his waist, right second and third toes discolored and cyanotic , left big toe mildly cyanotic at the tip   Data Review   Micro Results No results found for this or any previous visit  (from the past 240 hour(s)).  Radiology Reports Dg Chest 2 View  12/03/2012  *RADIOLOGY REPORT*  Clinical Data: 47 year old male with swelling, weight gain, weakness.  CHEST - 2 VIEW  Comparison: 08/29/2012 and earlier.  Findings: Sequelae of CABG.  Stable median sternotomy wires.  Lower lung volumes.  Patchy opacity at the lung bases, greater on the left.  There is a small left pleural effusion.  No overt pulmonary edema.  No pneumothorax. Stable cardiomegaly and mediastinal contours.  Visualized tracheal air column is within normal limits. No acute osseous abnormality identified.  IMPRESSION: New small left pleural effusion.  Patchy left basilar opacity, favor atelectasis but developing pneumonia is difficult to exclude. No acute pulmonary edema.   Original Report Authenticated By: Erskine Speed, M.D.    Dg Foot Complete Left  12/03/2012  **ADDENDUM** CREATED: 12/03/2012 14:52:22  Incidental Achilles spur.  **END ADDENDUM** SIGNED BY: Elsie Stain, M.D.   12/03/2012  *RADIOLOGY REPORT*  Clinical Data: Swelling and pain in both feet.  History of gout.  LEFT FOOT - COMPLETE 3+ VIEW  Comparison: None.  Findings: There is no evidence of fracture or dislocation.  There is no evidence of arthropathy or other focal bony abnormality. There is diffuse soft tissue swelling without osseous erosion.  IMPRESSION: Soft tissue swelling.  No focal bony abnormality.   Original Report Authenticated By: Davonna Belling, M.D.    Dg Foot Complete Right  12/03/2012  *RADIOLOGY REPORT*  Clinical Data: Swelling and pain.  History of gout.  RIGHT FOOT COMPLETE - 3+ VIEW  Comparison: None.  Findings: Mild soft tissue swelling proximally and about the ankle. There is no evidence of fracture or dislocation.  There is no evidence of arthropathy or other focal bony abnormality.  No erosive lesion is identified. Plantar are and Achilles spurs are noted.  IMPRESSION: Mild soft tissue swelling.  Focal bony abnormality.   Original Report  Authenticated By: Davonna Belling,  M.D.     [facility]  Lab 12/08/12 1610 12/07/12 0550 12/06/12 9604 12/05/12 0129 12/03/12 2055  WBC 7.8 7.1 8.0 12.7* 12.5*  HGB 11.7* 11.7* 11.3* 13.5 12.2*  HCT 37.8* 38.6* 36.3* 44.4 40.3  PLT 371 383 387 414* 440*  MCV 74.9* 75.4* 74.1* 74.2* 75.0*  MCH 23.2* 22.9* 23.1* 22.6* 22.7*  MCHC 31.0 30.3 31.1 30.4 30.3  RDW 18.0* 17.5* 17.3* 17.6* 17.5*  LYMPHSABS -- -- -- -- --  MONOABS -- -- -- -- --  EOSABS -- -- -- -- --  BASOSABS -- -- -- -- --  BANDABS -- -- -- -- --    Chemistries   Lab 12/08/12 0525 12/07/12 0550 12/06/12 0808 12/05/12 0129 12/04/12 0837 12/04/12 0834  NA 134* 135 133* 135 -- 138  K 3.8 3.9 3.4* 3.6 -- 4.3  CL 94* 96 97 96 -- 101  CO2 27 25 23 23  -- 21  GLUCOSE 126* 146* 132* 118* -- 140*  BUN 19 20 20 23  -- 25*  CREATININE 1.38* 1.45* 1.26 1.35 -- 1.53*  CALCIUM 9.5 9.1 8.7 9.5 -- 9.3  MG -- -- -- 1.8 -- --  AST -- -- -- -- 20 --  ALT -- -- -- -- 12 --  ALKPHOS -- -- -- -- 96 --  BILITOT -- -- -- -- 0.8 --   ------------------------------------------------------------------------------------------------------------------ estimated creatinine clearance is 88.1 ml/min (by C-G formula based on Cr of 1.38). ------------------------------------------------------------------------------------------------------------------ No results found for this basename: HGBA1C:2 in the last 72 hours ------------------------------------------------------------------------------------------------------------------ No results found for this basename: CHOL:2,HDL:2,LDLCALC:2,TRIG:2,CHOLHDL:2,LDLDIRECT:2 in the last 72 hours ------------------------------------------------------------------------------------------------------------------ No results found for this basename: TSH,T4TOTAL,FREET3,T3FREE,THYROIDAB in the last 72 hours ------------------------------------------------------------------------------------------------------------------ No  results found for this basename: VITAMINB12:2,FOLATE:2,FERRITIN:2,TIBC:2,IRON:2,RETICCTPCT:2 in the last 72 hours  Coagulation profile No results found for this basename: INR:5,PROTIME:5 in the last 168 hours  No results found for this basename: DDIMER:2 in the last 72 hours  Cardiac Enzymes  Lab 12/04/12 0837 12/04/12 0253 12/03/12 2055  CKMB -- -- --  TROPONINI <0.30 <0.30 <0.30  MYOGLOBIN -- -- --   ------------------------------------------------------------------------------------------------------------------ No components found with this basename: POCBNP:3

## 2012-12-08 NOTE — Progress Notes (Signed)
  Patient seen and examined. D/W Dr. Jens Som.  [name] is a 47 y/o male with h/o CAD and ischemic CM s/p MI and previous CABG 2007. Last cath 2009 with patent grafts and EF 45%.   Now admitted with anasarca and 50lb weight gain. Echo with EF 15% with RV dysfunction. Has had progressive HF symptoms for several months. No angina like he had with MI. Has had sluggish response to diuretics. Course c/b emboli to toes and nephrotic range proteinuria felt secondary to DM2.  On exam prominent S3 with marked edema.  My suspicion is that he has a low output HF in the setting of a marked decrease in his EF due to a nonischemic component. I feel he needs RHC and probable inotropic support. Agree with eventual TEE to exclude LV clot and also to evaluate aorta. Would not perform coronary angio at this point due to CRI and possibility of aortic athero emboli.  Discussed with patient and his sister at length and agree to proceed with RHC today.  Jose Rash,MD 4:00 PM

## 2012-12-08 NOTE — Progress Notes (Signed)
[facility] ROUNDING NOTE   Subjective:   Interval History: still edematous but good response to diuretics  Objective:  Vital signs in last 24 hours:  Temp:  [97.5 F (36.4 C)-98.1 F (36.7 C)] 97.9 F (36.6 C) (01/07 0532) Pulse Rate:  [70-78] 77  (01/07 0532) Resp:  [18-20] 20  (01/07 0532) BP: (122-149)/(80-98) 142/98 mmHg (01/07 0532) SpO2:  [92 %-100 %] 92 % (01/07 0532) Weight:  [131.634 kg (290 lb 3.2 oz)] 131.634 kg (290 lb 3.2 oz) (01/07 0532)  Weight change: -0.166 kg (-5.9 oz) Filed Weights   12/06/12 0537 12/07/12 0537 12/08/12 0532  Weight: 132.904 kg (293 lb) 131.8 kg (290 lb 9.1 oz) 131.634 kg (290 lb 3.2 oz)    Intake/Output: I/O last 3 completed shifts: In: 671 [P.O.:660; I.V.:3; IV Piggyback:8] Out: 1600 [Urine:1600]   Intake/Output this shift:     CVS- RRR RS- CTA ABD- BS present soft non-distended EXT- 3 +edema Digital ischemic toes   Basic Metabolic Panel:  Lab 12/08/12 1914 12/07/12 0550 12/06/12 0808 12/05/12 0129 12/04/12 0834  NA 134* 135 133* 135 138  K 3.8 3.9 3.4* 3.6 4.3  CL 94* 96 97 96 101  CO2 27 25 23 23 21   GLUCOSE 126* 146* 132* 118* 140*  BUN 19 20 20 23  25*  CREATININE 1.38* 1.45* 1.26 1.35 1.53*  CALCIUM 9.5 9.1 8.7 -- --  MG -- -- -- 1.8 --  PHOS -- -- -- -- --    Liver Function Tests:  Lab 12/04/12 0837  AST 20  ALT 12  ALKPHOS 96  BILITOT 0.8  PROT 6.5  ALBUMIN 3.4*   No results found for this basename: LIPASE:5,AMYLASE:5 in the last 168 hours No results found for this basename: AMMONIA:3 in the last 168 hours  CBC:  Lab 12/08/12 0525 12/07/12 0550 12/06/12 0808 12/05/12 0129 12/03/12 2055  WBC 7.8 7.1 8.0 12.7* 12.5*  NEUTROABS -- -- -- -- --  HGB 11.7* 11.7* 11.3* 13.5 12.2*  HCT 37.8* 38.6* 36.3* 44.4 40.3  MCV 74.9* 75.4* 74.1* 74.2* 75.0*  PLT 371 383 387 414* 440*    Cardiac Enzymes:  Lab 12/04/12 0837 12/04/12 0253 12/03/12 2055  CKTOTAL -- -- --  CKMB -- -- --  CKMBINDEX  -- -- --  TROPONINI <0.30 <0.30 <0.30    BNP: No components found with this basename: POCBNP:5  CBG:  Lab 12/08/12 0548 12/07/12 2110 12/07/12 1637 12/07/12 1123 12/07/12 0641  GLUCAP 126* 116* 179* 170* 150*    Microbiology: Results for orders placed during the hospital encounter of 08/27/12  URINE CULTURE     Status: Normal   Collection Time   08/27/12  7:01 PM      Component Value Range Status Comment   Specimen Description URINE, CATHETERIZED   Final    Special Requests NONE   Final    Culture  Setup Time 08/28/2012 01:13   Final    Colony Count NO GROWTH   Final    Culture NO GROWTH   Final    Report Status 08/28/2012 FINAL   Final   MRSA PCR SCREENING     Status: Normal   Collection Time   08/27/12  9:31 PM      Component Value Range Status Comment   MRSA by PCR NEGATIVE  NEGATIVE Final     Coagulation Studies: No results found for this basename: LABPROT:5,INR:5 in the last 72 hours  Urinalysis: No results found for this basename: COLORURINE:2,APPERANCEUR:2,LABSPEC:2,PHURINE:2,GLUCOSEU:2,HGBUR:2,BILIRUBINUR:2,KETONESUR:2,PROTEINUR:2,UROBILINOGEN:2,NITRITE:2,LEUKOCYTESUR:2 in  the last 72 hours    Imaging: No results found.   Medications:        . aspirin  81 mg Oral Daily  . atorvastatin  10 mg Oral q1800  . carvedilol  3.125 mg Oral BID WC  . colchicine  0.6 mg Oral BID  . ferrous gluconate  324 mg Oral TID WC  . furosemide  80 mg Intravenous BID  . heparin subcutaneous  5,000 Units Subcutaneous Q8H  . hydrALAZINE  50 mg Oral Q8H  . insulin aspart  0-9 Units Subcutaneous TID WC  . isosorbide mononitrate  30 mg Oral Daily  . morphine  30 mg Oral Q12H  . pantoprazole  40 mg Oral BID  . sodium chloride  3 mL Intravenous Q12H   sodium chloride, acetaminophen, cloNIDine, HYDROcodone-acetaminophen, HYDROmorphone (DILAUDID) injection, ondansetron (ZOFRAN) IV, sodium chloride  Assessment/ Plan:  Nephrotic syndrome with anasarca secondary to diabetic.    Anemia No ESA  HTN/ VOL Continue to diurese Will need out outpatient follow up   LOS: 5 Jose Petersen W @TODAY @10 :43 AM

## 2012-12-08 NOTE — Progress Notes (Signed)
Attempted to give pt 1400 med , pt in the bathroom at this time.  Transport from cath lab  To take pt down also.  Informed cath lab nurse and also pt requesting pain med at this time.   Cath lab nurse informed pt she will give him some pain med.   Pt verbalized understanding.  Amanda Pea, Charity fundraiser.

## 2012-12-08 NOTE — Progress Notes (Signed)
Patient ID: Jose Petersen, male   DOB: June 10, 1966, 47 y.o.   MRN: [id number] Subjective:  C/o bilateral foot pain; denies dyspnea or chest pain  Objective:  Vital Signs in the last 24 hours: Temp:  [97.5 F (36.4 C)-98.1 F (36.7 C)] 97.9 F (36.6 C) (01/07 0532) Pulse Rate:  [70-78] 77  (01/07 0532) Resp:  [18-20] 20  (01/07 0532) BP: (122-149)/(80-98) 142/98 mmHg (01/07 0532) SpO2:  [92 %-100 %] 92 % (01/07 0532) Weight:  [290 lb 3.2 oz (131.634 kg)] 290 lb 3.2 oz (131.634 kg) (01/07 0532)  Intake/Output from previous day: 01/06 0701 - 01/07 0700 In: 300 [P.O.:300] Out: 1100 [Urine:1100] Intake/Output from this shift: Total I/O In: -  Out: 400 [Urine:400]  Physical Exam: WD, obese, NAD HEENT: Unremarkable Neck:  supple Lungs:  CTA HEART:  Regular rate rhythm, distant, no murmurs, no rubs, no clicks Abd:  obese, mildly distended, NT Ext:  2+ edema; right toes blue. Neuro:  CN II through XII intact, motor grossly intact  Lab Results:  Basename 12/08/12 0525 12/07/12 0550  WBC 7.8 7.1  HGB 11.7* 11.7*  PLT 371 383    Basename 12/07/12 0550 12/06/12 0808  NA 135 133*  K 3.9 3.4*  CL 96 97  CO2 25 23  GLUCOSE 146* 132*  BUN 20 20  CREATININE 1.45* 1.26    Cardiac Studies: Tele - nsr, PVCs Assessment/Plan:  1.acute systolic CHF with worsening LV dysfunction/CM 2. Blue toe syndrome 3. H/o Gi bleeding 4. Elevated D dimer 5. Gout 6. Proteinuria Etiology of cardiomyopathy unclear. He does have a history of coronary artery bypass and graft. Cardiac catheterization several years ago with patent grafts per patient [facility]). No recent chest pain. I would avoid cardiac catheterization at this point given renal insufficiency and also question of peripheral embolic syndrome. He did have a viral syndrome approximately one week prior to his CHF symptoms. 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 He is remains volume overloaded. Continue Lasix 80 mg IV twice a day. Continue low-dose  Coreg. Continue hydralazine (increase to 50 mg po TID) and nitrates. Titrate as tolerated by blood pressure. ACE inhibitors are being avoided for now given renal insufficiency. This will need to be reconsidered as renal function stabilizes. Vascular surgery is evaluating patient for his ischemic toes (may need amputation of digits). Question whether he may have peripheral embolic syndrome. Note he also has renal insufficiency. Avoid CTA of his aorta given renal insufficiency and risk of contrast nephropathy. Will consider TEE to R/O SOE as CHF improves. Also with near nephrotic range proteinuria. Nephrology to see. Management of gout per primary care. Patient will need CHF consult.  LOS: 5 days    Cullen Lahaie 12/08/2012, 6:45 AM

## 2012-12-09 DIAGNOSIS — I2510 Atherosclerotic heart disease of native coronary artery without angina pectoris: Secondary | ICD-10-CM

## 2012-12-09 DIAGNOSIS — I50810 Right heart failure, unspecified: Secondary | ICD-10-CM

## 2012-12-09 DIAGNOSIS — N179 Acute kidney failure, unspecified: Secondary | ICD-10-CM

## 2012-12-09 LAB — CBC
Hemoglobin: 10.6 g/dL — ABNORMAL LOW (ref 13.0–17.0)
MCH: 22.6 pg — ABNORMAL LOW (ref 26.0–34.0)
MCHC: 30.8 g/dL (ref 30.0–36.0)
MCV: 73.5 fL — ABNORMAL LOW (ref 78.0–100.0)
RBC: 4.68 MIL/uL (ref 4.22–5.81)

## 2012-12-09 LAB — CARBOXYHEMOGLOBIN
Carboxyhemoglobin: 1.7 % — ABNORMAL HIGH (ref 0.5–1.5)
Methemoglobin: 1.7 % — ABNORMAL HIGH (ref 0.0–1.5)
O2 Saturation: 79.1 %
Total hemoglobin: 10.5 g/dL — ABNORMAL LOW (ref 13.5–18.0)

## 2012-12-09 LAB — GLUCOSE, CAPILLARY
Glucose-Capillary: 129 mg/dL — ABNORMAL HIGH (ref 70–99)
Glucose-Capillary: 134 mg/dL — ABNORMAL HIGH (ref 70–99)
Glucose-Capillary: 129 mg/dL — ABNORMAL HIGH (ref 70–99)

## 2012-12-09 LAB — BASIC METABOLIC PANEL
BUN: 17 mg/dL (ref 6–23)
CO2: 29 mEq/L (ref 19–32)
GFR calc non Af Amer: 64 mL/min — ABNORMAL LOW (ref >90)
Glucose, Bld: 128 mg/dL — ABNORMAL HIGH (ref 70–99)
Potassium: 3.1 mEq/L — ABNORMAL LOW (ref 3.5–5.1)

## 2012-12-09 LAB — HEPARIN LEVEL (UNFRACTIONATED)
Heparin Unfractionated: 0.54 IU/mL (ref 0.30–0.70)
Heparin Unfractionated: 0.61 IU/mL (ref 0.30–0.70)

## 2012-12-09 LAB — COMPREHENSIVE METABOLIC PANEL
ALT: 15 U/L (ref 0–53)
Albumin: 3.6 g/dL (ref 3.5–5.2)
Alkaline Phosphatase: 101 U/L (ref 39–117)
BUN: 15 mg/dL (ref 6–23)
Calcium: 9.2 mg/dL (ref 8.4–10.5)
Potassium: 3.3 mEq/L — ABNORMAL LOW (ref 3.5–5.1)
Sodium: 135 mEq/L (ref 135–145)
Total Protein: 6.7 g/dL (ref 6.0–8.3)

## 2012-12-09 LAB — MAGNESIUM: Magnesium: 1.5 mg/dL (ref 1.5–2.5)

## 2012-12-09 MED ORDER — HEPARIN (PORCINE) IN NACL 100-0.45 UNIT/ML-% IJ SOLN
1600.0000 [IU]/h | INTRAMUSCULAR | Status: DC
  Administered 2012-12-10: 1400 [IU]/h via INTRAVENOUS
  Administered 2012-12-10: 1450 [IU]/h via INTRAVENOUS
  Administered 2012-12-11: 1550 [IU]/h via INTRAVENOUS
  Administered 2012-12-12 – 2012-12-14 (×3): 1600 [IU]/h via INTRAVENOUS
  Filled 2012-12-09 (×12): qty 250

## 2012-12-09 MED ORDER — SPIRONOLACTONE 25 MG PO TABS
25.0000 mg | ORAL_TABLET | Freq: Two times a day (BID) | ORAL | Status: DC
  Administered 2012-12-09 – 2012-12-13 (×10): 25 mg via ORAL
  Filled 2012-12-09 (×14): qty 1

## 2012-12-09 MED ORDER — POTASSIUM CHLORIDE CRYS ER 20 MEQ PO TBCR
40.0000 meq | EXTENDED_RELEASE_TABLET | Freq: Two times a day (BID) | ORAL | Status: DC
  Administered 2012-12-09 (×2): 40 meq via ORAL
  Filled 2012-12-09 (×2): qty 2

## 2012-12-09 MED ORDER — POTASSIUM CHLORIDE 20 MEQ/15ML (10%) PO LIQD
40.0000 meq | Freq: Once | ORAL | Status: AC
  Administered 2012-12-09: 40 meq via ORAL
  Filled 2012-12-09: qty 30

## 2012-12-09 MED ORDER — INSULIN ASPART 100 UNIT/ML ~~LOC~~ SOLN
0.0000 [IU] | Freq: Three times a day (TID) | SUBCUTANEOUS | Status: DC
  Administered 2012-12-09 – 2012-12-11 (×5): 1 [IU] via SUBCUTANEOUS
  Administered 2012-12-11: 2 [IU] via SUBCUTANEOUS
  Administered 2012-12-12 (×2): 1 [IU] via SUBCUTANEOUS
  Administered 2012-12-13: 2 [IU] via SUBCUTANEOUS
  Administered 2012-12-13 – 2012-12-16 (×5): 1 [IU] via SUBCUTANEOUS

## 2012-12-09 MED ORDER — POTASSIUM CHLORIDE CRYS ER 10 MEQ PO TBCR
10.0000 meq | EXTENDED_RELEASE_TABLET | Freq: Once | ORAL | Status: AC
  Administered 2012-12-09: 10 meq via ORAL
  Filled 2012-12-09: qty 1

## 2012-12-09 MED ORDER — ZOLPIDEM TARTRATE 5 MG PO TABS
10.0000 mg | ORAL_TABLET | Freq: Every evening | ORAL | Status: DC | PRN
  Administered 2012-12-10 – 2012-12-17 (×7): 10 mg via ORAL
  Filled 2012-12-09 (×2): qty 2
  Filled 2012-12-09: qty 1
  Filled 2012-12-09 (×2): qty 2
  Filled 2012-12-09 (×2): qty 1
  Filled 2012-12-09: qty 2
  Filled 2012-12-09: qty 1

## 2012-12-09 NOTE — Progress Notes (Signed)
ANTICOAGULATION CONSULT NOTE - Follow Up Consult  Pharmacy Consult for Heparin Indication: ACS/possible LV emboli   Allergies  Allergen Reactions  . Toradol (Ketorolac Tromethamine) Hives  . Motrin (Ibuprofen) Other (See Comments)    Pt has ulcer    Patient Measurements: Height: 5\' 9"  (175.3 cm) Weight: 279 lb 1.6 oz (126.6 kg) IBW/kg (Calculated) : 70.7  Heparin Dosing Weight: 100kg  Vital Signs: Temp: 97.9 F (36.6 C) (01/08 1200) Temp src: Core (Comment) (01/08 0800) BP: 160/98 mmHg (01/08 1200) Pulse Rate: 87  (01/08 1200)  Labs:  Basename 12/09/12 1030 12/09/12 0430 12/08/12 0525 12/07/12 0550  HGB -- 10.6* 11.7* --  HCT -- 34.4* 37.8* 38.6*  PLT -- 341 371 383  APTT -- -- -- --  LABPROT -- -- -- --  INR -- -- -- --  HEPARINUNFRC 0.61 0.54 0.10* --  CREATININE -- 1.29 1.38* 1.45*  CKTOTAL -- -- -- --  CKMB -- -- -- --  TROPONINI -- -- -- --    Estimated Creatinine Clearance: 92.2 ml/min (by C-G formula based on Cr of 1.29).   Medical History: Past Medical History  Diagnosis Date  . Coronary artery disease     a. 4V CABG 2007 in [place], possible mild MI. b. Repeat cath ~2009-2010 pre-op knee surgery that was reportedly "clean" per pt.  . Hypertension   . Gout   . Diverticulitis   . Hyperlipidemia   . GI bleed     a. Anemia/GIB with bleeding duodenal ulcer 08/2012.  Marland Kitchen Myocardial infarction 2007    "small one before OHS" (12/03/2012)  . Type II diabetes mellitus 2013  . Anxiety     "over the last couple weeks" (12/03/2012)  . Kidney stones ~ 1989; 10/2012  . Iron deficiency anemia     a. GIB 08/2012.  . Cardiomyopathy     a. EF 45-50% 08/2012.    Assessment: 27 YOM with hx of CAD, admitted HF, EF 15% with RV dysfunction, s/p RHC this yesterday. Pharmacy is consulted to manage heparin after cath. Heparin level (0.61) is at-goal on 1500 units/hr but trending upward.   Goal of Therapy:  Heparin level 0.3-0.7 units/ml Monitor platelets by anticoagulation  protocol: Yes   Plan:  1. Decrease IV heparin at 1400 units/hr.  2. Daily heparin level.   Sheppard Coil, PharmD 12/09/2012,1:41 PM

## 2012-12-09 NOTE — Progress Notes (Signed)
ANTICOAGULATION CONSULT NOTE - Follow Up Consult  Pharmacy Consult for Heparin Indication: ACS/possible LV emboli   Allergies  Allergen Reactions  . Toradol (Ketorolac Tromethamine) Hives  . Motrin (Ibuprofen) Other (See Comments)    Pt has ulcer    Patient Measurements: Height: 5\' 9"  (175.3 cm) Weight: 279 lb 1.6 oz (126.6 kg) IBW/kg (Calculated) : 70.7  Heparin Dosing Weight: 100kg  Vital Signs: Temp: 97.7 F (36.5 C) (01/08 0300) Temp src: Core (Comment) (01/07 2000) BP: 188/99 mmHg (01/08 0300) Pulse Rate: 103  (01/08 0300)  Labs:  Basename 12/09/12 0430 12/08/12 0525 12/07/12 0550 12/06/12 0808  HGB 10.6* 11.7* -- --  HCT 34.4* 37.8* 38.6* --  PLT 341 371 383 --  APTT -- -- -- --  LABPROT -- -- -- --  INR -- -- -- --  HEPARINUNFRC 0.54 0.10* 0.11* --  CREATININE -- 1.38* 1.45* 1.26  CKTOTAL -- -- -- --  CKMB -- -- -- --  TROPONINI -- -- -- --    Estimated Creatinine Clearance: 86.2 ml/min (by C-G formula based on Cr of 1.38).   Medical History: Past Medical History  Diagnosis Date  . Coronary artery disease     a. 4V CABG 2007 in [place], possible mild MI. b. Repeat cath ~2009-2010 pre-op knee surgery that was reportedly "clean" per pt.  . Hypertension   . Gout   . Diverticulitis   . Hyperlipidemia   . GI bleed     a. Anemia/GIB with bleeding duodenal ulcer 08/2012.  Marland Kitchen Myocardial infarction 2007    "small one before OHS" (12/03/2012)  . Type II diabetes mellitus 2013  . Anxiety     "over the last couple weeks" (12/03/2012)  . Kidney stones ~ 1989; 10/2012  . Iron deficiency anemia     a. GIB 08/2012.  . Cardiomyopathy     a. EF 45-50% 08/2012.    Assessment: 27 YOM with hx of CAD, admitted HF, EF 15% with RV dysfunction, s/p RHC this afternoon. Pharmacy is consulted to manage heparin after cath. Heparin level (0.54) is at-goal on 1500 units/hr.   Goal of Therapy:  Heparin level 0.3-0.7 units/ml Monitor platelets by anticoagulation protocol: Yes     Plan:  1. Continue IV heparin at 1500 units/hr.  2. Heparin level in 6 hours to confirm dosing.   Lorre Munroe, PharmD 12/09/2012,5:06 AM

## 2012-12-09 NOTE — Progress Notes (Signed)
Patient ID: Jose Petersen, male   DOB: Nov 05, 1966, 47 y.o.   MRN: [id number] Subjective:   Jose Petersen is a 47 y/o male with h/o CAD and ischemic CM s/p MI and previous CABG 2007. Last cath 2009 with patent grafts and EF 45%.   Now admitted with anasarca and 50lb weight gain. Echo with EF 15% with RV dysfunction. Has had progressive HF symptoms for several months. No angina like he had with MI. Has had sluggish response to diuretics. Course c/b emboli to toes and nephrotic range proteinuria felt secondary to DM2.  Underwent placement of PA cath on 1/7. Started on milrinone and lasix gtt. Out almost 5L overnight. Weight down 11 pounds. Cr improving to 1.29. K+ 3.1. Got Kcl 40 this am.    Most recent CVP is 10. PA sat 79% (up from 56%)  Objective:  Vital Signs in the last 24 hours: Temp:  [97 F (36.1 C)-98.1 F (36.7 C)] 97 F (36.1 C) (01/08 0600) Pulse Rate:  [41-105] 76  (01/08 0600) Resp:  [12-19] 13  (01/08 0600) BP: (124-188)/(77-112) 142/84 mmHg (01/08 0600) SpO2:  [94 %-100 %] 100 % (01/08 0600) Weight:  [126.6 kg (279 lb 1.6 oz)-131.8 kg (290 lb 9.1 oz)] 126.6 kg (279 lb 1.6 oz) (01/08 0451)  Intake/Output from previous day: 01/07 0701 - 01/08 0700 In: 1764 [P.O.:1164; I.V.:592; IV Piggyback:8] Out: 6090 [Urine:6090] Intake/Output from this shift:    Physical Exam: Lying in bed. NAD. Swan in RIJ HEENT: Unremarkable Neck:  Supple. Swan OK Lungs:  CTA HEART:  Regular rate rhythm, distant, no murmurs, no rubs, no clicks. +s3 Abd:  obese, mildly distended, NT Ext:  2+ edema; right toes blue. Neuro:  CN II through XII intact, motor grossly intact  Lab Results:  Basename 12/09/12 0430 12/08/12 0525  WBC 7.9 7.8  HGB 10.6* 11.7*  PLT 341 371    Basename 12/09/12 0430 12/08/12 0525  NA 135 134*  K 3.1* 3.8  CL 94* 94*  CO2 29 27  GLUCOSE 128* 126*  BUN 17 19  CREATININE 1.29 1.38*    Cardiac Studies: Tele - nsr, PVCs     . aspirin  81 mg Oral Daily  .  atorvastatin  10 mg Oral q1800  . carvedilol  3.125 mg Oral BID WC  . colchicine  0.6 mg Oral BID  . ferrous gluconate  324 mg Oral TID WC  . hydrALAZINE  50 mg Oral Q8H  . insulin aspart  0-9 Units Subcutaneous TID WC  . isosorbide mononitrate  30 mg Oral Daily  . morphine  30 mg Oral Q12H  . pantoprazole  40 mg Oral BID  . potassium chloride  10 mEq Oral Once  . sodium chloride  3 mL Intravenous Q12H      . furosemide (LASIX) infusion 10 mg/hr (12/08/12 2018)  . heparin 1,500 Units/hr (12/08/12 1953)  . 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 H/o Gi bleeding 4. CAD s/p CABG 2007 5. Gout 6. Morbid obesity 7. A/C renal failure - improving 8. Hypokalemia 9. Hyponatremia - improved   Much improved with milrinone and lasix gtt. Weight down 10 pounds. Co-ox improved. Renal function improving. Would continue to diurese with inotropic support. Supp K+ aggressively. Will add spiro 25 bid. Continue to hold ace-I for now. Would continue to avoid LHC for now as I suspect cardiomyopathy  is non-ischemic and risk of instrumenting aorta at this point is not trivial - can consider down the road as needed. May be worth checking SPEP and UPEP. Will need TEE in next few days to look for source of emboli (?LV clot) and also check aorta. VVS following - likely need toe amp this admission. Continue heparin.   PA cath numbers checked personally this am.  The patient is critically ill with multiple organ systems failure and requires high complexity decision making for assessment and support, frequent evaluation and titration of therapies, application of advanced monitoring technologies and extensive interpretation of multiple databases.   Critical Care Time devoted to patient care services described in this note is 35 Minutes.     LOS: 6 days    Arvilla Meres 12/09/2012, 7:10  AM

## 2012-12-09 NOTE — Progress Notes (Signed)
[facility] ROUNDING NOTE   Subjective:   Interval History:edema improving  Objective:  Vital signs in last 24 hours:  Temp:  [97 F (36.1 C)-98.1 F (36.7 C)] 97.9 F (36.6 C) (01/08 1200) Pulse Rate:  [41-105] 87  (01/08 1200) Resp:  [11-19] 15  (01/08 1200) BP: (124-188)/(70-112) 169/109 mmHg (01/08 1342) SpO2:  [94 %-100 %] 99 % (01/08 1200) Weight:  [126.6 kg (279 lb 1.6 oz)-131.8 kg (290 lb 9.1 oz)] 126.6 kg (279 lb 1.6 oz) (01/08 0451)  Weight change: 0.166 kg (5.9 oz) Filed Weights   12/08/12 0532 12/08/12 1706 12/09/12 0451  Weight: 131.634 kg (290 lb 3.2 oz) 131.8 kg (290 lb 9.1 oz) 126.6 kg (279 lb 1.6 oz)    Intake/Output: I/O last 3 completed shifts: In: 1933.9 [P.O.:1284; I.V.:641.9; IV Piggyback:8] Out: 6690 [Urine:6690]   Intake/Output this shift:  Total I/O In: 489.5 [P.O.:240; I.V.:249.5] Out: 2120 [Urine:2120]  CVS- RRR RS- CTA ABD- BS present soft non-distended EXT- 2+ edema   Basic Metabolic Panel:  Lab 12/09/12 1610 12/08/12 0525 12/07/12 0550 12/06/12 0808 12/05/12 0129  NA 135 134* 135 133* 135  K 3.1* 3.8 3.9 3.4* 3.6  CL 94* 94* 96 97 96  CO2 29 27 25 23 23   GLUCOSE 128* 126* 146* 132* 118*  BUN 17 19 20 20 23   CREATININE 1.29 1.38* 1.45* 1.26 1.35  CALCIUM 9.2 9.5 9.1 -- --  MG -- -- -- -- 1.8  PHOS -- -- -- -- --    Liver Function Tests:  Lab 12/04/12 0837  AST 20  ALT 12  ALKPHOS 96  BILITOT 0.8  PROT 6.5  ALBUMIN 3.4*   No results found for this basename: LIPASE:5,AMYLASE:5 in the last 168 hours No results found for this basename: AMMONIA:3 in the last 168 hours  CBC:  Lab 12/09/12 0430 12/08/12 0525 12/07/12 0550 12/06/12 0808 12/05/12 0129  WBC 7.9 7.8 7.1 8.0 12.7*  NEUTROABS -- -- -- -- --  HGB 10.6* 11.7* 11.7* 11.3* 13.5  HCT 34.4* 37.8* 38.6* 36.3* 44.4  MCV 73.5* 74.9* 75.4* 74.1* 74.2*  PLT 341 371 383 387 414*    Cardiac Enzymes:  Lab 12/04/12 0837 12/04/12 0253 12/03/12 2055    CKTOTAL -- -- --  CKMB -- -- --  CKMBINDEX -- -- --  TROPONINI <0.30 <0.30 <0.30    BNP: No components found with this basename: POCBNP:5  CBG:  Lab 12/09/12 1133 12/09/12 0712 12/08/12 2140 12/08/12 1809 12/08/12 1109  GLUCAP 134* 129* 145* 102* 138*    Microbiology: Results for orders placed during the hospital encounter of 12/03/12  MRSA PCR SCREENING     Status: Normal   Collection Time   12/08/12  5:22 PM      Component Value Range Status Comment   MRSA by PCR NEGATIVE  NEGATIVE Final     Coagulation Studies: No results found for this basename: LABPROT:5,INR:5 in the last 72 hours  Urinalysis: No results found for this basename: COLORURINE:2,APPERANCEUR:2,LABSPEC:2,PHURINE:2,GLUCOSEU:2,HGBUR:2,BILIRUBINUR:2,KETONESUR:2,PROTEINUR:2,UROBILINOGEN:2,NITRITE:2,LEUKOCYTESUR:2 in the last 72 hours    Imaging: No results found.   Medications:      . furosemide (LASIX) infusion 10 mg/hr (12/09/12 1344)  . heparin 1,400 Units/hr (12/09/12 1347)  . milrinone 0.25 mcg/kg/min (12/09/12 1344)      . aspirin  81 mg Oral Daily  . atorvastatin  10 mg Oral q1800  . carvedilol  3.125 mg Oral BID WC  . colchicine  0.6 mg Oral BID  . ferrous gluconate  324 mg Oral TID WC  . hydrALAZINE  50 mg Oral Q8H  . insulin aspart  0-9 Units Subcutaneous TID WC  . isosorbide mononitrate  30 mg Oral Daily  . morphine  30 mg Oral Q12H  . pantoprazole  40 mg Oral BID  . potassium chloride  40 mEq Oral BID  . sodium chloride  3 mL Intravenous Q12H  . spironolactone  25 mg Oral BID   sodium chloride, sodium chloride, acetaminophen, cloNIDine, HYDROcodone-acetaminophen, HYDROmorphone (DILAUDID) injection, ondansetron (ZOFRAN) IV, sodium chloride, sodium chloride, zolpidem  Assessment/ Plan:  Nephrotic syndrome with anasarca secondary to diabetic.  Anemia No ESA  HTN/ VOL Continue to diurese Will need out outpatient follow up   LOS: 6 Jose Petersen W @TODAY @2 :07 PM

## 2012-12-09 NOTE — Progress Notes (Signed)
[facility]  Subjective  - Pain in toes slightly improved  Objective 165/97 85 97.3 F (36.3 C) (Core (Comment)) 12 98%  Intake/Output Summary (Last 24 hours) at 12/09/12 1715 Last data filed at 12/09/12 1600  Gross per 24 hour  Intake 2443.84 ml  Output   9985 ml  Net -7541.16 ml   Right 3 and 4 toes dusky, tender to palpation no real change  Assessment/Planning: Cardiac and renal status improving Tentatively will schedule for amputation of toes on Monday 12/14/12. Will recheck pt on 12/11/12  FIELDS,CHARLES E 12/09/2012 5:15 PM --  Laboratory Lab Results:  Basename 12/09/12 0430 12/08/12 0525  WBC 7.9 7.8  HGB 10.6* 11.7*  HCT 34.4* 37.8*  PLT 341 371   BMET  Basename 12/09/12 0430 12/08/12 0525  NA 135 134*  K 3.1* 3.8  CL 94* 94*  CO2 29 27  GLUCOSE 128* 126*  BUN 17 19  CREATININE 1.29 1.38*  CALCIUM 9.2 9.5    COAG Lab Results  Component Value Date   INR 1.13 08/28/2012   INR 1.09 08/27/2012   No results found for this basename: PTT    Antibiotics Anti-infectives    None

## 2012-12-10 LAB — BASIC METABOLIC PANEL
BUN: 14 mg/dL (ref 6–23)
Calcium: 9.4 mg/dL (ref 8.4–10.5)
Creatinine, Ser: 1.25 mg/dL (ref 0.50–1.35)
GFR calc Af Amer: 77 mL/min — ABNORMAL LOW (ref >90)
GFR calc non Af Amer: 67 mL/min — ABNORMAL LOW (ref >90)
Potassium: 4 mEq/L (ref 3.5–5.1)

## 2012-12-10 LAB — CBC
Hemoglobin: 10.5 g/dL — ABNORMAL LOW (ref 13.0–17.0)
MCHC: 30.5 g/dL (ref 30.0–36.0)
Platelets: 328 10*3/uL (ref 150–400)
RDW: 18.4 % — ABNORMAL HIGH (ref 11.5–15.5)

## 2012-12-10 LAB — CARBOXYHEMOGLOBIN
Carboxyhemoglobin: 1.8 % — ABNORMAL HIGH (ref 0.5–1.5)
Methemoglobin: 1.4 % (ref 0.0–1.5)
O2 Saturation: 83.7 %
Total hemoglobin: 11 g/dL — ABNORMAL LOW (ref 13.5–18.0)

## 2012-12-10 LAB — HEPARIN LEVEL (UNFRACTIONATED)
Heparin Unfractionated: 0.27 IU/mL — ABNORMAL LOW (ref 0.30–0.70)
Heparin Unfractionated: 0.31 IU/mL (ref 0.30–0.70)

## 2012-12-10 LAB — GLUCOSE, CAPILLARY: Glucose-Capillary: 150 mg/dL — ABNORMAL HIGH (ref 70–99)

## 2012-12-10 MED ORDER — SODIUM CHLORIDE 0.9 % IJ SOLN
10.0000 mL | Freq: Two times a day (BID) | INTRAMUSCULAR | Status: DC
  Administered 2012-12-10: 20 mL
  Administered 2012-12-11: 30 mL
  Administered 2012-12-11: 3 mL
  Administered 2012-12-12: 10 mL
  Administered 2012-12-12: 30 mL
  Administered 2012-12-13: 10 mL
  Administered 2012-12-13 – 2012-12-14 (×2): 30 mL
  Administered 2012-12-15: 33 mL

## 2012-12-10 MED ORDER — SODIUM CHLORIDE 0.9 % IJ SOLN
10.0000 mL | INTRAMUSCULAR | Status: DC | PRN
  Administered 2012-12-16: 30 mL
  Administered 2012-12-16: 10 mL
  Administered 2012-12-17: 30 mL

## 2012-12-10 MED ORDER — MILRINONE IN DEXTROSE 20 MG/100ML IV SOLN
0.1250 ug/kg/min | INTRAVENOUS | Status: DC
  Administered 2012-12-10: 0.125 ug/kg/min via INTRAVENOUS
  Filled 2012-12-10 (×2): qty 100

## 2012-12-10 MED ORDER — POTASSIUM CHLORIDE 10 MEQ/50ML IV SOLN
10.0000 meq | INTRAVENOUS | Status: AC
  Administered 2012-12-10 (×4): 10 meq via INTRAVENOUS
  Filled 2012-12-10 (×4): qty 50

## 2012-12-10 MED ORDER — MAGNESIUM SULFATE 40 MG/ML IJ SOLN
4.0000 g | Freq: Once | INTRAMUSCULAR | Status: AC
  Administered 2012-12-10: 4 g via INTRAVENOUS
  Filled 2012-12-10: qty 100

## 2012-12-10 MED ORDER — POTASSIUM CHLORIDE CRYS ER 20 MEQ PO TBCR
20.0000 meq | EXTENDED_RELEASE_TABLET | Freq: Two times a day (BID) | ORAL | Status: DC
  Administered 2012-12-10 – 2012-12-11 (×4): 20 meq via ORAL
  Filled 2012-12-10 (×5): qty 1

## 2012-12-10 MED ORDER — LISINOPRIL 10 MG PO TABS
10.0000 mg | ORAL_TABLET | Freq: Every day | ORAL | Status: DC
  Administered 2012-12-10 – 2012-12-12 (×3): 10 mg via ORAL
  Filled 2012-12-10 (×6): qty 1

## 2012-12-10 MED ORDER — HYDRALAZINE HCL 50 MG PO TABS
75.0000 mg | ORAL_TABLET | Freq: Three times a day (TID) | ORAL | Status: DC
  Administered 2012-12-10 – 2012-12-16 (×18): 75 mg via ORAL
  Filled 2012-12-10 (×22): qty 1

## 2012-12-10 MED ORDER — POTASSIUM CHLORIDE CRYS ER 20 MEQ PO TBCR
40.0000 meq | EXTENDED_RELEASE_TABLET | Freq: Two times a day (BID) | ORAL | Status: DC
  Administered 2012-12-10: 40 meq via ORAL
  Filled 2012-12-10 (×3): qty 2

## 2012-12-10 NOTE — Progress Notes (Signed)
ANTICOAGULATION CONSULT NOTE - Follow Up Consult  Pharmacy Consult for heparin Indication: ACS with possible LV emboli  Labs:  Basename 12/10/12 0500 12/09/12 2238 12/09/12 1030 12/09/12 0430 12/08/12 0525  HGB 10.5* -- -- 10.6* --  HCT 34.4* -- -- 34.4* 37.8*  PLT 328 -- -- 341 371  APTT -- -- -- -- --  LABPROT -- -- -- -- --  INR -- -- -- -- --  HEPARINUNFRC 0.27* -- 0.61 0.54 --  CREATININE 1.25 1.27 -- 1.29 --  CKTOTAL -- -- -- -- --  CKMB -- -- -- -- --  TROPONINI -- -- -- -- --    Assessment: 47yo male now subtherapeutic on heparin after empiric rate decrease for rising levels.  Goal of Therapy:  Heparin level 0.3-0.7 units/ml   Plan:  Will increase heparin gtt slightly to 1450 units/hr and check level in 6hr.  Colleen Can PharmD BCPS 12/10/2012,6:43 AM

## 2012-12-10 NOTE — Progress Notes (Addendum)
Patient ID: Jose Petersen, male   DOB: 12-Oct-1966, 47 y.o.   MRN: [id number] Subjective:   [name] is a 47 y/o male with h/o CAD and ischemic CM s/p MI and previous CABG 2007. Last cath 2009 with patent grafts and EF 45%.   Now admitted with anasarca and 50lb weight gain. Echo with EF 15% with RV dysfunction. Has had progressive HF symptoms for several months. No angina like he had with MI. Has had sluggish response to diuretics. Course c/b emboli to toes and nephrotic range proteinuria felt secondary to DM2.  Underwent placement of PA cath on 1/7. Started on milrinone and lasix gtt. Weight down 20 pounds yesterday (11 liters). Now down 31 pounds. Cr improving to 1.27. K+ 4.0. I supped K+ & Mag last night.    I checked PA pressures myself this am CVP 16 PAP 46/24 (32) Unable to Wedge. Co-ox 84%  Feels better. Breathing better. Stomach less distended.  Objective:  Vital Signs in the last 24 hours: Temp:  [96.8 F (36 C)-98.1 F (36.7 C)] 97.7 F (36.5 C) (01/09 0700) Pulse Rate:  [77-99] 95  (01/09 0700) Resp:  [4-25] 25  (01/09 0700) BP: (119-184)/(70-109) 150/89 mmHg (01/09 0700) SpO2:  [90 %-100 %] 97 % (01/09 0700) Weight:  [117.5 kg (259 lb 0.7 oz)] 117.5 kg (259 lb 0.7 oz) (01/09 0500)  Intake/Output from previous day: 01/08 0701 - 01/09 0700 In: 2220.5 [P.O.:1040; I.V.:1180.5] Out: 40981 [Urine:13020] Intake/Output from this shift:    Physical Exam: Lying in bed. NAD. Swan in RIJ HEENT: Unremarkable Neck:  Supple. Swan OK Lungs:  CTA HEART:  Regular rate rhythm, distant, no murmurs, no rubs, no clicks. +s3 Abd:  obese, mildly distended, NT Ext:  2+ edema; R 3/4 right toes blue. Neuro:  CN II through XII intact, motor grossly intact  Lab Results:  Basename 12/10/12 0500 12/09/12 0430  WBC 7.7 7.9  HGB 10.5* 10.6*  PLT 328 341    Basename 12/10/12 0500 12/09/12 2238  NA 133* 135  K 4.0 3.3*  CL 93* 92*  CO2 27 31  GLUCOSE 127* 152*  BUN 14 15    CREATININE 1.25 1.27    Cardiac Studies: Tele - nsr, PVCs     . aspirin  81 mg Oral Daily  . atorvastatin  10 mg Oral q1800  . carvedilol  3.125 mg Oral BID WC  . colchicine  0.6 mg Oral BID  . ferrous gluconate  324 mg Oral TID WC  . hydrALAZINE  50 mg Oral Q8H  . insulin aspart  0-9 Units Subcutaneous TID WC  . isosorbide mononitrate  30 mg Oral Daily  . morphine  30 mg Oral Q12H  . pantoprazole  40 mg Oral BID  . potassium chloride  40 mEq Oral BID  . sodium chloride  3 mL Intravenous Q12H  . spironolactone  25 mg Oral BID      . furosemide (LASIX) infusion 10 mg/hr (12/09/12 1344)  . heparin 1,450 Units/hr (12/10/12 1914)  . milrinone 0.25 mcg/kg/min (12/10/12 0021)   Assessment/Plan:  1.Acute biventricular CHF R>L     LVEF 15% 2. 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 H/o Gi bleeding 4. CAD s/p CABG 2007 5. Gout 6. Morbid obesity 7. A/C renal failure - improving 8. Hypokalemia 9. Hyponatremia - improved 10. Hypomagnesemia 11. Hypertension   Much improved with milrinone and lasix gtt. Weight down 31 pounds. Co-ox improved. Renal function improving. Would continue to diurese with  inotropic support (can decrease milrinone to 0.125).  I pulled PA catheter out this am. Will get PICC in later today and remove IJ sheath as well. Place TED Hose.  Will start back low dose ACE-I. Continue hyrdal/NTG.  Would continue to avoid LHC for now as I suspect cardiomyopathy is non-ischemic and risk of instrumenting aorta at this point is not trivial - can consider down the road as needed. SPEP and UPEP pending. Will need TEE in next few days to look for source of emboli (?LV clot) and also check aorta. cMRI may also be helpful.  VVS following - likely need toe amp on Monday. Continue heparin.   The patient is critically ill with multiple organ systems failure and requires high complexity decision making for assessment and support, frequent evaluation and titration of  therapies, application of advanced monitoring technologies and extensive interpretation of multiple databases.   Critical Care Time devoted to patient care services described in this note is 35 Minutes.     LOS: 7 days    Arvilla Meres 12/10/2012, 7:54 AM

## 2012-12-10 NOTE — Progress Notes (Signed)
Peripherally Inserted Central Catheter/Midline Placement  The IV Nurse has discussed with the patient and/or persons authorized to consent for the patient, the purpose of this procedure and the potential benefits and risks involved with this procedure.  The benefits include less needle sticks, lab draws from the catheter and patient may be discharged home with the catheter.  Risks include, but not limited to, infection, bleeding, blood clot (thrombus formation), and puncture of an artery; nerve damage and irregular heat beat.  Alternatives to this procedure were also discussed.  PICC/Midline Placement Documentation        Jose Petersen 12/10/2012, 2:56 PM

## 2012-12-10 NOTE — Progress Notes (Signed)
ANTICOAGULATION CONSULT NOTE - Follow Up Consult  Pharmacy Consult for heparin Indication: ACS with possible LV emboli  Labs:  Basename 12/10/12 2224 12/10/12 0500 12/09/12 2238 12/09/12 1030 12/09/12 0430 12/08/12 0525  HGB -- 10.5* -- -- 10.6* --  HCT -- 34.4* -- -- 34.4* 37.8*  PLT -- 328 -- -- 341 371  APTT -- -- -- -- -- --  LABPROT -- -- -- -- -- --  INR -- -- -- -- -- --  HEPARINUNFRC 0.31 0.27* -- 0.61 -- --  CREATININE -- 1.25 1.27 -- 1.29 --  CKTOTAL -- -- -- -- -- --  CKMB -- -- -- -- -- --  TROPONINI -- -- -- -- -- --    Assessment: 47 yo male with heparin level (0.31) at lower-end of goal range. Heparin level was ordered as patient with some bleeding around line site a little earlier and wanted to ensure heparin level was not above-goal. Spoke with RN, and at this time the bleeding has now formed a clot.   Goal of Therapy:  Heparin level 0.3-0.7 units/ml   Plan:  1. Continue IV heparin infusion at 1450 units/hr.  2. Daily CBC, heparin level. 3. Continue to monitor for signs / symptoms of bleeding.  4. Recommended to RN that if bleeding restarts, to contact physician.   Emeline Gins PharmD BCPS 12/10/2012,11:16 PM

## 2012-12-11 ENCOUNTER — Inpatient Hospital Stay (HOSPITAL_COMMUNITY): Payer: Self-pay

## 2012-12-11 DIAGNOSIS — I428 Other cardiomyopathies: Secondary | ICD-10-CM

## 2012-12-11 LAB — UIFE/LIGHT CHAINS/TP QN, 24-HR UR
Albumin, U: DETECTED
Beta, Urine: DETECTED — AB
Free Kappa/Lambda Ratio: 6 ratio (ref 2.04–10.37)
Free Lambda Lt Chains,Ur: 0.1 mg/dL (ref 0.02–0.67)

## 2012-12-11 LAB — CBC
HCT: 35.2 % — ABNORMAL LOW (ref 39.0–52.0)
MCH: 22.7 pg — ABNORMAL LOW (ref 26.0–34.0)
MCHC: 30.7 g/dL (ref 30.0–36.0)
MCV: 73.9 fL — ABNORMAL LOW (ref 78.0–100.0)
Platelets: 317 10*3/uL (ref 150–400)
RDW: 18.8 % — ABNORMAL HIGH (ref 11.5–15.5)
WBC: 6.9 10*3/uL (ref 4.0–10.5)

## 2012-12-11 LAB — BASIC METABOLIC PANEL
BUN: 15 mg/dL (ref 6–23)
BUN: 14 mg/dL (ref 6–23)
CO2: 35 mEq/L — ABNORMAL HIGH (ref 19–32)
Calcium: 9.8 mg/dL (ref 8.4–10.5)
Chloride: 90 mEq/L — ABNORMAL LOW (ref 96–112)
Chloride: 89 mEq/L — ABNORMAL LOW (ref 96–112)
Creatinine, Ser: 1.44 mg/dL — ABNORMAL HIGH (ref 0.50–1.35)
GFR calc Af Amer: 63 mL/min — ABNORMAL LOW (ref >90)
GFR calc non Af Amer: 54 mL/min — ABNORMAL LOW (ref >90)
Potassium: 4.2 mEq/L (ref 3.5–5.1)
Sodium: 135 mEq/L (ref 135–145)

## 2012-12-11 LAB — GLUCOSE, CAPILLARY
Glucose-Capillary: 125 mg/dL — ABNORMAL HIGH (ref 70–99)
Glucose-Capillary: 173 mg/dL — ABNORMAL HIGH (ref 70–99)
Glucose-Capillary: 149 mg/dL — ABNORMAL HIGH (ref 70–99)
Glucose-Capillary: 101 mg/dL — ABNORMAL HIGH (ref 70–99)

## 2012-12-11 LAB — PROTEIN ELECTROPH W RFLX QUANT IMMUNOGLOBULINS
Alpha-2-Globulin: 10.8 % (ref 7.1–11.8)
Beta 2: 5.8 % (ref 3.2–6.5)
Beta Globulin: 7.9 % — ABNORMAL HIGH (ref 4.7–7.2)
Gamma Globulin: 14.2 % (ref 11.1–18.8)
M-Spike, %: NOT DETECTED g/dL

## 2012-12-11 LAB — HEPARIN LEVEL (UNFRACTIONATED): Heparin Unfractionated: 0.29 IU/mL — ABNORMAL LOW (ref 0.30–0.70)

## 2012-12-11 LAB — CARBOXYHEMOGLOBIN: O2 Saturation: 78.7 %

## 2012-12-11 MED ORDER — GADOBENATE DIMEGLUMINE 529 MG/ML IV SOLN
40.0000 mL | Freq: Once | INTRAVENOUS | Status: AC
  Administered 2012-12-11: 40 mL via INTRAVENOUS

## 2012-12-11 MED ORDER — CITALOPRAM HYDROBROMIDE 10 MG PO TABS
10.0000 mg | ORAL_TABLET | Freq: Every day | ORAL | Status: DC
  Administered 2012-12-11 – 2012-12-13 (×3): 10 mg via ORAL
  Filled 2012-12-11 (×5): qty 1

## 2012-12-11 NOTE — Progress Notes (Addendum)
Advanced Heart Failure Rounding Note  Subjective:   Jose Petersen is a 47 y/o male with h/o CAD and ischemic CM s/p MI and previous CABG 2007. Last cath 2009 with patent grafts and EF 45%.   Admitted with anasarca and 50lb weight gain. Echo with EF 15% with RV dysfunction. Has had progressive HF symptoms for several months. No angina like he had with MI. Has had sluggish response to diuretics. Course c/b emboli to toes and nephrotic range proteinuria felt secondary to DM2.  Underwent placement of PA cath on 1/7. Started on milrinone and lasix gtt. Diuresed well. Milrinone cut back 0.125 yesterday. Lasix gtt cut back 15->10. Swan pulled yesterday. PICC line placed.  CVP 7.  Co-ox 78.7  Continues to diurese briskly. Weight down 54 pounds. Cr up from 1.25 to 1.44 K+ 4.2  Slept well.  Denies orthopnea/PND.  Feeling better.  Abdomen softer.  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   Objective:  Vital Signs in the last 24 hours: Temp:  [97.3 F (36.3 C)-98.7 F (37.1 C)] 97.3 F (36.3 C) (01/10 0350) Pulse Rate:  [57-94] 72  (01/10 0300) Resp:  [10-21] 12  (01/10 0500) BP: (108-176)/(48-112) 123/63 mmHg (01/10 0500) SpO2:  [84 %-99 %] 96 % (01/10 0350) Weight:  [243 lb 6.2 oz (110.4 kg)] 243 lb 6.2 oz (110.4 kg) (01/10 0500)  Intake/Output from previous day: 01/09 0701 - 01/10 0700 In: 1517.8 [P.O.:720; I.V.:797.8] Out: 8650 [Urine:8650] Intake/Output from this shift:    Physical Exam: Lying in bed. NAD. Swan in RIJ HEENT: Unremarkable Neck:  Supple.  Lungs:  CTA HEART:  Regular rate rhythm, distant, no murmurs, no rubs, no clicks. +s3 Abd:  obese, mildly distended, NT Ext:  2+ edema; R 3/4 right toes blue. PICC RUE Neuro:  CN II through XII intact, motor grossly intact  Lab Results:  Basename 12/11/12 0533 12/10/12 0500  WBC 6.9 7.7  HGB 10.8* 10.5*  PLT 317 328    Basename 12/11/12 0533 12/10/12 0500  NA 135 133*  K 4.2 4.0  CL 90* 93*  CO2 35* 27  GLUCOSE 150* 127*    BUN 15 14  CREATININE 1.44* 1.25    Cardiac Studies: Tele - nsr, PVCs     . aspirin  81 mg Oral Daily  . atorvastatin  10 mg Oral q1800  . carvedilol  3.125 mg Oral BID WC  . colchicine  0.6 mg Oral BID  . ferrous gluconate  324 mg Oral TID WC  . hydrALAZINE  75 mg Oral Q8H  . insulin aspart  0-9 Units Subcutaneous TID WC  . isosorbide mononitrate  30 mg Oral Daily  . lisinopril  10 mg Oral Daily  . morphine  30 mg Oral Q12H  . pantoprazole  40 mg Oral BID  . potassium chloride  20 mEq Oral BID  . sodium chloride  10-40 mL Intracatheter Q12H  . sodium chloride  3 mL Intravenous Q12H  . spironolactone  25 mg Oral BID      . furosemide (LASIX) infusion 10 mg/hr (12/10/12 1701)  . heparin 1,450 Units/hr (12/10/12 2051)  . milrinone 0.125 mcg/kg/min (12/10/12 1257)   Assessment/Plan:  1.Acute biventricular CHF R>L     LVEF 15% 2. 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 H/o Gi bleeding 4. CAD s/p CABG 2007 5. Gout 6. Morbid obesity 7. A/C renal failure - improving 8. Hypokalemia 9. Hyponatremia - improved 10. Hypomagnesemia 11. Hypertension 12. Anxiety  Volume status much improved with  milrinone and lasix gtt, now down 54 pounds.  Co-ox 78.7%.  Renal function up to 1.44 (1.25 yesterday).  Cut back ACE-I 5 mg daily.  Continue hydral/nitrates.    Hold off on LHC at this time as suspect cardiomyopathy is non-ischemic but can consider MRI.  Will need TEE to look for source of emboli and check aorta.    VVS has tentatively scheduled amputation of toes on Monday 1/13. Continue heparin for now.   Renal will follow up in outpatient setting.    Have consulted SW for depression screen.     LOS: 8 days   Jose Petersen, Jose Petersen 12/11/2012, 7:36 AM  Patient seen and examined with Jose Blossom, Jose Petersen. We discussed all aspects of the encounter. I agree with the assessment and plan as stated above.   He continues to improve steadily. Still with significant volume on  board. With renal function trending up will cut rate of diuresis back. Add TED hose. Check BMET later today.  For cMRI today. Will also order ab u/s to look at aorta for aneursym and thrombus.  VVS to remove toes next week.   Will start Celexa for anxiety and possible depression. PT to see.   Jose Caamano,MD 9:49 AM

## 2012-12-11 NOTE — Progress Notes (Signed)
ANTICOAGULATION CONSULT NOTE - Follow Up Consult  Pharmacy Consult for heparin Indication: ACS with possible LV emboli  Labs:  Basename 12/11/12 1430 12/11/12 0533 12/10/12 2224 12/10/12 0500 12/09/12 2238 12/09/12 0430  HGB -- 10.8* -- 10.5* -- --  HCT -- 35.2* -- 34.4* -- 34.4*  PLT -- 317 -- 328 -- 341  APTT -- -- -- -- -- --  LABPROT -- -- -- -- -- --  INR -- -- -- -- -- --  HEPARINUNFRC 0.29* 0.25* 0.31 -- -- --  CREATININE -- 1.44* -- 1.25 1.27 --  CKTOTAL -- -- -- -- -- --  CKMB -- -- -- -- -- --  TROPONINI -- -- -- -- -- --    Assessment: 47 yo male on IV heparin. Heparin level is still slightly below goal = 0.29 after rate increase this morning. No significant bleeding note per RN. Cbc stable.  Goal of Therapy:  Heparin level 0.3-0.7 units/ml   Plan:  1. Increase IV heparin infusion slightly to 1600 units/hr to stay in therapeutic range 2. Next heparin level tomorrow at 5am  Bayard Hugger, PharmD, BCPS  Clinical Pharmacist  Pager: [phone]   12/11/2012,3:43 PM

## 2012-12-11 NOTE — Progress Notes (Signed)
CARDIAC REHAB PHASE I   PRE:  Rate/Rhythm: 78SR  BP:  Supine: 126/75  Sitting:   Standing:    SaO2: 94%RA  MODE:  Ambulation: 270 ft   POST:  Rate/Rhythem: 98SR  BP:  Supine:   Sitting: 138/76             SaO2: 97%RA 1330-1406 Pt willing to walk. Ambulated 270 ft on RA with rolling walker and asst x 2 due to equipment. Pt did not want [place] to hold to him. He did well until end of walk when he stated right foot really started to hurt. To recliner after walk. Denied SOB. Notified pt's RN that pt would like something for pain. Duanne Limerick

## 2012-12-11 NOTE — Clinical Social Work Psychosocial (Signed)
     Clinical Social Work Department BRIEF PSYCHOSOCIAL ASSESSMENT 12/11/2012  Patient:  Jose Petersen, Jose Petersen     Account Number:  [id number]     Admit date:  12/03/2012  Clinical Social Worker:  Hulan Fray  Date/Time:  12/11/2012 03:18 PM  Referred by:  Physician  Date Referred:  12/11/2012 Referred for  Other - See comment   Other Referral:   depression screen   Interview type:  Patient Other interview type:    PSYCHOSOCIAL DATA Living Status:  PARENTS Admitted from facility:   Level of care:   Primary support name:  Leanne Chang Primary support relationship to patient:  PARENT Degree of support available:   supportive    CURRENT CONCERNS Current Concerns  None Noted   Other Concerns:    SOCIAL WORK ASSESSMENT / PLAN Clinical Social Worker received referral for depression screen. CSW introduced self and explained reason for visit. Patient was agreeable for CSW to complete depression screen with him. Patient scored a 13.    Patient denies feeling depressed or having suicidal or homicidal ideation. Patient reported, "I'm too much of a wimp to hurt myself. If I do have feelings like that, I will reach out and get help."    Patient reported that he moved to the area from [place] to help take care of his mother, but the patient reported that his mother is taking care of him. Patient reported that he is concentrated on getting better. CSW offered resources for counseling and mental health services and patient was agreeable to receive them.   Assessment/plan status:  Information/Referral to [facility] Other assessment/ plan:   Information/referral to community resources:   Supportive and [facility]  Outpatient psychiatry and Counseling    PATIENTS/FAMILYS RESPONSE TO PLAN OF CARE: Patient was agreeable to speak with CSW regarding depression consult. Patient reported that he does not feel depressed but was agreeable to receiving the resources  from CSW of counseling. Patient was appreciative of CSW's visit.

## 2012-12-11 NOTE — Progress Notes (Signed)
ANTICOAGULATION CONSULT NOTE - Follow Up Consult  Pharmacy Consult for heparin Indication: ACS with possible LV emboli  Labs:  Basename 12/11/12 0533 12/10/12 2224 12/10/12 0500 12/09/12 2238 12/09/12 0430  HGB 10.8* -- 10.5* -- --  HCT 35.2* -- 34.4* -- 34.4*  PLT 317 -- 328 -- 341  APTT -- -- -- -- --  LABPROT -- -- -- -- --  INR -- -- -- -- --  HEPARINUNFRC 0.25* 0.31 0.27* -- --  CREATININE 1.44* -- 1.25 1.27 --  CKTOTAL -- -- -- -- --  CKMB -- -- -- -- --  TROPONINI -- -- -- -- --    Assessment: 47 yo male on IV heparin. Overnight, heparin level (0.31) was ordered as patient with some bleeding around line site in evening and wanted to ensure heparin level was not above-goal. Spoke with RN, bleeding had formed a clot.   Heparin level (0.25) is now below-goal on 1450 units/hr. No problem with line / infusion and no more bleeding (since clot) per RN.   Goal of Therapy:  Heparin level 0.3-0.7 units/ml   Plan:  1. Increase IV heparin infusion to 1550 units/hr.  2. Heparin level in 6 hours  Emeline Gins PharmD BCPS 12/11/2012,6:32 AM

## 2012-12-11 NOTE — Progress Notes (Signed)
[facility]  Subjective  - No change  Objective 150/80 83 97.5 F (36.4 C) (Oral) 16 100%  Intake/Output Summary (Last 24 hours) at 12/11/12 1118 Last data filed at 12/11/12 1000  Gross per 24 hour  Intake 1414.7 ml  Output   8775 ml  Net -7360.3 ml   Toes right foot unchanged  Assessment/Planning: Amputation toes right foot Monday if stable  Kanyia Heaslip E 12/11/2012 11:18 AM --  Laboratory Lab Results:  Basename 12/11/12 0533 12/10/12 0500  WBC 6.9 7.7  HGB 10.8* 10.5*  HCT 35.2* 34.4*  PLT 317 328   BMET  Basename 12/11/12 0533 12/10/12 0500  NA 135 133*  K 4.2 4.0  CL 90* 93*  CO2 35* 27  GLUCOSE 150* 127*  BUN 15 14  CREATININE 1.44* 1.25  CALCIUM 9.8 9.4    COAG Lab Results  Component Value Date   INR 1.13 08/28/2012   INR 1.09 08/27/2012   No results found for this basename: PTT    Antibiotics Anti-infectives    None

## 2012-12-11 NOTE — Evaluation (Signed)
Physical Therapy Evaluation Patient Details Name: Jose Petersen MRN: [id number] DOB: 1966-08-31 Today's Date: 12/11/2012 Time: 1308-6578 PT Time Calculation (min): 30 min  PT Assessment / Plan / Recommendation Clinical Impression  47 y/o with h/o CAD and ischemic CM s/p MI and previous CABG 2007. Admitted with anasarca and 50 lb weight gain as well as blue toe syndrome. VVS has tentatively scheduled amputation of toes on Monday 1/13. Presents to PT with below problem list affecting functional independence. Will benefit physical therapy in the acute setting to maximize activity tolerance, strength and balance in preparation of future toe amputation so as to decrease recovery time and length of stay.       PT Assessment  Patient needs continued PT services    Follow Up Recommendations  Home health PT;Supervision/Assistance - 24 hour (this may change following surgery)    Does the patient have the potential to tolerate intense rehabilitation      Barriers to Discharge        Equipment Recommendations  None recommended by PT    Recommendations for Other Services     Frequency Min 3X/week    Precautions / Restrictions Precautions Precautions: Fall Restrictions Weight Bearing Restrictions: No   Pertinent Vitals/Pain C/o foot pain 7/10      Mobility  Bed Mobility Bed Mobility: Sit to Supine;Scooting to HOB Sit to Supine: 6: Modified independent (Device/Increase time);HOB elevated (25 degrees) Scooting to HOB: 6: Modified independent (Device/Increase time);With rail Transfers Transfers: Sit to Stand;Stand to Sit Sit to Stand: 5: Supervision;From chair/3-in-1;From toilet;With upper extremity assist Stand to Sit: 5: Supervision;To bed;To toilet;With upper extremity assist Details for Transfer Assistance: assist with coordination of 2 IV poles, cues for safe technique and hand placement Ambulation/Gait Ambulation/Gait Assistance: 4: Min guard;4: Min assist Ambulation Distance  (Feet): 280 Feet Assistive device: None (pushing IV pole) Ambulation/Gait Assistance Details: gaurding and occasional min tactile assist to correct lateral LOB Gait Pattern: Narrow base of support General Gait Details: slow and staggered gait, occasionally crosses midline to correct stagger, mostly staggers to the left (blames his instability on how slow he is walking and doesn't think the therapist needs to be holding onto him)              PT Diagnosis: Difficulty walking;Abnormality of gait;Generalized weakness;Acute pain  PT Problem List: Decreased strength;Decreased activity tolerance;Decreased balance;Pain;Decreased knowledge of use of DME;Obesity PT Treatment Interventions: DME instruction;Gait training;Functional mobility training;Therapeutic activities;Stair training;Therapeutic exercise;Balance training;Neuromuscular re-education;Patient/family education   PT Goals Acute Rehab PT Goals PT Goal Formulation: With patient Time For Goal Achievement: 12/18/12 Potential to Achieve Goals: Good Pt will go Sit to Stand: with modified independence PT Goal: Sit to Stand - Progress: Goal set today Pt will go Stand to Sit: with modified independence PT Goal: Stand to Sit - Progress: Goal set today Pt will Transfer Bed to Chair/Chair to Bed: with modified independence PT Transfer Goal: Bed to Chair/Chair to Bed - Progress: Goal set today Pt will Ambulate: >150 feet;with modified independence;with least restrictive assistive device PT Goal: Ambulate - Progress: Goal set today Pt will Go Up / Down Stairs: 1-2 stairs;with rail(s);with modified independence PT Goal: Up/Down Stairs - Progress: Goal set today Pt will Perform Home Exercise Program: Independently PT Goal: Perform Home Exercise Program - Progress: Goal set today  Visit Information  Last PT Received On: 12/11/12 Assistance Needed: +1    Subjective Data  Subjective: My feet are hurting. Patient Stated Goal: To pick back up  where he left off,  get back to the gym and doing for himself   Prior Functioning  Home Living Lives With: Family Available Help at Discharge: Family;Available 24 hours/day Type of Home: House Home Access: Stairs to enter [employer] of Steps: 2 Home Layout: One level Firefighter: Standard Home Adaptive Equipment: Walker - rolling Prior Function Level of Independence: Independent Able to Take Stairs?: Yes Driving: Yes Communication Communication: No difficulties    Cognition  Overall Cognitive Status: Appears within functional limits for tasks assessed/performed Arousal/Alertness: Awake/alert Orientation Level: Appears intact for tasks assessed Behavior During Session: Flat affect    Extremity/Trunk Assessment Right Upper Extremity Assessment RUE ROM/Strength/Tone: [facility] for tasks assessed Left Upper Extremity Assessment LUE ROM/Strength/Tone: WFL for tasks assessed Right Lower Extremity Assessment RLE ROM/Strength/Tone: [facility] [address] for tasks assessed Left Lower Extremity Assessment LLE ROM/Strength/Tone: WFL for tasks assessed   Balance Balance Balance Assessed: Yes Static Standing Balance Static Standing - Balance Support: No upper extremity supported Static Standing - Level of Assistance: 5: Stand by assistance  End of Session PT - End of Session Equipment Utilized During Treatment: Gait belt Activity Tolerance: Patient tolerated treatment well Patient left: in bed;with call bell/phone within reach Nurse Communication: Mobility status  GP     [name] 12/11/2012, 3:44 PM

## 2012-12-12 ENCOUNTER — Inpatient Hospital Stay (HOSPITAL_COMMUNITY): Payer: Self-pay

## 2012-12-12 LAB — BASIC METABOLIC PANEL
BUN: 16 mg/dL (ref 6–23)
Creatinine, Ser: 1.45 mg/dL — ABNORMAL HIGH (ref 0.50–1.35)
GFR calc non Af Amer: 56 mL/min — ABNORMAL LOW (ref >90)
Glucose, Bld: 155 mg/dL — ABNORMAL HIGH (ref 70–99)
Potassium: 4.3 mEq/L (ref 3.5–5.1)

## 2012-12-12 LAB — CBC
HCT: 36.4 % — ABNORMAL LOW (ref 39.0–52.0)
Hemoglobin: 11.3 g/dL — ABNORMAL LOW (ref 13.0–17.0)
MCH: 22.9 pg — ABNORMAL LOW (ref 26.0–34.0)
MCHC: 31 g/dL (ref 30.0–36.0)
RDW: 19.1 % — ABNORMAL HIGH (ref 11.5–15.5)

## 2012-12-12 LAB — GLUCOSE, CAPILLARY
Glucose-Capillary: 123 mg/dL — ABNORMAL HIGH (ref 70–99)
Glucose-Capillary: 106 mg/dL — ABNORMAL HIGH (ref 70–99)

## 2012-12-12 LAB — CARBOXYHEMOGLOBIN: Methemoglobin: 1 % (ref 0.0–1.5)

## 2012-12-12 LAB — IGG, IGA, IGM
IgA: 372 mg/dL (ref 68–379)
IgG (Immunoglobin G), Serum: 985 mg/dL (ref 650–1600)
IgM, Serum: 237 mg/dL (ref 41–251)

## 2012-12-12 MED ORDER — TORSEMIDE 20 MG PO TABS
20.0000 mg | ORAL_TABLET | Freq: Two times a day (BID) | ORAL | Status: DC
  Administered 2012-12-12 – 2012-12-13 (×3): 20 mg via ORAL
  Filled 2012-12-12 (×5): qty 1

## 2012-12-12 NOTE — Progress Notes (Signed)
ANTICOAGULATION CONSULT NOTE - Follow Up Consult  Pharmacy Consult for heparin Indication: ACS and possible LV emboli  Allergies  Allergen Reactions  . Toradol (Ketorolac Tromethamine) Hives  . Motrin (Ibuprofen) Other (See Comments)    Pt has ulcer    Patient Measurements: Height: 5\' 9"  (175.3 cm) Weight: 238 lb 5.1 oz (108.1 kg) IBW/kg (Calculated) : 70.7  Heparin Dosing Weight: 94.3 kg  Vital Signs: Temp: 98.7 F (37.1 C) (01/11 0721) Temp src: Oral (01/11 0721) BP: 136/84 mmHg (01/11 0800) Pulse Rate: 97  (01/11 0800)  Labs:  Basename 12/12/12 0529 12/11/12 1500 12/11/12 1430 12/11/12 0533 12/10/12 0500  HGB 11.3* -- -- 10.8* --  HCT 36.4* -- -- 35.2* 34.4*  PLT 317 -- -- 317 328  APTT -- -- -- -- --  LABPROT -- -- -- -- --  INR -- -- -- -- --  HEPARINUNFRC 0.34 -- 0.29* 0.25* --  CREATININE 1.45* 1.48* -- 1.44* --  CKTOTAL -- -- -- -- --  CKMB -- -- -- -- --  TROPONINI -- -- -- -- --    Estimated Creatinine Clearance: 75.5 ml/min (by C-G formula based on Cr of 1.45).   Medications:  Scheduled:    . aspirin  81 mg Oral Daily  . atorvastatin  10 mg Oral q1800  . carvedilol  3.125 mg Oral BID WC  . citalopram  10 mg Oral Daily  . colchicine  0.6 mg Oral BID  . ferrous gluconate  324 mg Oral TID WC  . [COMPLETED] gadobenate dimeglumine  40 mL Intravenous Once  . hydrALAZINE  75 mg Oral Q8H  . insulin aspart  0-9 Units Subcutaneous TID WC  . isosorbide mononitrate  30 mg Oral Daily  . lisinopril  10 mg Oral Daily  . morphine  30 mg Oral Q12H  . pantoprazole  40 mg Oral BID  . sodium chloride  10-40 mL Intracatheter Q12H  . sodium chloride  3 mL Intravenous Q12H  . spironolactone  25 mg Oral BID  . torsemide  20 mg Oral BID  . [DISCONTINUED] potassium chloride  20 mEq Oral BID    Assessment: 68 YOM with history of CHF, HTN, HLD and PVD started on heparin after cath. Heparin level this morning was therapeutic at 0.34 units/mL on a heparin drip at 1600  units/hr. CBC is stable and no bleeding is noted. Noted patient to go for amputation of toes on Monday 1/13.   Goal of Therapy:  Heparin level 0.3-0.7 units/ml Monitor platelets by anticoagulation protocol: Yes   Plan:  1. Continue heparin at 1600 units/hr 2. Daily CBC and heparin level 3. Follow up plans heparin with planned amputation  Brevin Mcfadden D. Asalee Barrette, PharmD Clinical Pharmacist Pager: [phone] 12/12/2012 10:24 AM

## 2012-12-12 NOTE — Progress Notes (Signed)
Patient up to restroom and instructed him to please use urinal so that we can keep an accurate intake and output for his records. Patient verbalized understanding. Educated him on why we need to monitor his output to keep track of his fluid status. Provided him privacy and patient began to urinate both in the toilet and on the floor. He stated that he was not able to use the urinal and had sat it down on the floor when he started going. Patient cleaned up, gown changed, and TED hose removed due to urine and new pair ordered. Will educate him more on why we need to monitor his output.

## 2012-12-12 NOTE — Progress Notes (Addendum)
Advanced Heart Failure Rounding Note  Subjective:   Jose Petersen is a 47 y/o male with h/o CAD and ischemic CM s/p MI and previous CABG 2007. Last cath 2009 with patent grafts and EF 45%.   Admitted with anasarca and 50lb weight gain. Echo with EF 15% with RV dysfunction. Has had progressive HF symptoms for several months. No angina like he had with MI. Has had sluggish response to diuretics. Course c/b emboli to toes and nephrotic range proteinuria felt secondary to DM2.  Underwent placement of PA cath on 1/7. Started on milrinone and lasix gtt.   Lasix cut back yesterday as Cr was trending from 1.2 -> 1.4  Cr stable today at 1.4.   I/O down another 6L. Weight down 59 pounds. Cr up from 1.25 to 1.44 K+ 4.2. Co-ox climbing now 89%?  Slept well.  Denies orthopnea/PND.  Feeling better.  Abdomen softer. Did have some nausea last night.  cMRI done yesterday. Results pending. CVP checked personally 5-6.    Objective:  Vital Signs in the last 24 hours: Temp:  [97.4 F (36.3 C)-98.7 F (37.1 C)] 98.7 F (37.1 C) (01/11 0721) Pulse Rate:  [84-86] 84  (01/10 1608) Resp:  [18-20] 20  (01/11 0721) BP: (108-135)/(59-89) 135/84 mmHg (01/11 0401) SpO2:  [90 %-98 %] 98 % (01/11 0721) Weight:  [108.1 kg (238 lb 5.1 oz)] 108.1 kg (238 lb 5.1 oz) (01/11 0500)  Intake/Output from previous day: 01/10 0701 - 01/11 0700 In: 1131.7 [P.O.:360; I.V.:771.7] Out: 7802 [Urine:7802] Intake/Output from this shift:    Physical Exam: Lying in bed. NAD.  HEENT: Unremarkable Neck:  Supple.  Lungs:  CTA HEART:  Regular rate rhythm, distant, no murmurs, no rubs, no clicks.  Abd:  obese, mildly distended, NT Ext: trivial edema; R 3/4 right toes blue. PICC RUE Neuro:  CN II through XII intact, motor grossly intact  Lab Results:  Basename 12/12/12 0529 12/11/12 0533  WBC 6.8 6.9  HGB 11.3* 10.8*  PLT 317 317    Basename 12/12/12 0529 12/11/12 1500  NA 133* 135  K 4.3 4.2  CL 87* 89*  CO2 33* 35*    GLUCOSE 155* 144*  BUN 16 14  CREATININE 1.45* 1.48*    Cardiac Studies: Tele - nsr, PVCs     . aspirin  81 mg Oral Daily  . atorvastatin  10 mg Oral q1800  . carvedilol  3.125 mg Oral BID WC  . citalopram  10 mg Oral Daily  . colchicine  0.6 mg Oral BID  . ferrous gluconate  324 mg Oral TID WC  . hydrALAZINE  75 mg Oral Q8H  . insulin aspart  0-9 Units Subcutaneous TID WC  . isosorbide mononitrate  30 mg Oral Daily  . lisinopril  10 mg Oral Daily  . morphine  30 mg Oral Q12H  . pantoprazole  40 mg Oral BID  . potassium chloride  20 mEq Oral BID  . sodium chloride  10-40 mL Intracatheter Q12H  . sodium chloride  3 mL Intravenous Q12H  . spironolactone  25 mg Oral BID      . furosemide (LASIX) infusion 10 mg/hr (12/11/12 1711)  . heparin 1,600 Units/hr (12/11/12 1547)  . milrinone 0.125 mcg/kg/min (12/10/12 1257)   Assessment:  1.Acute biventricular CHF R>L     LVEF 15% 2. 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 H/o GI bleeding 4. CAD s/p CABG 2007 5. Gout 6. Morbid obesity 7. A/C renal failure 8. Hypokalemia  9. Hyponatremia - improved 10. Hypomagnesemia 11. Hypertension 12. Anxiety  PLAN:  Now euvolemic. Will stop IV lasix and changed to torsemide 20 bid. Continue other HF meds. Await results of cMRI. Will also check ab u/s  to look for source of emboli and check aorta.    Co-ox up but no other signs of infection (no fever or leukocytosis). Low threshold to check cultures.   VVS has tentatively scheduled amputation of toes on Monday 1/13. Continue heparin for now.   Have consulted SW for depression screen.  Celexa started.   Will consult CIR for rehab - will likely need next week after toe amputation.   LOS: 9 days   Truman Hayward 8:31 AM

## 2012-12-12 NOTE — Progress Notes (Signed)
CARDIAC REHAB PHASE I   PRE:  Rate/Rhythm: 87 SR  BP:  Supine: 130/80 Sitting:   Standing:    SaO2: 97% RA  MODE:  Ambulation: 350 ft   POST:  Rate/Rhythem: 97 SR  BP:  Supine: 136/84 Sitting:   Standing:    SaO2: 93% RA  4098-1191 Tolerated ambulation well with assist x 2 and pushing a rolling walker. C/o foot pain, otherwise VSS, to bed after walk.  Jose Petersen

## 2012-12-13 DIAGNOSIS — N189 Chronic kidney disease, unspecified: Secondary | ICD-10-CM

## 2012-12-13 LAB — CARBOXYHEMOGLOBIN
Carboxyhemoglobin: 1.6 % — ABNORMAL HIGH (ref 0.5–1.5)
Carboxyhemoglobin: 1.9 % — ABNORMAL HIGH (ref 0.5–1.5)
Methemoglobin: 1.4 % (ref 0.0–1.5)
Methemoglobin: 1.2 % (ref 0.0–1.5)
O2 Saturation: 48.4 %
O2 Saturation: 65.2 %
Total hemoglobin: 11.9 g/dL — ABNORMAL LOW (ref 13.5–18.0)
Total hemoglobin: 11.1 g/dL — ABNORMAL LOW (ref 13.5–18.0)

## 2012-12-13 LAB — CBC
HCT: 37.3 % — ABNORMAL LOW (ref 39.0–52.0)
Hemoglobin: 11.8 g/dL — ABNORMAL LOW (ref 13.0–17.0)
MCH: 23.2 pg — ABNORMAL LOW (ref 26.0–34.0)
MCHC: 31.6 g/dL (ref 30.0–36.0)
MCV: 73.3 fL — ABNORMAL LOW (ref 78.0–100.0)
Platelets: 307 10*3/uL (ref 150–400)
RBC: 5.09 MIL/uL (ref 4.22–5.81)
RDW: 19.3 % — ABNORMAL HIGH (ref 11.5–15.5)
WBC: 12.1 10*3/uL — ABNORMAL HIGH (ref 4.0–10.5)

## 2012-12-13 LAB — GLUCOSE, CAPILLARY
Glucose-Capillary: 153 mg/dL — ABNORMAL HIGH (ref 70–99)
Glucose-Capillary: 123 mg/dL — ABNORMAL HIGH (ref 70–99)
Glucose-Capillary: 116 mg/dL — ABNORMAL HIGH (ref 70–99)
Glucose-Capillary: 129 mg/dL — ABNORMAL HIGH (ref 70–99)

## 2012-12-13 LAB — BASIC METABOLIC PANEL
BUN: 17 mg/dL (ref 6–23)
Creatinine, Ser: 1.74 mg/dL — ABNORMAL HIGH (ref 0.50–1.35)
GFR calc Af Amer: 52 mL/min — ABNORMAL LOW (ref >90)
GFR calc non Af Amer: 45 mL/min — ABNORMAL LOW (ref >90)
Glucose, Bld: 119 mg/dL — ABNORMAL HIGH (ref 70–99)

## 2012-12-13 LAB — HEPARIN LEVEL (UNFRACTIONATED): Heparin Unfractionated: 0.35 IU/mL (ref 0.30–0.70)

## 2012-12-13 NOTE — Progress Notes (Signed)
ANTICOAGULATION CONSULT NOTE - Follow Up Consult  Pharmacy Consult for heparin Indication: ACS and possible LV emboli  Allergies  Allergen Reactions  . Toradol (Ketorolac Tromethamine) Hives  . Motrin (Ibuprofen) Other (See Comments)    Pt has ulcer    Patient Measurements: Height: 5\' 9"  (175.3 cm) Weight: 226 lb 13.7 oz (102.9 kg) IBW/kg (Calculated) : 70.7  Heparin Dosing Weight: 94.3 kg  Vital Signs: Temp: 97.5 F (36.4 C) (01/12 0400) Temp src: Oral (01/12 0400) BP: 137/83 mmHg (01/12 0632) Pulse Rate: 75  (01/12 0400)  Labs:  Basename 12/13/12 0430 12/12/12 0529 12/11/12 1500 12/11/12 1430 12/11/12 0533  HGB 11.8* 11.3* -- -- --  HCT 37.3* 36.4* -- -- 35.2*  PLT 307 317 -- -- 317  APTT -- -- -- -- --  LABPROT -- -- -- -- --  INR -- -- -- -- --  HEPARINUNFRC 0.35 0.34 -- 0.29* --  CREATININE 1.74* 1.45* 1.48* -- --  CKTOTAL -- -- -- -- --  CKMB -- -- -- -- --  TROPONINI -- -- -- -- --    Estimated Creatinine Clearance: 61.4 ml/min (by C-G formula based on Cr of 1.74).   Medications:  Scheduled:     . aspirin  81 mg Oral Daily  . atorvastatin  10 mg Oral q1800  . carvedilol  3.125 mg Oral BID WC  . citalopram  10 mg Oral Daily  . colchicine  0.6 mg Oral BID  . ferrous gluconate  324 mg Oral TID WC  . hydrALAZINE  75 mg Oral Q8H  . insulin aspart  0-9 Units Subcutaneous TID WC  . isosorbide mononitrate  30 mg Oral Daily  . lisinopril  10 mg Oral Daily  . morphine  30 mg Oral Q12H  . pantoprazole  40 mg Oral BID  . sodium chloride  10-40 mL Intracatheter Q12H  . sodium chloride  3 mL Intravenous Q12H  . spironolactone  25 mg Oral BID  . torsemide  20 mg Oral BID  . [DISCONTINUED] potassium chloride  20 mEq Oral BID    Assessment: 56 YOM with history of CHF, HTN, HLD and PVD started on heparin after cath. Heparin level this morning was therapeutic at 0.35 units/mL on a heparin drip at 1600 units/hr. CBC is stable and no bleeding is noted. Noted  patient to go for amputation of toes on Monday 1/13.   Goal of Therapy:  Heparin level 0.3-0.7 units/ml Monitor platelets by anticoagulation protocol: Yes   Plan:  1. Continue heparin at 1600 units/hr 2. Daily CBC and heparin level 3. Follow up plans heparin with planned amputation  Franchot Erichsen, Pharm.D. Clinical Pharmacist   Pager: [phone] 12/13/2012 7:26 AM

## 2012-12-13 NOTE — Progress Notes (Addendum)
Advanced Heart Failure Rounding Note  Subjective:   Jose Petersen is a 47 y/o male with h/o CAD and ischemic CM s/p MI and previous CABG 2007. Last cath 2009 with patent grafts and EF 45%.   Admitted with anasarca and 50lb weight gain. Echo with EF 15% with RV dysfunction. Has had progressive HF symptoms for several months. No angina like he had with MI. Has had sluggish response to diuretics. Course c/b emboli to toes and nephrotic range proteinuria felt secondary to DM2.  Underwent placement of PA cath on 1/7. Started on milrinone and lasix gtt. Diuresed 71 pounds.  Milrinone and IV lasix stopped yesterday due to rising Cr. I/Os even but weight recorded as down another 10 pounds. Co-ox this am way down at 48% (? Accurate). Cr continues to trend up 1.2->1.4->1.7  cMRI with EF back up to 41%. Probable old MI with other areas of patchy uptake suggesting possible myocarditis (vs infiltrative CM). Ab u/s: reports dimunitive aorta (not well visualized - no AAA)   Feels better. No sob, orthopnea, PND or orthostasis. CVP checked personally = 8.   Objective:  Vital Signs in the last 24 hours: Temp:  [97.4 F (36.3 C)-99.1 F (37.3 C)] 97.6 F (36.4 C) (01/12 0732) Pulse Rate:  [75-90] 77  (01/12 0731) Resp:  [17-18] 18  (01/12 0732) BP: (103-137)/(64-83) 132/79 mmHg (01/12 0731) SpO2:  [94 %-97 %] 96 % (01/12 0732) Weight:  [102.9 kg (226 lb 13.7 oz)] 102.9 kg (226 lb 13.7 oz) (01/12 0500)  Intake/Output from previous day: 01/11 0701 - 01/12 0700 In: 1968.3 [P.O.:1400; I.V.:568.3] Out: 1800 [Urine:1800] Intake/Output from this shift: Total I/O In: 21 [I.V.:21] Out: 250 [Urine:250]  Physical Exam: Lying in bed. NAD.  HEENT: Unremarkable Neck:  Supple.  Lungs:  CTA HEART:  Regular rate rhythm, distant, no murmurs, no rubs, no clicks.  Abd:  obese, mildly distended, NT Ext: trivial edema; R 3/4 right toes blue. PICC RUE Neuro:  CN II through XII intact, motor grossly intact  Lab  Results:  Basename 12/13/12 0430 12/12/12 0529  WBC 12.1* 6.8  HGB 11.8* 11.3*  PLT 307 317    Basename 12/13/12 0430 12/12/12 0529  NA 130* 133*  K 4.0 4.3  CL 84* 87*  CO2 33* 33*  GLUCOSE 119* 155*  BUN 17 16  CREATININE 1.74* 1.45*    Cardiac Studies: Tele - nsr, PVCs     . aspirin  81 mg Oral Daily  . atorvastatin  10 mg Oral q1800  . carvedilol  3.125 mg Oral BID WC  . citalopram  10 mg Oral Daily  . colchicine  0.6 mg Oral BID  . ferrous gluconate  324 mg Oral TID WC  . hydrALAZINE  75 mg Oral Q8H  . insulin aspart  0-9 Units Subcutaneous TID WC  . isosorbide mononitrate  30 mg Oral Daily  . lisinopril  10 mg Oral Daily  . morphine  30 mg Oral Q12H  . pantoprazole  40 mg Oral BID  . sodium chloride  10-40 mL Intracatheter Q12H  . sodium chloride  3 mL Intravenous Q12H  . spironolactone  25 mg Oral BID  . torsemide  20 mg Oral BID      . heparin 1,600 Units/hr (12/13/12 0700)   Assessment:  1.Acute biventricular CHF R>L     LVEF 15% 2. 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 H/o GI bleeding 4. CAD s/p CABG 2007 5. Gout 6. Morbid obesity 7.  A/C renal failure 8. Hypokalemia 9. Hyponatremia - improved 10. Hypomagnesemia 11. Hypertension 12. Anxiety  PLAN:  Doing well. Volume status much improved. EF now close to baseline. However Cr now climbing so will stop diuretics and hold lisinopril. Liberalize po. Dropping co-ox is concerning - but I am not sure if accurate. Will repeat. If < 55% may need to restart milrinone.   Urine IFE shows (looking for amyloid) : Area of slightly restricted mobility in the IgG and kappa lanes. Suggest repeat in 6-8 months, if clinically indicated.  WBC up slightly but no fever or signs of focal infection. Low threshold to culture if continue to increase or has fever.  VVS has tentatively scheduled amputation of toes for tomorrow. I am OK with this as long as co-ox and renal function are stable. Continue heparin  for now.   Have consulted SW for depression screen.  Celexa started.   Will consult CIR for rehab - will likely need next week after toe amputation.   LOS: 10 days   Truman Hayward 8:42 AM

## 2012-12-13 NOTE — Progress Notes (Signed)
Patient ID: Jose Petersen, male   DOB: 04/28/1966, 46 y.o.   MRN: [id number] Vascular Surgery Progress Note  Subjective: Patient sent for agitation right third and fourth toes tomorrow per Dr. fields  Objective:  Filed Vitals:   12/13/12 0732  BP:   Pulse:   Temp: 97.6 F (36.4 C)  Resp: 18      Labs:  Lab 12/13/12 0430 12/12/12 0529 12/11/12 1500  CREATININE 1.74* 1.45* 1.48*    Lab 12/13/12 0430 12/12/12 0529 12/11/12 1500  NA 130* 133* 135  K 4.0 4.3 4.2  CL 84* 87* 89*  CO2 33* 33* 35*  BUN 17 16 14  CREATININE 1.74* 1.45* 1.48*  LABGLOM -- -- --  GLUCOSE 119* -- --  CALCIUM 10.2 10.3 10.0    Lab 12/13/12 0430 12/12/12 0529 12/11/12 0533  WBC 12.1* 6.8 6.9  HGB 11.8* 11.3* 10.8*  HCT 37.3* 36.4* 35.2*  PLT 307 317 317   No results found for this basename: INR:3 in the last 168 hours  I/O last 3 completed shifts: In: 2398.1 [P.O.:1400; I.V.:998.1] Out: 6301 [Urine:6301]  Imaging: Us Aorta  12/12/2012  *RADIOLOGY REPORT*  Clinical Data:  46-year-old male with lower extremity ischemia, blue toes.  ULTRASOUND OF ABDOMINAL AORTA  Technique:  Ultrasound examination of the abdominal aorta was performed to evaluate for abdominal aortic aneurysm.  Comparison: Renal ultrasound 12/04/2012.  Findings:  Limited study due to body habitus and bowel gas.  Abdominal Aorta:  The abdominal aorta is difficult to visualize and appears diminutive, approximate diameter 14-17 mm.  The distal aorta is more difficult to visualize.  The bifurcation area is poorly visible.  The maximum visualized diameter the proximal aorta is 20 mm.  IMPRESSION: Limited exam, and overlying bowel gas might explain the difficulty visualizing the abdominal aorta, but the diminutive appearance of the abdominal aorta and might also reflect an upstream hemodynamically significant stenosis.  No aortic aneurysm visible. Maximum visualized diameter is 20 mm, with the mid abdominal aorta approximating 14 mm.    Original Report Authenticated By: H. Hall III, M.D.    Mr Card Morphology Wo/w Cm  12/12/2012  *RADIOLOGY REPORT*  Clinical Data: Cardiomyopathy  MR CARDIA MORPHOLOGY WITHOUT AND WITH CONTRAST  GE 1.5 T magnet with dedicated cardiac coil.  FIESTA sequences for function and morphology.  10 minutes after 40 mL Multihance contrast was injected, inversion recovery sequences were done to assess for myocardial delayed enhancement.  EF was calculated at a dedicated workstation.  Contrast: 40mL MULTIHANCE GADOBENATE DIMEGLUMINE 529 MG/ML IV SOLN  Comparison: None.  Findings: Technically difficult study as patient had difficulty with breath-holding.  There was a small left pleural effusion.  The left ventricle was mildly dilated with mild to moderately decreased systolic function, EF 41%.  There was global hypokinesis.  There was borderline concentric LV hypertrophy.  There was no LV apical thrombus. The right ventricle was mildly dilated with mildly decreased systolic function.  There was probably mild tricuspid regurgitation.  The aortic valve was trileaflet without significant stenosis or regurgitation.  There did not appear to be significant mitral regurgitation.  There was moderate biatrial enlargement.  Delayed enhancement images were degraded by respiratory artifact. There was patchy enhancement in the basal inferoseptal wall.  There was patchy, primarily subendocardial enhancement in the basal anterior, anterolateral, and inferolateral walls.  There was primarily mid-wall enhancement in the mid anterolateral wall.  Measurements:  LV EDV 260 mL  LV SV 111 mL  LV EF 41%    IMPRESSION: 1. Mildly dilated LV with mild to moderate global hypokinesis, EF 41%.  This is improved from prior echo.  2. Mildly dilated RV with mildly decreased systolic function.  3. Patchy delayed enhancement pattern as described above.  This is not a typical coronary disease pattern but cannot rule out fully as there is a subendocardial  component.  Would consider acute myocariditis or possibly infiltrative disease in the differential.   Original Report Authenticated By: Dalton Mclean     Assessment/Plan:    LOS: 10 days  s/p Procedure(s): RIGHT HEART CATH  Patient preop for right third and fourth toe amputations for Monday per Dr. fields Discussed with patient and he is in agreement and desires to proceed   Jose Capek, MD 12/13/2012 8:33 AM           

## 2012-12-14 ENCOUNTER — Inpatient Hospital Stay (HOSPITAL_COMMUNITY): Payer: Self-pay | Admitting: Anesthesiology

## 2012-12-14 ENCOUNTER — Encounter (HOSPITAL_COMMUNITY): Payer: Self-pay | Admitting: Anesthesiology

## 2012-12-14 ENCOUNTER — Encounter (HOSPITAL_COMMUNITY)
Admission: EM | Disposition: A | Discharge status: Home / Self Care | Payer: Self-pay | Source: Home / Self Care | Attending: Internal Medicine

## 2012-12-14 ENCOUNTER — Other Ambulatory Visit: Payer: Self-pay | Admitting: *Deleted

## 2012-12-14 DIAGNOSIS — I75029 Atheroembolism of unspecified lower extremity: Secondary | ICD-10-CM

## 2012-12-14 DIAGNOSIS — I739 Peripheral vascular disease, unspecified: Secondary | ICD-10-CM

## 2012-12-14 HISTORY — PX: AMPUTATION: SHX166

## 2012-12-14 LAB — BASIC METABOLIC PANEL
BUN: 20 mg/dL (ref 6–23)
CO2: 30 mEq/L (ref 19–32)
GFR calc non Af Amer: 48 mL/min — ABNORMAL LOW (ref >90)
Glucose, Bld: 107 mg/dL — ABNORMAL HIGH (ref 70–99)
Potassium: 3.9 mEq/L (ref 3.5–5.1)
Sodium: 132 mEq/L — ABNORMAL LOW (ref 135–145)

## 2012-12-14 LAB — CBC
HCT: 35.5 % — ABNORMAL LOW (ref 39.0–52.0)
Hemoglobin: 11 g/dL — ABNORMAL LOW (ref 13.0–17.0)
MCH: 22.7 pg — ABNORMAL LOW (ref 26.0–34.0)
MCHC: 31 g/dL (ref 30.0–36.0)
MCV: 73.2 fL — ABNORMAL LOW (ref 78.0–100.0)
RBC: 4.85 MIL/uL (ref 4.22–5.81)

## 2012-12-14 LAB — GLUCOSE, CAPILLARY
Glucose-Capillary: 132 mg/dL — ABNORMAL HIGH (ref 70–99)
Glucose-Capillary: 132 mg/dL — ABNORMAL HIGH (ref 70–99)

## 2012-12-14 LAB — CARBOXYHEMOGLOBIN
Carboxyhemoglobin: 1.4 % (ref 0.5–1.5)
Methemoglobin: 1.4 % (ref 0.0–1.5)

## 2012-12-14 SURGERY — AMPUTATION DIGIT
Anesthesia: General | Site: Toe | Laterality: Right | Wound class: Dirty or Infected

## 2012-12-14 MED ORDER — FENTANYL CITRATE 0.05 MG/ML IJ SOLN: INTRAMUSCULAR | Status: DC | PRN

## 2012-12-14 MED ORDER — PROPOFOL 10 MG/ML IV BOLUS
INTRAVENOUS | Status: DC | PRN
  Administered 2012-12-14: 200 mg via INTRAVENOUS

## 2012-12-14 MED ORDER — 0.9 % SODIUM CHLORIDE (POUR BTL) OPTIME
TOPICAL | Status: DC | PRN
  Administered 2012-12-14: 1000 mL

## 2012-12-14 MED ORDER — EPHEDRINE SULFATE 50 MG/ML IJ SOLN
INTRAMUSCULAR | Status: DC | PRN
  Administered 2012-12-14: 15 mg via INTRAVENOUS

## 2012-12-14 MED ORDER — HEPARIN SODIUM (PORCINE) 5000 UNIT/ML IJ SOLN
5000.0000 [IU] | Freq: Three times a day (TID) | INTRAMUSCULAR | Status: DC
  Administered 2012-12-15 – 2012-12-18 (×10): 5000 [IU] via SUBCUTANEOUS
  Filled 2012-12-14 (×13): qty 1

## 2012-12-14 MED ORDER — HYDROMORPHONE HCL PF 1 MG/ML IJ SOLN
0.2500 mg | INTRAMUSCULAR | Status: DC | PRN
  Administered 2012-12-14 (×4): 0.5 mg via INTRAVENOUS

## 2012-12-14 MED ORDER — CEFAZOLIN SODIUM 1-5 GM-% IV SOLN
INTRAVENOUS | Status: DC | PRN
  Administered 2012-12-14: 1 g via INTRAVENOUS

## 2012-12-14 MED ORDER — PHENYLEPHRINE HCL 10 MG/ML IJ SOLN
INTRAMUSCULAR | Status: DC | PRN
  Administered 2012-12-14 (×2): 80 ug via INTRAVENOUS

## 2012-12-14 MED ORDER — FENTANYL CITRATE 0.05 MG/ML IJ SOLN: 50.0000 ug | INTRAMUSCULAR | Status: DC | PRN

## 2012-12-14 MED ORDER — LIDOCAINE HCL (CARDIAC) 20 MG/ML IV SOLN
INTRAVENOUS | Status: DC | PRN
  Administered 2012-12-14: 100 mg via INTRAVENOUS

## 2012-12-14 MED ORDER — CEFAZOLIN SODIUM 1-5 GM-% IV SOLN
INTRAVENOUS | Status: AC
  Filled 2012-12-14: qty 50

## 2012-12-14 MED ORDER — HYDROMORPHONE HCL PF 1 MG/ML IJ SOLN
INTRAMUSCULAR | Status: AC
  Filled 2012-12-14: qty 1

## 2012-12-14 MED ORDER — LACTATED RINGERS IV SOLN
INTRAVENOUS | Status: DC | PRN
  Administered 2012-12-14: 10:00:00 via INTRAVENOUS

## 2012-12-14 MED ORDER — FENTANYL CITRATE 0.05 MG/ML IJ SOLN
INTRAMUSCULAR | Status: DC | PRN
  Administered 2012-12-14: 50 ug via INTRAVENOUS

## 2012-12-14 SURGICAL SUPPLY — 31 items
BANDAGE ELASTIC 4 VELCRO ST LF (GAUZE/BANDAGES/DRESSINGS) ×4 IMPLANT
BANDAGE GAUZE ELAST BULKY 4 IN (GAUZE/BANDAGES/DRESSINGS) ×2 IMPLANT
CANISTER SUCTION 2500CC (MISCELLANEOUS) ×2 IMPLANT
CLOTH BEACON ORANGE TIMEOUT ST (SAFETY) ×2 IMPLANT
CONT SPECI 4OZ STER CLIK (MISCELLANEOUS) ×2 IMPLANT
COVER SURGICAL LIGHT HANDLE (MISCELLANEOUS) ×2 IMPLANT
DRAPE EXTREMITY T 121X128X90 (DRAPE) ×2 IMPLANT
DRSG EMULSION OIL 3X3 NADH (GAUZE/BANDAGES/DRESSINGS) ×2 IMPLANT
ELECT REM PT RETURN 9FT ADLT (ELECTROSURGICAL) ×2
ELECTRODE REM PT RTRN 9FT ADLT (ELECTROSURGICAL) ×1 IMPLANT
GLOVE BIO SURGEON STRL SZ7.5 (GLOVE) ×2 IMPLANT
GLOVE BIOGEL PI IND STRL 6.5 (GLOVE) ×1 IMPLANT
GLOVE BIOGEL PI INDICATOR 6.5 (GLOVE) ×1
GOWN PREVENTION PLUS XLARGE (GOWN DISPOSABLE) ×4 IMPLANT
GOWN STRL NON-REIN LRG LVL3 (GOWN DISPOSABLE) IMPLANT
KIT BASIN OR (CUSTOM PROCEDURE TRAY) ×2 IMPLANT
KIT ROOM TURNOVER OR (KITS) ×2 IMPLANT
NS IRRIG 1000ML POUR BTL (IV SOLUTION) ×2 IMPLANT
PACK GENERAL/GYN (CUSTOM PROCEDURE TRAY) ×2 IMPLANT
PAD ARMBOARD 7.5X6 YLW CONV (MISCELLANEOUS) ×4 IMPLANT
SPECIMEN JAR SMALL (MISCELLANEOUS) ×2 IMPLANT
SPONGE GAUZE 4X4 12PLY (GAUZE/BANDAGES/DRESSINGS) ×2 IMPLANT
SUT ETHILON 3 0 PS 1 (SUTURE) ×4 IMPLANT
SUT VIC AB 3-0 SH 27 (SUTURE)
SUT VIC AB 3-0 SH 27X BRD (SUTURE) IMPLANT
SWAB COLLECTION DEVICE MRSA (MISCELLANEOUS) IMPLANT
TOWEL OR 17X24 6PK STRL BLUE (TOWEL DISPOSABLE) ×2 IMPLANT
TOWEL OR 17X26 10 PK STRL BLUE (TOWEL DISPOSABLE) ×2 IMPLANT
TUBE ANAEROBIC SPECIMEN COL (MISCELLANEOUS) IMPLANT
UNDERPAD 30X30 INCONTINENT (UNDERPADS AND DIAPERS) ×2 IMPLANT
WATER STERILE IRR 1000ML POUR (IV SOLUTION) ×2 IMPLANT

## 2012-12-14 NOTE — H&P (View-Only) (Signed)
Patient ID: Jose Petersen, male   DOB: May 05, 1966, 47 y.o.   MRN: [id number] Vascular Surgery Progress Note  Subjective: Patient sent for agitation right third and fourth toes tomorrow per Dr. Darrick Penna  Objective:  Filed Vitals:   12/13/12 0732  BP:   Pulse:   Temp: 97.6 F (36.4 C)  Resp: 18      Labs:  Lab 12/13/12 0430 12/12/12 0529 12/11/12 1500  CREATININE 1.74* 1.45* 1.48*    Lab 12/13/12 0430 12/12/12 0529 12/11/12 1500  NA 130* 133* 135  K 4.0 4.3 4.2  CL 84* 87* 89*  CO2 33* 33* 35*  BUN 17 16 14   CREATININE 1.74* 1.45* 1.48*  LABGLOM -- -- --  GLUCOSE 119* -- --  CALCIUM 10.2 10.3 10.0    Lab 12/13/12 0430 12/12/12 0529 12/11/12 0533  WBC 12.1* 6.8 6.9  HGB 11.8* 11.3* 10.8*  HCT 37.3* 36.4* 35.2*  PLT 307 317 317   No results found for this basename: INR:3 in the last 168 hours  I/O last 3 completed shifts: In: 2398.1 [P.O.:1400; I.V.:998.1] Out: 6301 [Urine:6301]  Imaging: US Aorta  12/12/2012  *RADIOLOGY REPORT*  Clinical Data:  47 year old male with lower extremity ischemia, blue toes.  ULTRASOUND OF ABDOMINAL AORTA  Technique:  Ultrasound examination of the abdominal aorta was performed to evaluate for abdominal aortic aneurysm.  Comparison: Renal ultrasound 12/04/2012.  Findings:  Limited study due to body habitus and bowel gas.  Abdominal Aorta:  The abdominal aorta is difficult to visualize and appears diminutive, approximate diameter 14-17 mm.  The distal aorta is more difficult to visualize.  The bifurcation area is poorly visible.  The maximum visualized diameter the proximal aorta is 20 mm.  IMPRESSION: Limited exam, and overlying bowel gas might explain the difficulty visualizing the abdominal aorta, but the diminutive appearance of the abdominal aorta and might also reflect an upstream hemodynamically significant stenosis.  No aortic aneurysm visible. Maximum visualized diameter is 20 mm, with the mid abdominal aorta approximating 14 mm.    Original Report Authenticated By: Erskine Speed, M.D.    Mr Card Morphology Wo/w Cm  12/12/2012  *RADIOLOGY REPORT*  Clinical Data: Cardiomyopathy  MR CARDIA MORPHOLOGY WITHOUT AND WITH CONTRAST  GE 1.5 T magnet with dedicated cardiac coil.  FIESTA sequences for function and morphology.  10 minutes after 40 mL Multihance contrast was injected, inversion recovery sequences were done to assess for myocardial delayed enhancement.  EF was calculated at a dedicated workstation.  Contrast: 40mL MULTIHANCE GADOBENATE DIMEGLUMINE 529 MG/ML IV SOLN  Comparison: None.  Findings: Technically difficult study as patient had difficulty with breath-holding.  There was a small left pleural effusion.  The left ventricle was mildly dilated with mild to moderately decreased systolic function, EF 41%.  There was global hypokinesis.  There was borderline concentric LV hypertrophy.  There was no LV apical thrombus. The right ventricle was mildly dilated with mildly decreased systolic function.  There was probably mild tricuspid regurgitation.  The aortic valve was trileaflet without significant stenosis or regurgitation.  There did not appear to be significant mitral regurgitation.  There was moderate biatrial enlargement.  Delayed enhancement images were degraded by respiratory artifact. There was patchy enhancement in the basal inferoseptal wall.  There was patchy, primarily subendocardial enhancement in the basal anterior, anterolateral, and inferolateral walls.  There was primarily mid-wall enhancement in the mid anterolateral wall.  Measurements:  LV EDV 260 mL  LV SV 111 mL  LV EF 41%  IMPRESSION: 1. Mildly dilated LV with mild to moderate global hypokinesis, EF 41%.  This is improved from prior echo.  2. Mildly dilated RV with mildly decreased systolic function.  3. Patchy delayed enhancement pattern as described above.  This is not a typical coronary disease pattern but cannot rule out fully as there is a subendocardial  component.  Would consider acute myocariditis or possibly infiltrative disease in the differential.   Original Report Authenticated By: Marca Ancona     Assessment/Plan:    LOS: 10 days  s/p Procedure(s): RIGHT HEART CATH  Patient preop for right third and fourth toe amputations for Monday per Dr. Darrick Penna Discussed with patient and he is in agreement and desires to proceed   Josephina Gip, MD 12/13/2012 8:33 AM

## 2012-12-14 NOTE — Anesthesia Postprocedure Evaluation (Signed)
  Anesthesia Post-op Note  Patient: Jose Petersen  Procedure(s) Performed: Procedure(s) (LRB) with comments: AMPUTATION DIGIT (Right) - Amputation of 3rd & 4th toes of right foot   Patient Location: PACU  Anesthesia Type:General  Level of Consciousness: awake  Airway and Oxygen Therapy: Patient Spontanous Breathing  Post-op Pain: mild  Post-op Assessment: Post-op Vital signs reviewed  Post-op Vital Signs: Reviewed  Complications: No apparent anesthesia complications

## 2012-12-14 NOTE — Preoperative (Signed)
Beta Blockers   Reason not to administer Beta Blockers:Not Applicable 

## 2012-12-14 NOTE — Anesthesia Preprocedure Evaluation (Signed)
Anesthesia Evaluation  Patient identified by MRN, date of birth, ID band Patient awake    Airway Mallampati: II      Dental   Pulmonary shortness of breath,          Cardiovascular hypertension, + CAD, + Past MI and +CHF     Neuro/Psych    GI/Hepatic   Endo/Other  Type 2  Renal/GU CRF     Musculoskeletal   Abdominal   Peds  Hematology   Anesthesia Other Findings   Reproductive/Obstetrics                           Anesthesia Physical Anesthesia Plan  ASA: IV  Anesthesia Plan:    Post-op Pain Management:    Induction:   Airway Management Planned:   Additional Equipment:   Intra-op Plan:   Post-operative Plan:   Informed Consent:   Plan Discussed with:   Anesthesia Plan Comments:         Anesthesia Quick Evaluation

## 2012-12-14 NOTE — Progress Notes (Signed)
Dr Randa Evens called for sign out. She said ok to take patient to room.

## 2012-12-14 NOTE — Interval H&P Note (Signed)
History and Physical Interval Note:  12/14/2012 9:26 AM  Jose Petersen  has presented today for surgery, with the diagnosis of Ischemia of toes 3 & 4 right foot  The various methods of treatment have been discussed with the patient and family. After consideration of risks, benefits and other options for treatment, the patient has consented to  Procedure(s) (LRB) with comments: AMPUTATION DIGIT (Right) - Amputation of 3rd & 4th toes of right foot  as a surgical intervention .  The patient's history has been reviewed, patient examined, no change in status, stable for surgery.  I have reviewed the patient's chart and labs.  Questions were answered to the patient's satisfaction.     Elvis Laufer E

## 2012-12-14 NOTE — Progress Notes (Signed)
OT Note:  Pt off floor at a procedure. Will check back tomorrow. Thank you.  Glendale Chard, OTR/L Pager: [phone] 12/14/2012

## 2012-12-14 NOTE — Progress Notes (Signed)
Advanced Heart Failure Rounding Note  Subjective:   Jose Petersen is a 47 y/o male with h/o CAD and ischemic CM s/p MI and previous CABG 2007. Last cath 2009 with patent grafts and EF 45%.   Admitted with anasarca and 50lb weight gain. Echo with EF 15% with RV dysfunction. Has had progressive HF symptoms for several months. No angina like he had with MI. Has had sluggish response to diuretics. Course c/b emboli to toes and nephrotic range proteinuria felt secondary to DM2.  Underwent placement of PA cath on 1/7. Started on milrinone and lasix gtt. Diuresed 71 pounds.  Milrinone and IV lasix stopped 12/13/12.OVerall weight down 71 pounds. Co-ox 62.3.  Cr trend  1.2->1.4->1.7>1.65  cMRI with EF back up to 41%. Probable old MI with other areas of patchy uptake suggesting possible myocarditis (vs infiltrative CM). 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    Objective:  Vital Signs in the last 24 hours: Temp:  [97.3 F (36.3 C)-98.5 F (36.9 C)] 98.5 F (36.9 C) (01/13 0313) Pulse Rate:  [69-71] 69  (01/12 1538) Resp:  [18] 18  (01/12 1538) BP: (105-127)/(58-77) 126/66 mmHg (01/13 0624) SpO2:  [95 %-99 %] 99 % (01/13 0313) Weight:  [226 lb 3.1 oz (102.6 kg)-226 lb 12.8 oz (102.876 kg)] 226 lb 3.1 oz (102.6 kg) (01/13 0313)  Intake/Output from previous day: 01/12 0701 - 01/13 0700 In: 1744 [P.O.:1240; I.V.:504] Out: 1830 [Urine:1830] Intake/Output from this shift:    Physical Exam: Lying in bed. NAD.  HEENT: Unremarkable Neck:  Supple.  Lungs:  CTA HEART:  Regular rate rhythm, distant, no murmurs, no rubs, no clicks.  Abd:  obese, mildly distended, NT Ext: trivial edema; R 3/4 right toes blue. PICC RUE Neuro:  CN II through XII intact, motor grossly intact  Lab Results:  Basename 12/14/12 0400 12/13/12 0430  WBC 6.4 12.1*  HGB 11.0* 11.8*  PLT 283 307    Basename 12/14/12 0400 12/13/12 0430  NA 132* 130*  K 3.9 4.0    CL 90* 84*  CO2 30 33*  GLUCOSE 107* 119*  BUN 20 17  CREATININE 1.65* 1.74*    Cardiac Studies: Tele - nsr, PVCs     . aspirin  81 mg Oral Daily  . atorvastatin  10 mg Oral q1800  . carvedilol  3.125 mg Oral BID WC  . citalopram  10 mg Oral Daily  . colchicine  0.6 mg Oral BID  . ferrous gluconate  324 mg Oral TID WC  . hydrALAZINE  75 mg Oral Q8H  . insulin aspart  0-9 Units Subcutaneous TID WC  . isosorbide mononitrate  30 mg Oral Daily  . morphine  30 mg Oral Q12H  . pantoprazole  40 mg Oral BID  . sodium chloride  10-40 mL Intracatheter Q12H  . sodium chloride  3 mL Intravenous Q12H  . spironolactone  25 mg Oral BID      . heparin 1,600 Units/hr (12/14/12 0351)   Assessment:  1.Acute biventricular CHF R>L     LVEF 15% 2. 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 H/o GI bleeding 4. CAD s/p CABG 2007 5. Gout 6. Morbid obesity 7. A/C renal failure 8. Hypokalemia 9. Hyponatremia - improved 10. Hypomagnesemia 11. Hypertension 12. Anxiety  PLAN: Hold diuretics today. Will need restart lasix after in the next day or two. Continue hydralazine/IMDUR at current dose. No ace due to elevated creatinine.  Urine IFE shows (looking for amyloid) : Area of slightly restricted mobility in the IgG and kappa lanes. Suggest repeat in 6-8 months, if clinically indicated.  WBC up slightly but no fever or signs of focal infection. Low threshold to culture if continue to increase or has fever.  VVS has tentatively scheduled amputation of toes for tomorrow. I am OK with this as long as co-ox and renal function are stable. Continue heparin for now.   Have consulted SW for depression screen.  Celexa started.   Will consult CIR for rehab - PT/OT consult requested.    LOS: 11 days   CLEGG,AMY NP-C 8:09 AM

## 2012-12-14 NOTE — Progress Notes (Signed)
Thank you for consult on Jose Petersen. Patient was evaluated on 12/11/12 and was supervision for transfers and ambulated 280 feet with min/guard assist. HHPT recommended past discharge. Will follow up past re-evaluation tomorrow.

## 2012-12-14 NOTE — Op Note (Signed)
Procedure: Amputation toes 3 and 4 right foot Preop: gangrene toes 3 and 4  Postop: same Anesthesia: General Specimens: toes 3 and 4  Asst: Nurse  Details: After obtaining informed consent, the patient was taken to the operating room. The patient was placed in supine position on the operating room table. After induction of general anesthesia and placement of a laryngeal mask, the patient's entire right foot was prepped and draped in the usual sterile fashion. Next a circumferential incision was made in the midportion of the right third and fourth toes. The toes were amputated in the midsegment of the middle phalanx. The toes were passed off the table as a specimen. Rongeurs were used to debride back sharp edges of the phalanx. The wounds were thoroughly irrigated with normal saline solution. The skin edges were reapproximated using interrupted 3-0 nylon vertical mattress sutures. The patient tolerated the procedure well and there were no complications. The patient was taken to the recovery room in stable condition. Instrument sponge and needle counts were correct at the end of the case.  Fabienne Bruns, MD [facility] Office: [phone] Pager: [phone]

## 2012-12-14 NOTE — Progress Notes (Signed)
Advanced Heart Failure Rounding Note  Subjective:   Jose Petersen is a 47 y/o male with h/o CAD and ischemic CM s/p MI and previous CABG 2007. Last cath 2009 with patent grafts and EF 45%.   Admitted with anasarca and 50lb weight gain. Echo with EF 15% with RV dysfunction. Has had progressive HF symptoms for several months. No angina like he had with MI. Has had sluggish response to diuretics. Course c/b emboli to toes and nephrotic range proteinuria felt secondary to DM2.  Underwent placement of PA cath on 1/7. Started on milrinone and lasix gtt. Diuresed 71 pounds.  Milrinone and IV lasix stopped 12/13/12.OVerall weight down 71 pounds. Co-ox 62.3.  Cr trend  1.2->1.4->1.7>1.65  cMRI with EF back up to 41%. Probable old MI with other areas of patchy uptake suggesting possible myocarditis (vs infiltrative CM). 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    Objective:  Vital Signs in the last 24 hours: Temp:  [97.3 F (36.3 C)-98.5 F (36.9 C)] 98 F (36.7 C) (01/13 0800) Pulse Rate:  [69-73] 73  (01/13 0800) Resp:  [17-18] 17  (01/13 0800) BP: (105-127)/(58-82) 122/82 mmHg (01/13 0800) SpO2:  [95 %-99 %] 96 % (01/13 0800) Weight:  [102.6 kg (226 lb 3.1 oz)-102.876 kg (226 lb 12.8 oz)] 102.6 kg (226 lb 3.1 oz) (01/13 0313)  Intake/Output from previous day: 01/12 0701 - 01/13 0700 In: 1744 [P.O.:1240; I.V.:504] Out: 1830 [Urine:1830] Intake/Output from this shift:    Physical Exam: Lying in bed. NAD.  HEENT: Unremarkable Neck:  Supple.  Lungs:  CTA HEART:  Regular rate rhythm, distant, no murmurs, no rubs, no clicks.  Abd:  obese, mildly distended, NT Ext: trivial edema; R 3/4 right toes blue. PICC RUE Neuro:  CN II through XII intact, motor grossly intact  Lab Results:  Basename 12/14/12 0400 12/13/12 0430  WBC 6.4 12.1*  HGB 11.0* 11.8*  PLT 283 307    Basename 12/14/12 0400 12/13/12 0430  NA 132* 130*  K 3.9 4.0    CL 90* 84*  CO2 30 33*  GLUCOSE 107* 119*  BUN 20 17  CREATININE 1.65* 1.74*    Cardiac Studies: Tele - nsr, PVCs     . aspirin  81 mg Oral Daily  . atorvastatin  10 mg Oral q1800  . carvedilol  3.125 mg Oral BID WC  . citalopram  10 mg Oral Daily  . colchicine  0.6 mg Oral BID  . ferrous gluconate  324 mg Oral TID WC  . hydrALAZINE  75 mg Oral Q8H  . insulin aspart  0-9 Units Subcutaneous TID WC  . isosorbide mononitrate  30 mg Oral Daily  . morphine  30 mg Oral Q12H  . pantoprazole  40 mg Oral BID  . sodium chloride  10-40 mL Intracatheter Q12H  . sodium chloride  3 mL Intravenous Q12H      . heparin 1,600 Units/hr (12/14/12 0351)   Assessment:  1.Acute biventricular CHF R>L     LVEF 15%     cMRI EF 41% - possible myocarditis picture. 2. 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 H/o GI bleeding 4. CAD s/p CABG 2007 5. Gout 6. Morbid obesity 7. A/C renal failure 8. Hypokalemia 9. Hyponatremia - improved 10. Hypomagnesemia 11. Hypertension 12. Anxiety  PLAN:  Patient seen and examined with Tonye Becket, NP. We discussed all aspects of the encounter. I agree with the assessment. My thoughts below:  Looks much, much better. Renal function improving, co-ox good. Will continue to hold diuretics. Can restart in a few days. Continue hydralazine/IMDUR at current dose. No ace due to elevated creatinine.   OK for OR today. Hopefully he can go to CIR after to help with deconditioning. Will ask [facility] if they think he needs coumadin on d/c. No source for emboli found at this point but suspect cardio-embolic.  Urine IFE shows (looking for amyloid) : Area of slightly restricted mobility in the IgG and kappa lanes. Suggest repeat in 6-8 months, if clinically indicated.  Have consulted SW for depression screen.  Celexa started.     LOS: 11 days   Arvilla Meres NP-C 8:27 AM

## 2012-12-14 NOTE — Transfer of Care (Signed)
Immediate Anesthesia Transfer of Care Note  Patient: Jose Petersen  Procedure(s) Performed: Procedure(s) (LRB) with comments: AMPUTATION DIGIT (Right) - Amputation of 3rd & 4th toes of right foot   Patient Location: PACU  Anesthesia Type:General  Level of Consciousness: awake, alert , oriented and sedated  Airway & Oxygen Therapy: Patient Spontanous Breathing and Patient connected to nasal cannula oxygen  Post-op Assessment: Report given to PACU RN, Post -op Vital signs reviewed and stable and Patient moving all extremities  Post vital signs: Reviewed and stable  Complications: No apparent anesthesia complications

## 2012-12-15 ENCOUNTER — Encounter (HOSPITAL_COMMUNITY): Payer: Self-pay | Admitting: Vascular Surgery

## 2012-12-15 LAB — BASIC METABOLIC PANEL
CO2: 27 mEq/L (ref 19–32)
Calcium: 10.2 mg/dL (ref 8.4–10.5)
Creatinine, Ser: 1.55 mg/dL — ABNORMAL HIGH (ref 0.50–1.35)
GFR calc non Af Amer: 51 mL/min — ABNORMAL LOW (ref >90)
Glucose, Bld: 113 mg/dL — ABNORMAL HIGH (ref 70–99)
Sodium: 129 mEq/L — ABNORMAL LOW (ref 135–145)

## 2012-12-15 LAB — CBC
MCH: 22.6 pg — ABNORMAL LOW (ref 26.0–34.0)
MCV: 73.5 fL — ABNORMAL LOW (ref 78.0–100.0)
Platelets: 292 10*3/uL (ref 150–400)
RBC: 5.21 MIL/uL (ref 4.22–5.81)
RDW: 19.5 % — ABNORMAL HIGH (ref 11.5–15.5)

## 2012-12-15 LAB — GLUCOSE, CAPILLARY
Glucose-Capillary: 116 mg/dL — ABNORMAL HIGH (ref 70–99)
Glucose-Capillary: 130 mg/dL — ABNORMAL HIGH (ref 70–99)
Glucose-Capillary: 109 mg/dL — ABNORMAL HIGH (ref 70–99)

## 2012-12-15 LAB — CARBOXYHEMOGLOBIN
Methemoglobin: 1.5 % (ref 0.0–1.5)
Total hemoglobin: 12 g/dL — ABNORMAL LOW (ref 13.5–18.0)

## 2012-12-15 MED ORDER — DIPHENHYDRAMINE HCL 50 MG/ML IJ SOLN: 12.5000 mg | Freq: Four times a day (QID) | INTRAMUSCULAR | Status: DC | PRN

## 2012-12-15 MED ORDER — CITALOPRAM HYDROBROMIDE 20 MG PO TABS
20.0000 mg | ORAL_TABLET | Freq: Every day | ORAL | Status: DC
  Administered 2012-12-15 – 2012-12-18 (×4): 20 mg via ORAL
  Filled 2012-12-15 (×4): qty 1

## 2012-12-15 MED ORDER — DIPHENHYDRAMINE HCL 12.5 MG/5ML PO ELIX
12.5000 mg | ORAL_SOLUTION | Freq: Four times a day (QID) | ORAL | Status: DC | PRN
  Filled 2012-12-15: qty 5

## 2012-12-15 MED ORDER — BISACODYL 5 MG PO TBEC: 5.0000 mg | DELAYED_RELEASE_TABLET | Freq: Every day | ORAL | Status: DC | PRN

## 2012-12-15 MED ORDER — OXYCODONE-ACETAMINOPHEN 5-325 MG PO TABS
1.0000 | ORAL_TABLET | ORAL | Status: DC | PRN
  Administered 2012-12-15 – 2012-12-18 (×9): 2 via ORAL
  Filled 2012-12-15 (×9): qty 2

## 2012-12-15 MED ORDER — HYDROMORPHONE 0.3 MG/ML IV SOLN
INTRAVENOUS | Status: DC
  Administered 2012-12-15: 3.3 mg via INTRAVENOUS
  Administered 2012-12-15: 16:00:00 via INTRAVENOUS
  Administered 2012-12-16: 1.5 mg via INTRAVENOUS
  Administered 2012-12-16 (×2): via INTRAVENOUS
  Administered 2012-12-16: 0.9 mg via INTRAVENOUS
  Administered 2012-12-16: 1.5 mg via INTRAVENOUS
  Administered 2012-12-16: 0.9 mg via INTRAVENOUS
  Administered 2012-12-17 (×2): via INTRAVENOUS
  Administered 2012-12-17: 2.4 mg via INTRAVENOUS
  Filled 2012-12-15 (×3): qty 25

## 2012-12-15 MED ORDER — POLYETHYLENE GLYCOL 3350 17 G PO PACK
17.0000 g | PACK | Freq: Every day | ORAL | Status: DC
  Administered 2012-12-15 – 2012-12-16 (×2): 17 g via ORAL
  Filled 2012-12-15 (×4): qty 1

## 2012-12-15 MED ORDER — DOCUSATE SODIUM 100 MG PO CAPS
100.0000 mg | ORAL_CAPSULE | Freq: Two times a day (BID) | ORAL | Status: DC
  Administered 2012-12-15 – 2012-12-18 (×7): 100 mg via ORAL
  Filled 2012-12-15 (×8): qty 1

## 2012-12-15 MED ORDER — SODIUM CHLORIDE 0.9 % IJ SOLN: 9.0000 mL | INTRAMUSCULAR | Status: DC | PRN

## 2012-12-15 MED ORDER — ONDANSETRON HCL 4 MG/2ML IJ SOLN: 4.0000 mg | Freq: Four times a day (QID) | INTRAMUSCULAR | Status: DC | PRN

## 2012-12-15 MED ORDER — NALOXONE HCL 0.4 MG/ML IJ SOLN: 0.4000 mg | INTRAMUSCULAR | Status: DC | PRN

## 2012-12-15 NOTE — Progress Notes (Signed)
Physical Therapy Re-evaluation Patient Details Name: Jose Petersen MRN: [id number] DOB: 07-Dec-1965 Today's Date: 12/15/2012 Time: 4098-1191 PT Time Calculation (min): 22 min  PT Assessment / Plan / Recommendation Comments on Treatment Session    47 y/o with h/o CAD and ischemic CM s/p MI and previous CABG 2007. Admitted with anasarca and 50 lb weight gain as well as blue toe syndrome. 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 Present to PT today moving well. Needs reinforcement of PWB status throughout gait. Goals remain appropriate.     Follow Up Recommendations  Home health PT;Supervision for mobility/OOB     Does the patient have the potential to tolerate intense rehabilitation     Barriers to Discharge        Equipment Recommendations  Rolling walker with 5" wheels    Recommendations for Other Services    Frequency     Plan Discharge plan remains appropriate;Frequency remains appropriate    Precautions / Restrictions Precautions Precautions: Fall Restrictions Weight Bearing Restrictions: Yes RLE Weight Bearing: Partial weight bearing RLE Partial Weight Bearing Percentage or Pounds: No percentage specified but will follow 50% unless otherwise ordered   Pertinent Vitals/Pain C/o foot pain when he touches anything.    Mobility  Bed Mobility Sit to Supine: 6: Modified independent (Device/Increase time);HOB flat Transfers Transfers: Sit to Stand;Stand to Sit Sit to Stand: 6: Modified independent (Device/Increase time);From bed;With upper extremity assist Stand to Sit: To chair/3-in-1;With upper extremity assist;6: Modified independent (Device/Increase time) Ambulation/Gait Ambulation/Gait Assistance: 5: Supervision Ambulation Distance (Feet): 280 Feet Assistive device: Rolling walker Ambulation/Gait Assistance Details: cues for weight bearing status as pt tends to weight bear fully, cued pt to avoid weight shift onto forefoot with heel off (terminal stance) for  decreased pain and protection of incisional area; cues for tall posture and positioning within RW Gait Pattern: Step-to pattern;Decreased stance time - right;Antalgic;Trunk flexed      PT Goals Acute Rehab PT Goals PT Goal: Sit to Stand - Progress: Progressing toward goal PT Goal: Stand to Sit - Progress: Progressing toward goal PT Transfer Goal: Bed to Chair/Chair to Bed - Progress: Progressing toward goal PT Goal: Ambulate - Progress: Progressing toward goal  Visit Information  Last PT Received On: 12/15/12 Assistance Needed: +1    Subjective Data  Subjective: I need help figuring out my pain medicine schedule. The percocet worked really well but they haven't given me any since this morning.  Patient Stated Goal: home   Cognition  Overall Cognitive Status: Appears within functional limits for tasks assessed/performed Arousal/Alertness: Awake/alert Orientation Level: Appears intact for tasks assessed Behavior During Session: [facility] for tasks performed    Balance     End of Session PT - End of Session Equipment Utilized During Treatment: Gait belt Activity Tolerance: Patient tolerated treatment well Patient left: in chair;with call bell/phone within reach Nurse Communication: Mobility status   GP     [facility] HELEN 12/15/2012, 1:28 PM

## 2012-12-15 NOTE — Progress Notes (Signed)
1430 Cardiac Rehab Attempted to get pt to ambulate. He states that his pain is to bad right now to walk. When I questioned him if he had pain medication, he states that they are working on it. We will follow pt in the am.

## 2012-12-15 NOTE — Progress Notes (Signed)
VASCULAR PROGRESS NOTE  SUBJECTIVE: Moderate discomfort.  PHYSICAL EXAM: Filed Vitals:   12/14/12 1921 12/14/12 2356 12/15/12 0400 12/15/12 0420  BP: 133/76 126/86  142/79  Pulse: 79 75    Temp: 97.1 F (36.2 C) 97.8 F (36.6 C)  97.6 F (36.4 C)  TempSrc: Oral Oral  Oral  Resp: 18 20  17   Height:      Weight:   225 lb 8.5 oz (102.3 kg)   SpO2: 97% 99%  98%   Toe amp sites inspected. Look good. No bleeding or drainage.  LABS: Lab Results  Component Value Date   WBC 8.5 12/15/2012   HGB 11.8* 12/15/2012   HCT 38.3* 12/15/2012   MCV 73.5* 12/15/2012   PLT 292 12/15/2012   Lab Results  Component Value Date   CREATININE 1.55* 12/15/2012   CBG (last 3)   Basename 12/14/12 2117 12/14/12 1651 12/14/12 1102  GLUCAP 99 132* 132*     ASSESSMENT/PLAN: 1. 1 Day Post-Op s/p: amputation of right 3rd and 4th toes. 2. PCA for pain. 3. PWB right foot.  Cari Caraway Beeper: [phone] 12/15/2012

## 2012-12-15 NOTE — Progress Notes (Signed)
PT notes indicate that patient with further improvement in mobility. Would concur with follow up HHPT past discharge. Will defer formal rehab consult.

## 2012-12-15 NOTE — Progress Notes (Signed)
Advanced Heart Failure Rounding Note  Subjective:   Jose Petersen is a 47 y/o male with h/o CAD and ischemic CM s/p MI and previous CABG 2007. Last cath 2009 with patent grafts and EF 45%.   Admitted with anasarca and 50lb weight gain. Echo with EF 15% with RV dysfunction. Has had progressive HF symptoms for several months. No angina like he had with MI. Has had sluggish response to diuretics. Course c/b emboli to toes and nephrotic range proteinuria felt secondary to DM2.  Underwent placement of PA cath on 1/7. Started on milrinone and lasix gtt. Diuresed 71 pounds.  Milrinone and IV lasix stopped 12/13/12.Overall weight down 72 pounds. S/P L foot 3rd and 4th ray amputation (12/14/12)   Co-ox 64.9   Cr trend  1.2->1.4->1.7>1.65>1.55  cMRI with EF back up to 41%. Probable old MI with other areas of patchy uptake suggesting possible myocarditis (vs infiltrative CM). 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 Complains of R foot pain.    Objective:  Vital Signs in the last 24 hours: Temp:  [97.1 F (36.2 C)-98.4 F (36.9 C)] 97.6 F (36.4 C) (01/14 0420) Pulse Rate:  [72-96] 75  (01/13 2356) Resp:  [17-20] 17  (01/14 0420) BP: (70-143)/(53-90) 142/79 mmHg (01/14 0420) SpO2:  [95 %-99 %] 98 % (01/14 0420) Weight:  [225 lb 8.5 oz (102.3 kg)] 225 lb 8.5 oz (102.3 kg) (01/14 0400)  Intake/Output from previous day: 01/13 0701 - 01/14 0700 In: 1227 [P.O.:795; I.V.:432] Out: 1225 [Urine:1225] Intake/Output from this shift:    Physical Exam: Lying in bed. NAD.  HEENT: Unremarkable Neck:  Supple.  Lungs:  CTA HEART:  Regular rate rhythm, distant, no murmurs, no rubs, no clicks.  Abd:  obese, mildly distended, NT Ext: trivial edema; R foot dressing.  PICC RUE Neuro:  CN II through XII intact, motor grossly intact  Lab Results:  Basename 12/15/12 0424 12/14/12 0400  WBC 8.5 6.4  HGB 11.8* 11.0*  PLT 292 283    Basename  12/15/12 0424 12/14/12 0400  NA 129* 132*  K 4.1 3.9  CL 89* 90*  CO2 27 30  GLUCOSE 113* 107*  BUN 20 20  CREATININE 1.55* 1.65*    Cardiac Studies: Tele - nsr, PVCs     . aspirin  81 mg Oral Daily  . atorvastatin  10 mg Oral q1800  . carvedilol  3.125 mg Oral BID WC  . citalopram  10 mg Oral Daily  . colchicine  0.6 mg Oral BID  . ferrous gluconate  324 mg Oral TID WC  . heparin subcutaneous  5,000 Units Subcutaneous Q8H  . hydrALAZINE  75 mg Oral Q8H  . insulin aspart  0-9 Units Subcutaneous TID WC  . isosorbide mononitrate  30 mg Oral Daily  . morphine  30 mg Oral Q12H  . pantoprazole  40 mg Oral BID  . sodium chloride  10-40 mL Intracatheter Q12H  . sodium chloride  3 mL Intravenous Q12H     Assessment:  1.Acute biventricular CHF R>L     LVEF 15%     cMRI EF 41% - possible myocarditis picture. 2. 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 H/o GI bleeding 4. CAD s/p CABG 2007 5. Gout 6. Morbid obesity 7. A/C renal failure 8. Hypokalemia 9. Hyponatremia - improved 10. Hypomagnesemia 11. Hypertension 12. Anxiety  PLAN:  Appears euvolemic.  Renal function improving, co-ox good. Will continue to hold diuretics.  Can restart in a few days. Continue hydralazine/IMDUR at current dose. No ace due to elevated creatinine.   CIR to re-evaluate today. OT consult pending. PT to re-evaluate today.   Urine IFE shows (looking for amyloid) : Area of slightly restricted mobility in the IgG and kappa lanes. Suggest repeat in 6-8 months, if clinically indicated.  Have consulted SW for depression screen.  Celexa started.     LOS: 12 days   CLEGG,AMY NP-C 7:25 AM  Patient seen and examined with Tonye Becket, NP. We discussed all aspects of the encounter. I agree with the assessment and plan as stated above.   Doing well s/p amputation. Still with considerable foot pain. Volume status ok off diuretics. Renal function stable.   Will try to avoid PCA and use scheduled  percocet (with bowel regimen). If fails can use PCA. Would increase Celexa to 20 daily. Can transfer to tele. CIR to re-evaluate today. Hopefully he will qualify after his long hospitalization and deconditioning.   Jose Mccoin,MD 7:56 AM

## 2012-12-16 LAB — CARBOXYHEMOGLOBIN
Carboxyhemoglobin: 1.1 % (ref 0.5–1.5)
O2 Saturation: 57.8 %
Total hemoglobin: 11.4 g/dL — ABNORMAL LOW (ref 13.5–18.0)

## 2012-12-16 LAB — BASIC METABOLIC PANEL
Chloride: 92 mEq/L — ABNORMAL LOW (ref 96–112)
GFR calc Af Amer: 68 mL/min — ABNORMAL LOW (ref >90)
GFR calc non Af Amer: 59 mL/min — ABNORMAL LOW (ref >90)
Potassium: 4.1 mEq/L (ref 3.5–5.1)

## 2012-12-16 LAB — GLUCOSE, CAPILLARY
Glucose-Capillary: 123 mg/dL — ABNORMAL HIGH (ref 70–99)
Glucose-Capillary: 117 mg/dL — ABNORMAL HIGH (ref 70–99)

## 2012-12-16 LAB — CBC
HCT: 36.4 % — ABNORMAL LOW (ref 39.0–52.0)
Hemoglobin: 11.3 g/dL — ABNORMAL LOW (ref 13.0–17.0)
RDW: 19.8 % — ABNORMAL HIGH (ref 11.5–15.5)
WBC: 7.4 10*3/uL (ref 4.0–10.5)

## 2012-12-16 LAB — MAGNESIUM: Magnesium: 2.2 mg/dL (ref 1.5–2.5)

## 2012-12-16 MED ORDER — HYDRALAZINE HCL 50 MG PO TABS
75.0000 mg | ORAL_TABLET | Freq: Three times a day (TID) | ORAL | Status: DC
  Administered 2012-12-17 – 2012-12-18 (×5): 75 mg via ORAL
  Filled 2012-12-16 (×8): qty 1

## 2012-12-16 NOTE — Progress Notes (Signed)
Toe amputation sites clean and healing.   Continue dry dressings to foot. D/c when off IV pain meds I do not believe he needs coumadin from my standpoint. We have arranged for outpt CT to evaluate his aorta and lower extremities for source of emboli  Fabienne Bruns, MD

## 2012-12-16 NOTE — Plan of Care (Signed)
Problem: Limited Adherence to Nutrition-Related Recommendations (NB-1.6) Goal: Nutrition education Formal process to instruct or train a patient/client in a skill or to impart knowledge to help patients/clients voluntarily manage or modify food choices and eating behavior to maintain or improve health.  Outcome: Completed/Met Date Met:  12/16/12 Nutrition Education Note  Spoke with pt and two family members regarding a low sodium diet. Encouraged a 2,000 mg Na diet, reduction in consumption of processed foods and an increase in consumption of fresh fruits and vegetables and whole grain foods.  Pt and family members verbalized understanding. The teach back method and a handout on low sodium were used.  PTA pt was not following a low sodium diet. Currently placed on a carb modified, medium diet. Meal completion since admission has been 40-100%.  Current weight is 225 lb and BMI is 33.4-Obesity Class I, which is down from admission weight of 297 on 1/2 related to fluid loss.  Chart reviewed and no further nutrition interventions needed at this time   [facility] Dietetic Intern [address]  Clarene Duke RD, LDN [phone]

## 2012-12-16 NOTE — Progress Notes (Signed)
Physical Therapy Treatment Patient Details Name: Karlton Maya MRN: [id number] DOB: October 09, 1966 Today's Date: 12/16/2012 Time: 4098-1191 PT Time Calculation (min): 29 min  PT Assessment / Plan / Recommendation Comments on Treatment Session  47 y/o male with h/o CAD and ischemic CM s/p MI and previous CABG 2007. Admitted with anasarca and 50 lb weight gait as well as blue toe syndrom. 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 Continues to move well but needing continuous reminders for proper weight bearing with transfers and gait. Pt reports he's been walking around in his room fully weight bearing prior to my arrival today. Need to practice steps next visit.     Follow Up Recommendations  Home health PT     Does the patient have the potential to tolerate intense rehabilitation     Barriers to Discharge        Equipment Recommendations  Rolling walker with 5" wheels    Recommendations for Other Services    Frequency Min 3X/week   Plan Discharge plan remains appropriate;Frequency remains appropriate    Precautions / Restrictions     Pertinent Vitals/Pain PCA wasn't working when I walked into the room so RN notified however pt didn't want the PCA and would prefer percocet, he reports high levels of pain but does not appear affected by it with regards to his mobility    Mobility  Bed Mobility Bed Mobility: Supine to Sit Supine to Sit: 6: Modified independent (Device/Increase time) Transfers Transfers: Sit to Stand;Stand to Sit Sit to Stand: From bed;With upper extremity assist;5: Supervision Stand to Sit: 6: Modified independent (Device/Increase time);To bed;With upper extremity assist Details for Transfer Assistance: verbal cues for maintainence of PWB with transfers and placed RW in front of patient however he hasn't been using a RW at all today Ambulation/Gait Ambulation/Gait Assistance: 5: Supervision Ambulation Distance (Feet): 250 Feet Assistive device: Rolling  walker Ambulation/Gait Assistance Details: visual and verbal sequencing cues for PWB and proper use of RW to unweight his right foot Gait Pattern: Step-through pattern;Trunk flexed;Decreased stance time - right      PT Goals Acute Rehab PT Goals PT Goal: Sit to Stand - Progress: Progressing toward goal PT Goal: Stand to Sit - Progress: Progressing toward goal PT Transfer Goal: Bed to Chair/Chair to Bed - Progress: Progressing toward goal PT Goal: Ambulate - Progress: Progressing toward goal  Visit Information  Last PT Received On: 12/16/12 Assistance Needed: +1    Subjective Data  Subjective: I slept better last night.   Cognition  Overall Cognitive Status: Appears within functional limits for tasks assessed/performed Arousal/Alertness: Awake/alert Orientation Level: Appears intact for tasks assessed Behavior During Session: [facility] for tasks performed Cognition - Other Comments: listless, does laugh some but appears flat     Balance     End of Session PT - End of Session Equipment Utilized During Treatment: Gait belt Activity Tolerance: Patient tolerated treatment well Patient left: in bed;with call bell/phone within reach Nurse Communication: Mobility status   GP     [facility] HELEN 12/16/2012, 2:14 PM

## 2012-12-16 NOTE — Progress Notes (Signed)
Agree. See my note from same day for full details.  Daniel Bensimhon,MD 7:08 PM

## 2012-12-16 NOTE — Progress Notes (Signed)
Advanced Heart Failure Rounding Note  Subjective:   Jose Petersen is a 47 y/o male with h/o CAD and ischemic CM s/p MI and previous CABG 2007. Last cath 2009 with patent grafts and EF 45%.   Admitted with anasarca and 50lb weight gain. Echo with EF 15% with RV dysfunction. Has had progressive HF symptoms for several months. No angina like he had with MI. Has had sluggish response to diuretics. Course c/b emboli to toes and nephrotic range proteinuria felt secondary to DM2.  Underwent placement of PA cath on 1/7. Started on milrinone and lasix gtt. Diuresed 71 pounds. Milrinone and IV lasix stopped 12/13/12.Overall weight down 72 pounds. 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 Off diuretics for the last 2 days. Weight unchanged 225 pounds.   Co-ox  57.8  Cr trend  1.2->1.4->1.7>1.65>1.55>1.39  cMRI with EF back up to 41%. Probable old MI with other areas of patchy uptake suggesting possible myocarditis (vs infiltrative CM). Ab u/s: reports dimunitive aorta (not well visualized - no AAA)   Complains of R foot pain. Denies SOB/PND/Orthopnea    Objective:  Vital Signs in the last 24 hours: Temp:  [97.4 F (36.3 C)-97.9 F (36.6 C)] 97.4 F (36.3 C) (01/15 0612) Pulse Rate:  [70-78] 78  (01/15 0612) Resp:  [11-19] 12  (01/15 0800) BP: (119-131)/(73-104) 124/89 mmHg (01/15 0612) SpO2:  [94 %-100 %] 100 % (01/15 0800) Weight:  [225 lb (102.059 kg)-225 lb 12.8 oz (102.422 kg)] 225 lb 12.8 oz (102.422 kg) (01/15 0612)  Intake/Output from previous day: 01/14 0701 - 01/15 0700 In: 1095 [P.O.:972; I.V.:123] Out: 0  Intake/Output from this shift: Total I/O In: 240 [P.O.:240] Out: 300 [Urine:300]  Physical Exam: Lying in bed. NAD.  HEENT: Unremarkable Neck:  Supple.  Lungs:  CTA HEART:  Regular rate rhythm, distant, no murmurs, no rubs, no clicks.  Abd:  obese, mildly distended, NT Ext: trivial edema; R foot dressing.  PICC RUE Neuro:  CN II through XII intact, motor  grossly intact  Lab Results:  Basename 12/16/12 0500 12/15/12 0424  WBC 7.4 8.5  HGB 11.3* 11.8*  PLT 288 292    Basename 12/16/12 0500 12/15/12 0424  NA 130* 129*  K 4.1 4.1  CL 92* 89*  CO2 27 27  GLUCOSE 106* 113*  BUN 20 20  CREATININE 1.39* 1.55*    Cardiac Studies: Tele - nsr, PVCs     . aspirin  81 mg Oral Daily  . atorvastatin  10 mg Oral q1800  . carvedilol  3.125 mg Oral BID WC  . citalopram  20 mg Oral Daily  . colchicine  0.6 mg Oral BID  . docusate sodium  100 mg Oral BID  . ferrous gluconate  324 mg Oral TID WC  . heparin subcutaneous  5,000 Units Subcutaneous Q8H  . hydrALAZINE  75 mg Oral Q8H  . HYDROmorphone PCA 0.3 mg/mL   Intravenous Q4H  . insulin aspart  0-9 Units Subcutaneous TID WC  . isosorbide mononitrate  30 mg Oral Daily  . morphine  30 mg Oral Q12H  . pantoprazole  40 mg Oral BID  . polyethylene glycol  17 g Oral Daily  . sodium chloride  10-40 mL Intracatheter Q12H  . sodium chloride  3 mL Intravenous Q12H     Assessment:  1.Acute biventricular CHF R>L     LVEF 15%     cMRI EF 41% - possible myocarditis picture. 2. 1/12/03/12 S/P R 3rd and  4th toe amputation   3. H/o GI bleeding 4. CAD s/p CABG 2007 5. Gout 6. Morbid obesity 7. A/C renal failure 8. Hypokalemia 9. Hyponatremia - improved 10. Hypomagnesemia 11. Hypertension 12. Anxiety 13. Depression  PLAN: Stable S/P toe amputations on the R. Pain better controlled with PCA. Continue to mobilize. Dressing changes per [facility].   Appears euvolemic.  Renal function improving. CO-OX stable.  Hold diuretics. Can restart in a few days. Continue hydralazine/IMDUR at current dose. No ace due to elevated creatinine. Repeat Magnesium.    Urine IFE shows (looking for amyloid) : Area of slightly restricted mobility in the IgG and kappa lanes. Suggest repeat in 6-8 months, if clinically indicated.  Consult care management for [facility]. HHRN and HHPT once discharged.     Celexa started and up  titrated  Consult dietitian    LOS: 13 days   Petersen,AMY NP-C 9:21 AM  Patient seen and examined with Jose Becket, NP. We discussed all aspects of the encounter. I agree with the assessment and plan as stated above.   Doing great from a cardiac standpoint. Renal function improved. Volume status stable off diuretics. Main issue now is pain control s/p amputation. Can go home once this is under control. Will d/w [facility] if they feel there is any need for systemic anticoagulation. Consider restart lasix 40 daily in next day or two.   Jose Fischler,MD 2:11 PM

## 2012-12-17 LAB — BASIC METABOLIC PANEL
Calcium: 10.1 mg/dL (ref 8.4–10.5)
Creatinine, Ser: 1.37 mg/dL — ABNORMAL HIGH (ref 0.50–1.35)
GFR calc Af Amer: 69 mL/min — ABNORMAL LOW (ref >90)
GFR calc non Af Amer: 60 mL/min — ABNORMAL LOW (ref >90)

## 2012-12-17 LAB — GLUCOSE, CAPILLARY

## 2012-12-17 LAB — CBC
Platelets: 247 10*3/uL (ref 150–400)
RDW: 19.8 % — ABNORMAL HIGH (ref 11.5–15.5)
WBC: 6.7 10*3/uL (ref 4.0–10.5)

## 2012-12-17 MED ORDER — FUROSEMIDE 40 MG PO TABS
40.0000 mg | ORAL_TABLET | Freq: Every day | ORAL | Status: DC | PRN
  Filled 2012-12-17: qty 1

## 2012-12-17 NOTE — Progress Notes (Signed)
Advanced Heart Failure Rounding Note  Subjective:   [name] is a 47 y/o male with h/o CAD and ischemic CM s/p MI and previous CABG 2007. Last cath 2009 with patent grafts and EF 45%.   Admitted with anasarca and 50lb weight gain. Echo with EF 15% with RV dysfunction. Has had progressive HF symptoms for several months. No angina like he had with MI. Has had sluggish response to diuretics. Course c/b emboli to toes and nephrotic range proteinuria felt secondary to DM2.  Underwent placement of PA cath on 1/7. Started on milrinone and lasix gtt. Diuresed 71 pounds. Milrinone and IV lasix stopped 12/13/12.Overall weight down 72 pounds. 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 Off diuretics for the last 3 days. Weight unchanged 225 pounds. PCA stopped this am.    Cr trend  1.2->1.4->1.7>1.65>1.55>1.39>1.37  cMRI with EF back up to 41%. Probable old MI with other areas of patchy uptake suggesting possible myocarditis (vs infiltrative CM). Ab u/s: reports dimunitive aorta (not well visualized - no AAA)   Complains of R foot pain. Denies SOB/PND/Orthopnea    Objective:  Vital Signs in the last 24 hours: Temp:  [97.5 F (36.4 C)-97.7 F (36.5 C)] 97.7 F (36.5 C) (01/16 0445) Pulse Rate:  [60-66] 66  (01/16 0445) Resp:  [12-20] 12  (01/16 0800) BP: (99-138)/(51-87) 129/78 mmHg (01/16 0625) SpO2:  [97 %-100 %] 97 % (01/16 0800) Weight:  [225 lb 3.2 oz (102.15 kg)] 225 lb 3.2 oz (102.15 kg) (01/16 0445)  Intake/Output from previous day: 01/15 0701 - 01/16 0700 In: 510 [P.O.:480; I.V.:30] Out: 1550 [Urine:1550] Intake/Output from this shift: Total I/O In: 240 [P.O.:240] Out: -   Physical Exam: Drowsy Lying in bed. NAD.  HEENT: Unremarkable Neck:  Supple.  Lungs:  CTA HEART:  Regular rate rhythm, distant, no murmurs, no rubs, no clicks.  Abd:  obese, mildly distended, NT Ext: trivial edema; R foot dressing.  PICC RUE Neuro:  CN II through XII intact, motor grossly  intact  Lab Results:  Basename 12/17/12 0555 12/16/12 0500  WBC 6.7 7.4  HGB 10.8* 11.3*  PLT 247 288    Basename 12/17/12 0555 12/16/12 0500  NA 133* 130*  K 4.7 4.1  CL 95* 92*  CO2 26 27  GLUCOSE 98 106*  BUN 23 20  CREATININE 1.37* 1.39*    Cardiac Studies: Tele - nsr, PVCs     . aspirin  81 mg Oral Daily  . atorvastatin  10 mg Oral q1800  . carvedilol  3.125 mg Oral BID WC  . citalopram  20 mg Oral Daily  . colchicine  0.6 mg Oral BID  . docusate sodium  100 mg Oral BID  . ferrous gluconate  324 mg Oral TID WC  . heparin subcutaneous  5,000 Units Subcutaneous Q8H  . hydrALAZINE  75 mg Oral Q8H  . insulin aspart  0-9 Units Subcutaneous TID WC  . isosorbide mononitrate  30 mg Oral Daily  . morphine  30 mg Oral Q12H  . pantoprazole  40 mg Oral BID  . polyethylene glycol  17 g Oral Daily  . sodium chloride  10-40 mL Intracatheter Q12H  . sodium chloride  3 mL Intravenous Q12H     Assessment:  1.Acute biventricular CHF R>L     LVEF 15%     cMRI EF 41% - possible myocarditis picture. 2. 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 H/o GI bleeding 4. CAD  s/p CABG 2007 5. Gout 6. Morbid obesity 7. A/C renal failure 8. Hypokalemia 9. Hyponatremia - improved 10. Hypomagnesemia- resolved 11. Hypertension 12. Anxiety 13. Depression  PLAN: Stable S/P toe amputations on the R. Off PCA this am.  Will try and manage pain with oral medications.  Continue to mobilize. Dressing changes per VVS. Per VVS no need for anticoagulants. VVS arranged outpt CT to evaluate his aorta and lower extremities for source of emboli.  Appears euvolemic.  Renal function trending down. Will add lasix 40 mg po daily as needed if weight increases to 228 pounds.  Continue hydralazine/IMDUR at current dose. No ace due to elevated creatinine. Magnesium 2.2  Urine IFE shows (looking for amyloid) : Area of slightly restricted mobility in the IgG and kappa lanes. Suggest repeat in 6-8  months, if clinically indicated.  Consult care management for [facility]. HHRN and HHPT once discharged.    Celexa started and up titrated  Dietitian input appreciated  Anticipate D/C in am.    LOS: 14 days   CLEGG,Jose Petersen 10:18 AM   Patient seen and examined with Jose Becket, NP. We discussed all aspects of the encounter. I agree with the assessment and plan as stated above.   Looks much better. HF under control. Foot pain better. Anticipate d/c home in am. Will need low-dose diuretic on d/c with sliding scale.   Jose Hanover,MD 5:50 PM

## 2012-12-17 NOTE — Progress Notes (Signed)
[facility]   Subjective  - less pain overall  Objective 129/78 66 97.7 F (36.5 C) (Oral) 12 97%  Intake/Output Summary (Last 24 hours) at 12/17/12 0752 Last data filed at 12/17/12 0452  Gross per 24 hour  Intake    510 ml  Output   1550 ml  Net  -1040 ml   Right foot amputation sites healing, 2+ DP PT pulse right foot  Assessment/Planning: Atherembolic vs sm vessel disease now status post toe amp.  Ready for d/c from my standpoint when pain controlled.  CT as outpt already arranged.  No need for anticoagulation other than antiplatelet from my standpoint.  Raymie Trani E 12/17/2012 7:52 AM --  Laboratory Lab Results:  Basename 12/17/12 0555 12/16/12 0500  WBC 6.7 7.4  HGB 10.8* 11.3*  HCT 34.5* 36.4*  PLT 247 288   BMET  Basename 12/17/12 0555 12/16/12 0500  NA 133* 130*  K 4.7 4.1  CL 95* 92*  CO2 26 27  GLUCOSE 98 106*  BUN 23 20  CREATININE 1.37* 1.39*  CALCIUM 10.1 9.9    COAG Lab Results  Component Value Date   INR 1.13 08/28/2012   INR 1.09 08/27/2012   No results found for this basename: PTT    Antibiotics Anti-infectives    None

## 2012-12-17 NOTE — Discharge Summary (Signed)
Advanced Heart Failure Team  Discharge Summary   Patient ID: Jose Petersen MRN: [id number], DOB/AGE: 49-Dec-1967 49 y.o. Admit date: 12/03/2012 D/C date:     12/18/2012   Primary Discharge Diagnoses:  1.Acute biventricular CHF R>L  LVEF 15%  cMRI EF 41% - possible myocarditis  2. 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 H/o GI bleeding  4. CAD s/p CABG 2007  5. Gout  6. Morbid obesity  7. A/C renal failure  8. Hypokalemia  9. Hyponatremia - improved  10. Hypomagnesemia- resolved  11. Hypertension  12. Anxiety  13. Depression 14. Abnormal urine IFE    Hospital Course: Jose Petersen is a 47 y/o male with h/o CAD, ICM (EF 45%), MI, and CABG 2007.  Last cath 2009 with patent grafts.    Jose Petersen was admitted with anasarca and 50lb weight gain with progressive HF symptoms over the last few months. An ECHO was repeated and revealed  EF 15% with RV dysfunction. Jose Petersen was difficult to diurese and renal function worsened. Jose Petersen was placed on Milrinone and Lasix drip. Jose Petersen had a very good response. Overall Jose Petersen diuresed 73 pounds. Renal dysfunction improved. Discharge weight 224 pounds.   It was felt Jose Petersen likely had viral myocarditis on top of Jose Petersen previous ischemic cardiomyopathy. Cath was not performed due to renal insufficiency and low suspicion for an ischemic event. Cardiac MRI done priro to discharge showed EF back up to 41% and probable old MI with other areas of patchy uptake suggesting possible myocarditis (vs infiltrative CM). Urine IFE showed : Area of slightly restricted mobility in the IgG and kappa lanes. Will need to repeat in 6-8 months ro exclude myeloma.   Jose Petersen course was complicated by emboli to toes and nephrotic range proteinuria felt secondary to DM2. Jose Petersen later received amputation of right 3rd and 4th toe by Dr Darrick Penna. R foot wound will continue to have to be covered with dry gauze daily. Pain adequately managed with Percocet. Abdominal ultrasound showed no AAA or mural thrombus.  Vascular  Surgeon will repeat CT 01/07/13. Jose Petersen will not be placed on ace inhibitor due to elevated creatinine but will remain on hydralazine and IMDUR for afterload reduction. Creatinine improved at the time of discharge.    Jose Petersen was also started on an SSRI for depression.  Lengthy discussions occurred regarding low salt food choices, daily weights, medication compliance, and limiting fluids to < 2 liters per day. Jose Petersen will continue to be followed closely in the HF clinic with Jose Petersen first appointment December 25, 2012 at 9:45. Plan to repeat BMET next week. Jose Petersen will also be followed [facility] for HF management and R foot wound care.       Discharge Weight Range:  Admit Weight: 297 pounds       Discharge Weight: 224 pounds Discharge Vitals: Blood pressure 135/77, pulse 77, temperature 97.7 F (36.5 C), temperature source Oral, resp. rate 18, height 5\' 9"  (1.753 m), weight 224 lb 1.6 oz (101.651 kg), SpO2 98.00%.  Labs: Lab Results  Component Value Date   WBC 6.4 12/18/2012   HGB 10.7* 12/18/2012   HCT 34.1* 12/18/2012   MCV 74.3* 12/18/2012   PLT 297 12/18/2012     Lab 12/18/12 0600  NA 131*  K 3.9  CL 94*  CO2 26  BUN 25*  CREATININE 1.24  CALCIUM 9.8  PROT --  BILITOT --  ALKPHOS --  ALT --  AST --  GLUCOSE 111*   Lab Results  Component Value  Date   CHOL 152 08/29/2012   HDL 19* 08/29/2012   LDLCALC 95 08/29/2012   TRIG 192* 08/29/2012   BNP (last 3 results)  Basename 12/03/12 1253 08/28/12 0420  PROBNP 8983.0* 5097.0*    Diagnostic Studies/Procedures   No results found.  Discharge Medications     Medication List     As of 12/18/2012  9:02 AM    STOP taking these medications         lisinopril 20 MG tablet   Commonly known as: PRINIVIL,ZESTRIL      TAKE these medications         bisacodyl 5 MG EC tablet   Commonly known as: DULCOLAX   Take 1 tablet (5 mg total) by mouth daily as needed.      carvedilol 6.25 MG tablet   Commonly known as: COREG   Take 0.5 tablets (3.125 mg  total) by mouth 2 (two) times daily with a meal.      citalopram 20 MG tablet   Commonly known as: CELEXA   Take 1 tablet (20 mg total) by mouth daily.      colchicine 0.6 MG tablet   Take 1 tablet (0.6 mg total) by mouth 2 (two) times daily.      DSS 100 MG Caps   Take 100 mg by mouth 2 (two) times daily as needed for constipation.      ferrous gluconate 216 MG tablet   Commonly known as: FERGON   Take 1 tablet (216 mg total) by mouth 3 (three) times daily with meals.      freestyle lancets   Use as instructed      furosemide 40 MG tablet   Commonly known as: LASIX   Take 1 tablet (40 mg total) by mouth daily.      glucose blood test strip   Use as instructed      glucose monitoring kit monitoring kit   1 each by Does not apply route as needed for other.      hydrALAZINE 25 MG tablet   Commonly known as: APRESOLINE   Take 3 tablets (75 mg total) by mouth every 8 (eight) hours.      isosorbide mononitrate 30 MG 24 hr tablet   Commonly known as: IMDUR   Take 1 tablet (30 mg total) by mouth daily.      metFORMIN 500 MG tablet   Commonly known as: GLUCOPHAGE   Take 1 tablet (500 mg total) by mouth 2 (two) times daily with a meal.      oxyCODONE-acetaminophen 5-325 MG per tablet   Commonly known as: PERCOCET/ROXICET   Take 1-2 tablets by mouth every 4 (four) hours as needed.      potassium chloride SA 20 MEQ tablet   Commonly known as: K-DUR,KLOR-CON   Take 1 tablet (20 mEq total) by mouth daily.      rosuvastatin 10 MG tablet   Commonly known as: CRESTOR   Take 10 mg by mouth daily.          Disposition   The patient will be discharged in stable condition to home. Discharge Orders    Future Appointments: Provider: Department: Dept Phone: Center:   12/25/2012 9:45 AM [facility] [phone] None   12/30/2012 4:30 PM Lewayne Bunting, MD [facility] [place] [phone] [id number]    01/07/2013 1:30 PM Sherren Kerns, MD [facility] [phone] [facility]  Future Orders Please Complete By Expires   Diet - low sodium heart healthy      Increase activity slowly      (HEART FAILURE PATIENTS) Call MD:  Anytime you have any of the following symptoms: 1) 3 pound weight gain in 24 hours or 5 pounds in 1 week 2) shortness of breath, with or without a dry hacking cough 3) swelling in the hands, feet or stomach 4) if you have to sleep on extra pillows at night in order to breathe.      Heart Failure patients record your daily weight using the same scale at the same time of day      PICC line removal      Contraindication to ACEI at discharge      Comments:   elevated creatinine on hydralazine and IMDUR     Follow-up Information    Follow up with Arvilla Meres, MD. On 12/25/2012. (9:45 [place] Code [phone])    Contact information:   [address] [place] [address] [phone]       Follow up with Sherren Kerns, MD. On 01/07/2013. (at 1:30 )    Contact information:   [address] [place] [address] [phone]            Duration of Discharge Encounter: Greater than 35 minutes   Signed, CLEGG,AMY  12/18/2012, 9:02 AM  Patient seen and examined with Tonye Becket, NP. We discussed all aspects of the encounter. I agree with the assessment and plan as stated above. I have edited the note above to reflect my thoughts. Jose Petersen is ready for d/c. Will follow closely in HF clinic. Will need heme evaluation for abnormal IFE.   Truman Hayward 7:43 PM

## 2012-12-17 NOTE — Progress Notes (Signed)
Physical Therapy Treatment Patient Details Name: Jose Petersen MRN: [id number] DOB: [birth year] Today's Date: 12/17/2012 Time: 1345-1410 PT Time Calculation (min): 25 min  PT Assessment / Plan / Recommendation Comments on Treatment Session  Pt admitted with anasarca and CHF s/p right 3rd and 4th toe amp. Pt with continued confusion from pain meds throughout the day today RN aware and reports clearer this PM than AM. Pt continues to ignore education for weigth bearing status and feel pt would benefit from Darco shoe to attempt to maintain decreased weight on healing amputation site.     Follow Up Recommendations        Does the patient have the potential to tolerate intense rehabilitation     Barriers to Discharge        Equipment Recommendations       Recommendations for Other Services    Frequency     Plan Discharge plan remains appropriate;Frequency remains appropriate    Precautions / Restrictions Precautions Precautions: Fall Restrictions RLE Weight Bearing: Partial weight bearing   Pertinent Vitals/Pain 7/10 surgical pain , right foot, RN aware, elevated end of session    Mobility  Bed Mobility Bed Mobility: Supine to Sit Supine to Sit: 6: Modified independent (Device/Increase time);HOB flat;With rails Transfers Sit to Stand: 6: Modified independent (Device/Increase time);From bed Stand to Sit: 5: Supervision;With armrests Details for Transfer Assistance: cueing for hand placement and safety as pt ditching RW grossly 5' away from chair despite cueing Ambulation/Gait Ambulation/Gait Assistance: 4: Min guard Ambulation Distance (Feet): 550 Feet Assistive device: Rolling walker Ambulation/Gait Assistance Details: constant cueing for sequence with cueing to try to perform more heel weight bearing as pt not following PWB with bil UE use despite cues Gait Pattern: Step-to pattern;Trunk flexed Gait velocity: decreased Stairs: Yes Stairs Assistance: 4: Min guard Stair  Management Technique: One rail Left (cueing for sequence and attempted to try RW but pt denied) Number of Stairs: 2     Exercises General Exercises - Lower Extremity Long Arc Quad: AROM;Both;20 reps;Seated Hip ABduction/ADduction: AROM;Both;20 reps;Seated Hip Flexion/Marching: AROM;Both;20 reps;Seated   PT Diagnosis:    PT Problem List:   PT Treatment Interventions:     PT Goals Acute Rehab PT Goals PT Goal: Sit to Stand - Progress: Met PT Goal: Stand to Sit - Progress: Progressing toward goal PT Goal: Ambulate - Progress: Progressing toward goal PT Goal: Up/Down Stairs - Progress: Progressing toward goal PT Goal: Perform Home Exercise Program - Progress: Progressing toward goal  Visit Information  Last PT Received On: 12/17/12 Assistance Needed: +1 Reason Eval/Treat Not Completed: Other (comment) (just completed working with cardiac rehab)    Subjective Data  Subjective: "[birth year]"- in regard to birthdate, "Its the 25 or so of February"   Cognition  Overall Cognitive Status: Impaired Area of Impairment: Memory Arousal/Alertness: Awake/alert Orientation Level: Disoriented to;Person;Time;Other (comment) (room number) Behavior During Session: Flat affect Memory: Decreased recall of precautions Cognition - Other Comments: Pt continually releases RW or ceases adherence to [facility] with ambulation and requires constant cueing    Balance     End of Session PT - End of Session Equipment Utilized During Treatment: Gait belt Activity Tolerance: Patient tolerated treatment well Patient left: in chair;with call bell/phone within reach Nurse Communication: Mobility status   GP     Delorse Lek 12/17/2012, 2:15 PM Delaney Meigs, PT [phone]

## 2012-12-17 NOTE — Progress Notes (Signed)
OT Cancellation Note  Patient Details Name: Bryer Cozzolino MRN: [id number] DOB: 09/25/1966   Cancelled Treatment:     Pt initially refused secondary to fatigue from just having worked with cardiac rehab. Pt ambulating with PT upon second attempt. Will check back tomorrow if times allows. Thank you. Glendale Chard, OTR/L Pager: [phone] 12/17/2012    Keyanni Whittinghill 12/17/2012, 2:15 PM

## 2012-12-17 NOTE — Progress Notes (Signed)
CARDIAC REHAB PHASE I   PRE:  Rate/Rhythm: 71 SR  BP:  Supine:   Sitting: 115/71  Standing:    SaO2: 96 RA  MODE:  Ambulation: 460 ft   POST:  Rate/Rhythem: 80  BP:  Supine:   Sitting: 120/74  Standing:    SaO2: 95 RA 1005-1100 Assisted X 1 and used walker to ambulate. Gait steady with walker. Pt speech is disoriented. He tried to go into another pt's room at the opposite end of the hall. It is hard to make get anything out of his conversation. Pt states that he is ready to get some of this medication out of his system. Attempted to discuss CHF with pt but unsuccessful.Pt to bed after walk with call light in reach, tried to get him in recliner but he declined. Reported to pt's nurse.  Beatrix Fetters

## 2012-12-17 NOTE — Progress Notes (Signed)
PT Cancellation Note  Patient Details Name: Jose Petersen MRN: [id number] DOB: 1966-03-30   Cancelled Treatment:    Reason Eval/Treat Not Completed: Other (comment) (just completed working with cardiac rehab) Have scheduled listed for PT to see in AM but cardiac rehab stated they were unaware and already worked with pt. Will attempt to see in PM as time allows for stair training.   Delaney Meigs, PT [phone]

## 2012-12-17 NOTE — Progress Notes (Signed)
Pt not keeping his nasal canula on his nose nor his pulse ox on his finger, therefore PCA pump will not deliver medication.  Informed pt of this and pt did not want these things on and requested that the tubing be off of him.  Notified MD, order given to DC PCA dilaudid.  Will continue to monitor.

## 2012-12-18 LAB — CARBOXYHEMOGLOBIN
Carboxyhemoglobin: 1.9 % — ABNORMAL HIGH (ref 0.5–1.5)
Methemoglobin: 1 % (ref 0.0–1.5)
O2 Saturation: 74.2 %

## 2012-12-18 LAB — GLUCOSE, CAPILLARY

## 2012-12-18 LAB — CBC
MCV: 74.3 fL — ABNORMAL LOW (ref 78.0–100.0)
Platelets: 297 10*3/uL (ref 150–400)
RBC: 4.59 MIL/uL (ref 4.22–5.81)
WBC: 6.4 10*3/uL (ref 4.0–10.5)

## 2012-12-18 LAB — BASIC METABOLIC PANEL
CO2: 26 mEq/L (ref 19–32)
Calcium: 9.8 mg/dL (ref 8.4–10.5)
Chloride: 94 mEq/L — ABNORMAL LOW (ref 96–112)
GFR calc Af Amer: 78 mL/min — ABNORMAL LOW (ref >90)
Sodium: 131 mEq/L — ABNORMAL LOW (ref 135–145)

## 2012-12-18 MED ORDER — CITALOPRAM HYDROBROMIDE 20 MG PO TABS: 20.0000 mg | ORAL_TABLET | Freq: Every day | ORAL | Status: DC

## 2012-12-18 MED ORDER — POTASSIUM CHLORIDE CRYS ER 20 MEQ PO TBCR
20.0000 meq | EXTENDED_RELEASE_TABLET | Freq: Every day | ORAL | Status: DC
  Administered 2012-12-18: 20 meq via ORAL
  Filled 2012-12-18: qty 1

## 2012-12-18 MED ORDER — FUROSEMIDE 40 MG PO TABS
40.0000 mg | ORAL_TABLET | Freq: Every day | ORAL | Status: DC
Start: 2012-12-18 — End: 2012-12-18
  Administered 2012-12-18: 40 mg via ORAL
  Filled 2012-12-18: qty 1

## 2012-12-18 MED ORDER — CARVEDILOL 6.25 MG PO TABS: 3.1250 mg | ORAL_TABLET | Freq: Two times a day (BID) | ORAL | Status: DC

## 2012-12-18 MED ORDER — FUROSEMIDE 40 MG PO TABS: 40.0000 mg | ORAL_TABLET | Freq: Every day | ORAL | Status: DC

## 2012-12-18 MED ORDER — DSS 100 MG PO CAPS: 100.0000 mg | ORAL_CAPSULE | Freq: Two times a day (BID) | ORAL | Status: DC | PRN

## 2012-12-18 MED ORDER — ISOSORBIDE MONONITRATE ER 30 MG PO TB24: 30.0000 mg | ORAL_TABLET | Freq: Every day | ORAL | Status: DC

## 2012-12-18 MED ORDER — POTASSIUM CHLORIDE CRYS ER 20 MEQ PO TBCR: 20.0000 meq | EXTENDED_RELEASE_TABLET | Freq: Every day | ORAL | Status: DC

## 2012-12-18 MED ORDER — BISACODYL 5 MG PO TBEC: 5.0000 mg | DELAYED_RELEASE_TABLET | Freq: Every day | ORAL | Status: DC | PRN

## 2012-12-18 MED ORDER — HYDRALAZINE HCL 25 MG PO TABS: 75.0000 mg | ORAL_TABLET | Freq: Three times a day (TID) | ORAL | Status: DC

## 2012-12-18 MED ORDER — OXYCODONE-ACETAMINOPHEN 5-325 MG PO TABS: 1.0000 | ORAL_TABLET | ORAL | Status: DC | PRN

## 2012-12-18 NOTE — Progress Notes (Signed)
Right arm PICC Line discontinued per order. Line intact at 41 cm. Manual pressure held and no active bleeding noted. Vaseline gauze pressure dressing applied and secured. Pt. Teaching performed and pt. Verbalized understanding.

## 2012-12-18 NOTE — Care Management Note (Addendum)
12/18/12 1145 In to speak with pt. about [facility] services, as well as giving pt. list of HH agencies to choose from.  Pt. chose [facility].  TC to Lupita Leash, with [facility], to give referral for [facility] RN for HF management.  Pt. states he has a rolling walker at home.  Pt. to dc home today.  Tera Mater, RN, BSN NCM [phone]  Noted order for [facility] PT as well.  TC to Lupita Leash, with [facility] to add HHPT for services. Tera Mater, RN, BSN Nurse Case Manager [phone]

## 2012-12-18 NOTE — Progress Notes (Addendum)
Advanced Heart Failure Rounding Note  Subjective:   Mr. Heslin is a 47 y/o male with h/o CAD and ischemic CM s/p MI and previous CABG 2007. Last cath 2009 with patent grafts and EF 45%.   Admitted with anasarca and 50lb weight gain. Echo with EF 15% with RV dysfunction. Has had progressive HF symptoms for several months. No angina like he had with MI. Has had sluggish response to diuretics. Course c/b emboli to toes and nephrotic range proteinuria felt secondary to DM2.  Underwent placement of PA cath on 1/7. Started on milrinone and lasix gtt. Diuresed 71 pounds. Milrinone and IV lasix stopped 12/13/12. Overall weight down 73 pounds. 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 Off diuretics for the last 4 days.   Cr trend  1.2->1.4->1.7>1.65>1.55>1.39>1.37>1.24  cMRI with EF back up to 41%. Probable old MI with other areas of patchy uptake suggesting possible myocarditis (vs infiltrative CM). Ab u/s: reports dimunitive aorta (not well visualized - no AAA)   Complains of R foot pain. Denies SOB/PND/Orthopnea    Objective:  Vital Signs in the last 24 hours: Temp:  [97.7 F (36.5 C)-97.8 F (36.6 C)] 97.7 F (36.5 C) (01/17 0648) Pulse Rate:  [67-77] 77  (01/17 0648) Resp:  [18] 18  (01/17 0648) BP: (122-135)/(74-77) 135/77 mmHg (01/17 0648) SpO2:  [98 %-100 %] 98 % (01/17 0648) Weight:  [101.651 kg (224 lb 1.6 oz)] 101.651 kg (224 lb 1.6 oz) (01/17 0648)  Intake/Output from previous day: 01/16 0701 - 01/17 0700 In: 1230 [P.O.:1200; I.V.:30] Out: 300 [Urine:300] Intake/Output from this shift: Total I/O In: 480 [P.O.:480] Out: -   Physical Exam: Lying in bed. NAD.  HEENT: Unremarkable Neck:  Supple.  Lungs:  CTA HEART:  Regular rate rhythm, distant, no murmurs, no rubs, no clicks.  Abd:  obese, mildly distended, NT Ext: trivial edema; R foot dressing.  PICC RUE Neuro:  CN II through XII intact, motor grossly intact  Lab Results:  Basename 12/18/12 0600 12/17/12  0555  WBC 6.4 6.7  HGB 10.7* 10.8*  PLT 297 247    Basename 12/18/12 0600 12/17/12 0555  NA 131* 133*  K 3.9 4.7  CL 94* 95*  CO2 26 26  GLUCOSE 111* 98  BUN 25* 23  CREATININE 1.24 1.37*    Cardiac Studies: Tele - nsr, PVCs     . aspirin  81 mg Oral Daily  . atorvastatin  10 mg Oral q1800  . carvedilol  3.125 mg Oral BID WC  . citalopram  20 mg Oral Daily  . colchicine  0.6 mg Oral BID  . docusate sodium  100 mg Oral BID  . ferrous gluconate  324 mg Oral TID WC  . furosemide  40 mg Oral Daily  . heparin subcutaneous  5,000 Units Subcutaneous Q8H  . hydrALAZINE  75 mg Oral Q8H  . insulin aspart  0-9 Units Subcutaneous TID WC  . isosorbide mononitrate  30 mg Oral Daily  . pantoprazole  40 mg Oral BID  . polyethylene glycol  17 g Oral Daily  . potassium chloride  20 mEq Oral Daily  . sodium chloride  10-40 mL Intracatheter Q12H  . sodium chloride  3 mL Intravenous Q12H     Assessment:  1.Acute biventricular CHF R>L     LVEF 15%     cMRI EF 41% - possible myocarditis picture. 2. 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 H/o GI bleeding 4. CAD  s/p CABG 2007 5. Gout 6. Morbid obesity 7. A/C renal failure 8. Hypokalemia 9. Hyponatremia - improved 10. Hypomagnesemia- resolved 11. Hypertension 12. Anxiety 13. Depression  PLAN: Stable S/P toe amputations on the R. Pain controlled with Percocet. Dressing changes per VVS. Per VVS no need for anticoagulants. VVS arranged outpt CT to evaluate his aorta and lower extremities for source of emboli.  Appears euvolemic.  Renal function trending down. Start Lasix 40 mg daily and Kdur 20 meq daily.  Continue hydralazine/IMDUR at current dose. No ace due to elevated creatinine.   Urine IFE shows (looking for amyloid) : Area of slightly restricted mobility in the IgG and kappa lanes. Suggest repeat in 6-8 months, if clinically indicated.    D/C home today    LOS: 15 days   Arvilla Meres MD 1:16  PM   Patient seen and examined with Tonye Becket, NP. We discussed all aspects of the encounter. I agree with the assessment and plan as stated above.   Much improved. Volume status looks good. Can go today. Follow closely in HF clinic.  Daniel Bensimhon,MD 1:17 PM

## 2012-12-18 NOTE — Progress Notes (Signed)
Advanced Heart Failure Rounding Note  Subjective:   Jose Petersen is a 47 y/o male with h/o CAD and ischemic CM s/p MI and previous CABG 2007. Last cath 2009 with patent grafts and EF 45%.   Admitted with anasarca and 50lb weight gain. Echo with EF 15% with RV dysfunction. Has had progressive HF symptoms for several months. No angina like he had with MI. Has had sluggish response to diuretics. Course c/b emboli to toes and nephrotic range proteinuria felt secondary to DM2.  Underwent placement of PA cath on 1/7. Started on milrinone and lasix gtt. Diuresed 71 pounds. Milrinone and IV lasix stopped 12/13/12. Overall weight down 73 pounds. 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 Off diuretics for the last 4 days.   Cr trend  1.2->1.4->1.7>1.65>1.55>1.39>1.37>1.24  cMRI with EF back up to 41%. Probable old MI with other areas of patchy uptake suggesting possible myocarditis (vs infiltrative CM). Ab u/s: reports dimunitive aorta (not well visualized - no AAA)   Complains of R foot pain. Denies SOB/PND/Orthopnea    Objective:  Vital Signs in the last 24 hours: Temp:  [97.7 F (36.5 C)-97.8 F (36.6 C)] 97.7 F (36.5 C) (01/17 0648) Pulse Rate:  [67-77] 77  (01/17 0648) Resp:  [18] 18  (01/17 0648) BP: (122-135)/(74-77) 135/77 mmHg (01/17 0648) SpO2:  [98 %-100 %] 98 % (01/17 0648) Weight:  [224 lb 1.6 oz (101.651 kg)] 224 lb 1.6 oz (101.651 kg) (01/17 0648)  Intake/Output from previous day: 01/16 0701 - 01/17 0700 In: 1230 [P.O.:1200; I.V.:30] Out: 300 [Urine:300] Intake/Output from this shift: Total I/O In: 480 [P.O.:480] Out: -   Physical Exam: Lying in bed. NAD.  HEENT: Unremarkable Neck:  Supple.  Lungs:  CTA HEART:  Regular rate rhythm, distant, no murmurs, no rubs, no clicks.  Abd:  obese, mildly distended, NT Ext: trivial edema; R foot dressing.  PICC RUE Neuro:  CN II through XII intact, motor grossly intact  Lab Results:  Basename 12/18/12 0600 12/17/12  0555  WBC 6.4 6.7  HGB 10.7* 10.8*  PLT 297 247    Basename 12/18/12 0600 12/17/12 0555  NA 131* 133*  K 3.9 4.7  CL 94* 95*  CO2 26 26  GLUCOSE 111* 98  BUN 25* 23  CREATININE 1.24 1.37*    Cardiac Studies: Tele - nsr, PVCs     . aspirin  81 mg Oral Daily  . atorvastatin  10 mg Oral q1800  . carvedilol  3.125 mg Oral BID WC  . citalopram  20 mg Oral Daily  . colchicine  0.6 mg Oral BID  . docusate sodium  100 mg Oral BID  . ferrous gluconate  324 mg Oral TID WC  . heparin subcutaneous  5,000 Units Subcutaneous Q8H  . hydrALAZINE  75 mg Oral Q8H  . insulin aspart  0-9 Units Subcutaneous TID WC  . isosorbide mononitrate  30 mg Oral Daily  . morphine  30 mg Oral Q12H  . pantoprazole  40 mg Oral BID  . polyethylene glycol  17 g Oral Daily  . sodium chloride  10-40 mL Intracatheter Q12H  . sodium chloride  3 mL Intravenous Q12H     Assessment:  1.Acute biventricular CHF R>L     LVEF 15%     cMRI EF 41% - possible myocarditis picture. 2. 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 H/o GI bleeding 4. CAD s/p CABG 2007 5. Gout 6. Morbid obesity 7.  A/C renal failure 8. Hypokalemia 9. Hyponatremia - improved 10. Hypomagnesemia- resolved 11. Hypertension 12. Anxiety 13. Depression  PLAN: Stable S/P toe amputations on the R. Pain controlled with Percocet. Dressing changes per [facility]. Per [facility] no need for anticoagulants. VVS arranged outpt CT to evaluate his aorta and lower extremities for source of emboli.  Appears euvolemic.  Renal function trending down. Start Lasix 40 mg daily and Kdur 20 meq daily.  Continue hydralazine/IMDUR at current dose. No ace due to elevated creatinine.   Urine IFE shows (looking for amyloid) : Area of slightly restricted mobility in the IgG and kappa lanes. Suggest repeat in 6-8 months, if clinically indicated.    D/C home today    LOS: 15 days   CLEGG,AMY NP-C 8:42 AM   Agree. See my note.   Daniel Bensimhon,MD 1:19  PM

## 2012-12-24 ENCOUNTER — Telehealth (HOSPITAL_COMMUNITY): Payer: Self-pay | Admitting: Adult Health

## 2012-12-24 NOTE — Telephone Encounter (Signed)
Called regarding colchicine. Ok to stop colchicine. Will reassess medications as office visit tomorrow.   Mr Allston verbalized understanding.  Yaeli Hartung.NP-Ca 9:42 AM

## 2012-12-25 ENCOUNTER — Ambulatory Visit (HOSPITAL_COMMUNITY)
Admit: 2012-12-25 | Discharge: 2012-12-25 | Disposition: A | Discharge status: Home / Self Care | Payer: Self-pay | Attending: Internal Medicine | Admitting: Internal Medicine

## 2012-12-25 ENCOUNTER — Encounter (HOSPITAL_COMMUNITY): Payer: Self-pay

## 2012-12-25 ENCOUNTER — Other Ambulatory Visit: Payer: Self-pay | Admitting: *Deleted

## 2012-12-25 ENCOUNTER — Telehealth: Payer: Self-pay

## 2012-12-25 VITALS — BP 128/82 | HR 72 | Wt 219.8 lb

## 2012-12-25 DIAGNOSIS — G8918 Other acute postprocedural pain: Secondary | ICD-10-CM

## 2012-12-25 DIAGNOSIS — I999 Unspecified disorder of circulatory system: Secondary | ICD-10-CM

## 2012-12-25 DIAGNOSIS — I998 Other disorder of circulatory system: Secondary | ICD-10-CM | POA: Insufficient documentation

## 2012-12-25 DIAGNOSIS — I5022 Chronic systolic (congestive) heart failure: Secondary | ICD-10-CM | POA: Insufficient documentation

## 2012-12-25 DIAGNOSIS — I50810 Right heart failure, unspecified: Secondary | ICD-10-CM

## 2012-12-25 LAB — BASIC METABOLIC PANEL
BUN: 20 mg/dL (ref 6–23)
Calcium: 9.9 mg/dL (ref 8.4–10.5)
GFR calc non Af Amer: 75 mL/min — ABNORMAL LOW (ref >90)
Glucose, Bld: 159 mg/dL — ABNORMAL HIGH (ref 70–99)
Sodium: 132 mEq/L — ABNORMAL LOW (ref 135–145)

## 2012-12-25 MED ORDER — HYDROCODONE-ACETAMINOPHEN 5-325 MG PO TABS: 1.0000 | ORAL_TABLET | Freq: Four times a day (QID) | ORAL | Status: DC | PRN

## 2012-12-25 NOTE — Patient Instructions (Addendum)
Follow up in 3 weeks  Take carvedilol 6.25 mg twice a day  Hold Lasix if your weight at home is 205 pounds  Do the following things EVERYDAY: 1) Weigh yourself in the morning before breakfast. Write it down and keep it in a log. 2) Take your medicines as prescribed 3) Eat low salt foods-Limit salt (sodium) to 2000 mg per day.  4) Stay as active as you can everyday 5) Limit all fluids for the day to less than 2 liters

## 2012-12-25 NOTE — Progress Notes (Signed)
Patient ID: Jose Petersen, male   DOB: 03-10-1966, 47 y.o.   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   Admitted to [facility] 12/03/2012 with anasarca and 50 pound weight gain. Placed on Milrinone and Lasix drip He was admitted with anasarca. An ECHO was repeated and revealed EF 15% with RV dysfunction. Cardiac MRI was performed with EF back up to 41% and probable old MI with other areas of patchy uptake suggesting possible myocarditis (vs infiltrative CM). Urine IFE shows (looking for amyloid) : Area of slightly restricted mobility in the IgG and kappa lanes. May need to repeat in 6-8 months, if clinically indicated. 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 Vascular Surgeon will repeat CT 01/07/13. He was not placed on ace inhibitor due to elevated creatinine but will remain on hydralazine and IMDUR for afterload reduction. Creatinine improved at the time of discharge. Overall he diuresed 73 pounds. Discharge weight 224 pounds.   He returns for post hospital follow up. He has had a couple days of nausea. Complains of pain in R foot managed with pain medication every 4 hours. Denies SOB/PND/Orthopnea. Denies lower extremity edema.Compliant with medications except he resumed his former carvedilol dose at 6.25 mg twice a day. Weight at home trending down to 209 pounds. [facility]HC following HF management.     ROS: All systems negative except as listed in HPI, PMH and Problem List.  Past Medical History  Diagnosis Date  . Coronary artery disease     a. 4V CABG 2007 in [place], possible mild MI. b. Repeat cath ~2009-2010 pre-op knee surgery that was reportedly "clean" per pt.  . Hypertension   . Gout   . Diverticulitis   . Hyperlipidemia   . GI bleed    a. Anemia/GIB with bleeding duodenal ulcer 08/2012.  Marland Kitchen Myocardial infarction 2007    "small one before OHS" (12/03/2012)  . Type II diabetes mellitus 2013  . Anxiety     "over the last couple weeks" (12/03/2012)  . Kidney stones ~ 1989; 10/2012  . Iron deficiency anemia     a. GIB 08/2012.  . Cardiomyopathy     a. EF 45-50% 08/2012.    Current Outpatient Prescriptions  Medication Sig Dispense Refill  . bisacodyl (DULCOLAX) 5 MG EC tablet Take 1 tablet (5 mg total) by mouth daily as needed.  30 tablet  3  . carvedilol (COREG) 6.25 MG tablet Take 0.5 tablets (3.125 mg total) by mouth 2 (two) times daily with a meal.  60 tablet  1  . citalopram (CELEXA) 20 MG tablet Take 1 tablet (20 mg total) by mouth daily.  30 tablet  3  . colchicine 0.6 MG tablet Take 1 tablet (0.6 mg total) by mouth 2 (two) times daily.  8 tablet  0  . docusate sodium 100 MG CAPS Take 100 mg by mouth 2 (two) times daily as needed for constipation.  30 capsule  2  . ferrous gluconate (FERGON) 216 MG tablet Take 1 tablet (216 mg total) by mouth 3 (three) times daily with meals.  90 tablet  1  . furosemide (LASIX) 40 MG tablet Take 1 tablet (40 mg total) by mouth daily.  30 tablet  3  .  glucose blood test strip Use as instructed  100 each  12  . glucose monitoring kit (FREESTYLE) monitoring kit 1 each by Does not apply route as needed for other.  1 each  0  . hydrALAZINE (APRESOLINE) 25 MG tablet Take 3 tablets (75 mg total) by mouth every 8 (eight) hours.  90 tablet  3  . isosorbide mononitrate (IMDUR) 30 MG 24 hr tablet Take 1 tablet (30 mg total) by mouth daily.  30 tablet  3  . Lancets (FREESTYLE) lancets Use as instructed  100 each  12  . metFORMIN (GLUCOPHAGE) 500 MG tablet Take 1 tablet (500 mg total) by mouth 2 (two) times daily with a meal.  60 tablet  1  . oxyCODONE-acetaminophen (PERCOCET/ROXICET) 5-325 MG per tablet Take 1-2 tablets by mouth every 4 (four) hours as needed.  90 tablet  0  . potassium chloride SA  (K-DUR,KLOR-CON) 20 MEQ tablet Take 1 tablet (20 mEq total) by mouth daily.  30 tablet  3  . rosuvastatin (CRESTOR) 10 MG tablet Take 10 mg by mouth daily.         PHYSICAL EXAM: Filed Vitals:   12/25/12 0935  BP: 128/82  Pulse: 72  Weight: 219 lb 12.8 oz (99.701 kg)  SpO2: 96%    General:  Well appearing. No resp difficulty Mom and Sister present HEENT: normal Neck: supple. JVP flat. Carotids 2+ bilaterally; no bruits. No lymphadenopathy or thryomegaly appreciated. Cor: PMI normal. Regular rate & rhythm. No rubs, gallops or murmurs. Lungs: clear Abdomen: soft, nontender, nondistended. No hepatosplenomegaly. No bruits or masses. Good bowel sounds. Extremities: no cyanosis, clubbing, rash, edema Neuro: alert & orientedx3, cranial nerves grossly intact. Moves all 4 extremities w/o difficulty. Affect pleasant.      ASSESSMENT & PLAN:

## 2012-12-25 NOTE — Telephone Encounter (Signed)
Pt. Called to request pain medication.  States his pain level is worse with walking; rates at 7-8/10.  Denies any redness/ inflammation at suture site/ states sutures intact.  Denies fevers or chills.  Next f/u appt. Is 01/07/13.  Advised pt. Will phone in Rx for Hydrocodone/ Acetaminophen 5/325 mg. Take 1 tab q 6 hrs/ prn/ qty #30; no refills (per standing order)  Pt. Agrees w/ plan.

## 2012-12-25 NOTE — Assessment & Plan Note (Signed)
NYHA II. Volume status stable. He titrated his carvedilol up to 6.25 mg twice a day without difficult. Will titrate further today. Instructed to hold lasix if his is less than 206 pounds because his weight continues to trend down. Reinforced medication compliance, low salt food choices, and daily weights. Provided information of heart failure diet class and he is considering.Check BMET today. Follow up in 3 weeks.

## 2012-12-25 NOTE — Assessment & Plan Note (Signed)
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 Follow up with Dr Darrick Penna 01/07/13

## 2012-12-28 ENCOUNTER — Telehealth (HOSPITAL_COMMUNITY): Payer: Self-pay | Admitting: Adult Health

## 2012-12-28 ENCOUNTER — Telehealth: Payer: Self-pay

## 2012-12-28 DIAGNOSIS — G8918 Other acute postprocedural pain: Secondary | ICD-10-CM

## 2012-12-28 MED ORDER — OXYCODONE-ACETAMINOPHEN 5-325 MG PO TABS: 1.0000 | ORAL_TABLET | ORAL | Status: DC | PRN

## 2012-12-28 NOTE — Telephone Encounter (Signed)
We will refer you to Dr Beverely Low .   Asked to return call for questions.   Foxx Klarich NP-C 4:12 PM

## 2012-12-28 NOTE — Telephone Encounter (Signed)
Pt. Called to report continued pain in right foot toe amp. site.  States the Vicodin that he rec'd on 1/24 "barely takes the edge off the pain".  Reported that the [facility] RN assessed the site and felt that the incisional area looked good.  Rates pain at "7/10".  Requesting to have Rx for Percocet, since the Vicodin is not working effectively.  Discussed w/ Dr. Myra Gianotti.  Rec'd v.o. For Percocet 5/325 mg, 1-2 tabs q 4 hrs/ prn/ pain; #30, no refills.  Pt. advised he can pick up Rx at the office.

## 2012-12-29 ENCOUNTER — Telehealth (HOSPITAL_COMMUNITY): Payer: Self-pay | Admitting: Cardiology

## 2012-12-29 NOTE — Telephone Encounter (Signed)
DONE. NEW PT APPT ON 12/31/12 @ 10AM PER STEPHANIE  [address] [place] [address] PT AWARE

## 2012-12-29 NOTE — Telephone Encounter (Signed)
Message copied by JEFFRIES, Milagros Reap on Tue Dec 29, 2012  4:20 PM ------      Message from: Villard, Virginia D      Created: Tue Dec 29, 2012  8:36 AM      Regarding: FW: New Referral from Dr Gala Romney        Any chace you can call him? Thanks Amy      ----- Message -----         From: Sheliah Hatch, MD         Sent: 12/29/2012   8:03 AM           To: Sherald Hess, NP      Subject: RE: New Referral from Dr Gala Romney                       No problem.  Have pt call and schedule new pt appt w/ our office- he can mention that cards is referring.  Our girls will take care of it.            Thanks!      Jae Dire      ----- Message -----         From: Sherald Hess, NP         Sent: 12/28/2012   4:13 PM           To: Sheliah Hatch, MD      Subject: New Referral from Dr Peri Maris Dr Beverely Low,            Dr Gala Romney would like to refer the above patient to your for primary care.             Thank you,      Amy Clegg NP-C

## 2012-12-30 ENCOUNTER — Encounter: Payer: Self-pay | Admitting: Cardiology

## 2012-12-31 ENCOUNTER — Encounter: Payer: Self-pay | Admitting: Family Medicine

## 2012-12-31 ENCOUNTER — Encounter: Payer: Self-pay | Admitting: *Deleted

## 2012-12-31 ENCOUNTER — Ambulatory Visit (INDEPENDENT_AMBULATORY_CARE_PROVIDER_SITE_OTHER): Payer: Self-pay | Admitting: Family Medicine

## 2012-12-31 ENCOUNTER — Other Ambulatory Visit: Payer: Self-pay

## 2012-12-31 VITALS — BP 120/72 | HR 66 | Temp 97.7°F | Ht 69.75 in | Wt 220.0 lb

## 2012-12-31 DIAGNOSIS — F418 Other specified anxiety disorders: Secondary | ICD-10-CM

## 2012-12-31 DIAGNOSIS — I10 Essential (primary) hypertension: Secondary | ICD-10-CM | POA: Insufficient documentation

## 2012-12-31 DIAGNOSIS — F341 Dysthymic disorder: Secondary | ICD-10-CM

## 2012-12-31 DIAGNOSIS — I2510 Atherosclerotic heart disease of native coronary artery without angina pectoris: Secondary | ICD-10-CM

## 2012-12-31 DIAGNOSIS — I5022 Chronic systolic (congestive) heart failure: Secondary | ICD-10-CM

## 2012-12-31 DIAGNOSIS — E119 Type 2 diabetes mellitus without complications: Secondary | ICD-10-CM

## 2012-12-31 DIAGNOSIS — E1151 Type 2 diabetes mellitus with diabetic peripheral angiopathy without gangrene: Secondary | ICD-10-CM

## 2012-12-31 DIAGNOSIS — E1159 Type 2 diabetes mellitus with other circulatory complications: Secondary | ICD-10-CM

## 2012-12-31 DIAGNOSIS — G8918 Other acute postprocedural pain: Secondary | ICD-10-CM

## 2012-12-31 DIAGNOSIS — I739 Peripheral vascular disease, unspecified: Secondary | ICD-10-CM

## 2012-12-31 DIAGNOSIS — D62 Acute posthemorrhagic anemia: Secondary | ICD-10-CM

## 2012-12-31 DIAGNOSIS — E785 Hyperlipidemia, unspecified: Secondary | ICD-10-CM

## 2012-12-31 MED ORDER — ZOLPIDEM TARTRATE 5 MG PO TABS: 5.0000 mg | ORAL_TABLET | Freq: Every evening | ORAL | Status: DC | PRN

## 2012-12-31 MED ORDER — ATORVASTATIN CALCIUM 20 MG PO TABS: 20.0000 mg | ORAL_TABLET | Freq: Every day | ORAL | Status: DC

## 2012-12-31 MED ORDER — HYDROCODONE-ACETAMINOPHEN 10-325 MG PO TABS: ORAL_TABLET | ORAL | Status: DC

## 2012-12-31 MED ORDER — CITALOPRAM HYDROBROMIDE 40 MG PO TABS: 40.0000 mg | ORAL_TABLET | Freq: Every day | ORAL | Status: DC

## 2012-12-31 NOTE — Assessment & Plan Note (Signed)
New to provider.  Following w/ heart failure clinic.  Not currently on Lasix b/c weight is w/in target range.  Pt to start lasix prn for fluid retention.  Will follow along and assist as able.

## 2012-12-31 NOTE — Assessment & Plan Note (Signed)
New to provider.  Pt has not been receiving regular DM care.  Last A1C September.  Currently on Metformin.  Recent fasting CBGs indicate good control.  Has never had diabetic eye exam- referral made.  Has f/u w/ vascular for recent toe amputation.  Check labs.  Adjust meds prn.

## 2012-12-31 NOTE — Assessment & Plan Note (Signed)
New to provider.  Pt already on celexa but at low dose.  Will increase to 40mg .  Strongly encouraged counseling due to all his recent stressors and the toll his medical conditions have taken.  Pt and mom to look into this but cost is a big issue for them.  Discussed [facility] [place] since they offer sliding fee scale.  ambien prn for sleep.  Will follow.

## 2012-12-31 NOTE — Assessment & Plan Note (Signed)
New to provider.  Currently on oral iron supplement and stool softener.  Recent CBC showed Hgb was stable.  Will continue to follow.

## 2012-12-31 NOTE — Assessment & Plan Note (Signed)
New to provider, ongoing for pt.  LDL goal is <70 due to CAD, DM.  Pt unable to afford Crestor.  Switch to generic lipitor.  Will get labs in 3 months.

## 2012-12-31 NOTE — Progress Notes (Signed)
  Subjective:    Patient ID: Jose Petersen, male    DOB: September 24, 1966, 47 y.o.   MRN: [id number]  HPI New to establish.  Previous PCP- [facility]  DM- chronic problem, was not following w/ anyone regularly.  Was started on meds while hospitalized but never had routine followup.  Currently on Metformin.  Since hospital DC CBGs 72-126 fasting.  Has not had recent eye exam.  S/p amputation of R 3rd and 4th toes due to microemboli while hospitalized.  CHF- chronic problem, following w/ Dr Gala Romney.  On Coreg, hydralazine, imdur, lasix.  Only taking lasix based on fluid status or weight gain.  No current edema or SOB.  CAD- s/p CABG in 2007, no CP, SOB  Hyperlipidemia- previously on Crestor, this was too expensive.  Not currently on any meds.  Most recent lipid panel was 08/28/12.  Denies abd pain, N/V, myalgias.  Blood loss anemia- most recent Hgb was 10.7 1 week ago.  Currently on iron (and a stool softener)  HTN- chronic problem, well controlled today on current meds.  Denies CP, SOB, HAs, visual changes, edema.  Anxiety- currently out of work, multiple medical problems, recent amputation of toes.  Currently on celexa 20mg  for anxiety and depression.  Having difficulty sleeping.   Review of Systems For ROS see HPI     Objective:   Physical Exam  Vitals reviewed. Constitutional: He is oriented to person, place, and time. He appears well-developed and well-nourished. No distress.       obese  HENT:  Head: Normocephalic and atraumatic.  Eyes: Conjunctivae normal and EOM are normal. Pupils are equal, round, and reactive to light.  Neck: Normal range of motion. Neck supple. No thyromegaly present.  Cardiovascular: Normal rate, regular rhythm, normal heart sounds and intact distal pulses.   No murmur heard. Pulmonary/Chest: Effort normal and breath sounds normal. No respiratory distress.  Abdominal: Soft. Bowel sounds are normal. He exhibits no distension.  Musculoskeletal: He exhibits no  edema.  Lymphadenopathy:    He has no cervical adenopathy.  Neurological: He is alert and oriented to person, place, and time. No cranial nerve deficit.  Skin: Skin is warm and dry.  Psychiatric: He has a normal mood and affect. His behavior is normal.          Assessment & Plan:

## 2012-12-31 NOTE — Assessment & Plan Note (Signed)
New to provider, chronic for pt.  Well controlled today.  Asymptomatic.  No med changes.

## 2012-12-31 NOTE — Assessment & Plan Note (Addendum)
New to provider, ongoing for pt.  S/P CABG.  Following w/ cards.  Goal is tight BP, DM, and cholesterol control.  Encouraged healthy diet and regular exercise as able.

## 2012-12-31 NOTE — Telephone Encounter (Signed)
Pt. Called and stated he still needs something to help with the post-op pain.  Is requesting to change from Percocet to Vicodin.  States he really wants to wean himself down.  Has f/u appt. 01/07/13.  Discussed w/ Dr. Darrick Penna. Rec'd v.o.  For Hydrocodone/ Acetaminophen 10/ 325 mg. 1-2 tabs q 6 hrs/ prn/ # 60/ no refills.  Per Dr. Darrick Penna, this is the last narcotic Rx refill, for this post-op period.

## 2012-12-31 NOTE — Patient Instructions (Addendum)
Follow up in 1 month to recheck mood We'll call you with your eye exam We'll notify you of your A1C result and adjust meds as needed Increase the celexa to 40mg - 2 of what you have at home, 1 of the new prescription Start the Ambien as needed for sleep Start the Lipitor nightly for cholesterol Call with any questions or concerns Welcome!  We're glad to have you! Hang in there!

## 2013-01-01 ENCOUNTER — Telehealth (HOSPITAL_COMMUNITY): Payer: Self-pay | Admitting: Anesthesiology

## 2013-01-01 MED ORDER — HYDRALAZINE HCL 25 MG PO TABS: 75.0000 mg | ORAL_TABLET | Freq: Three times a day (TID) | ORAL | Status: DC

## 2013-01-01 NOTE — Telephone Encounter (Signed)
Patient called about needing refill on hydralazine. Sent refill in to [facility] in [facility]

## 2013-01-04 DIAGNOSIS — I509 Heart failure, unspecified: Secondary | ICD-10-CM

## 2013-01-06 ENCOUNTER — Emergency Department (HOSPITAL_COMMUNITY)
Admission: EM | Admit: 2013-01-06 | Discharge: 2013-01-06 | Disposition: A | Discharge status: Home / Self Care | Payer: Self-pay | Attending: Emergency Medicine | Admitting: Emergency Medicine

## 2013-01-06 ENCOUNTER — Encounter (HOSPITAL_COMMUNITY): Payer: Self-pay | Admitting: *Deleted

## 2013-01-06 ENCOUNTER — Encounter: Payer: Self-pay | Admitting: Vascular Surgery

## 2013-01-06 ENCOUNTER — Telehealth: Payer: Self-pay | Admitting: Family Medicine

## 2013-01-06 DIAGNOSIS — Z87891 Personal history of nicotine dependence: Secondary | ICD-10-CM | POA: Insufficient documentation

## 2013-01-06 DIAGNOSIS — Z87442 Personal history of urinary calculi: Secondary | ICD-10-CM | POA: Insufficient documentation

## 2013-01-06 DIAGNOSIS — Z951 Presence of aortocoronary bypass graft: Secondary | ICD-10-CM | POA: Insufficient documentation

## 2013-01-06 DIAGNOSIS — E119 Type 2 diabetes mellitus without complications: Secondary | ICD-10-CM | POA: Insufficient documentation

## 2013-01-06 DIAGNOSIS — I428 Other cardiomyopathies: Secondary | ICD-10-CM | POA: Insufficient documentation

## 2013-01-06 DIAGNOSIS — I252 Old myocardial infarction: Secondary | ICD-10-CM | POA: Insufficient documentation

## 2013-01-06 DIAGNOSIS — R5383 Other fatigue: Secondary | ICD-10-CM | POA: Insufficient documentation

## 2013-01-06 DIAGNOSIS — Z79899 Other long term (current) drug therapy: Secondary | ICD-10-CM | POA: Insufficient documentation

## 2013-01-06 DIAGNOSIS — R531 Weakness: Secondary | ICD-10-CM

## 2013-01-06 DIAGNOSIS — E785 Hyperlipidemia, unspecified: Secondary | ICD-10-CM | POA: Insufficient documentation

## 2013-01-06 DIAGNOSIS — R42 Dizziness and giddiness: Secondary | ICD-10-CM | POA: Insufficient documentation

## 2013-01-06 DIAGNOSIS — Z8679 Personal history of other diseases of the circulatory system: Secondary | ICD-10-CM | POA: Insufficient documentation

## 2013-01-06 DIAGNOSIS — R5381 Other malaise: Secondary | ICD-10-CM | POA: Insufficient documentation

## 2013-01-06 DIAGNOSIS — Z8719 Personal history of other diseases of the digestive system: Secondary | ICD-10-CM | POA: Insufficient documentation

## 2013-01-06 DIAGNOSIS — Z862 Personal history of diseases of the blood and blood-forming organs and certain disorders involving the immune mechanism: Secondary | ICD-10-CM | POA: Insufficient documentation

## 2013-01-06 DIAGNOSIS — I10 Essential (primary) hypertension: Secondary | ICD-10-CM | POA: Insufficient documentation

## 2013-01-06 DIAGNOSIS — R195 Other fecal abnormalities: Secondary | ICD-10-CM | POA: Insufficient documentation

## 2013-01-06 DIAGNOSIS — Z8659 Personal history of other mental and behavioral disorders: Secondary | ICD-10-CM | POA: Insufficient documentation

## 2013-01-06 LAB — CBC
HCT: 41.1 % (ref 39.0–52.0)
Hemoglobin: 13.4 g/dL (ref 13.0–17.0)
MCH: 24.8 pg — ABNORMAL LOW (ref 26.0–34.0)
MCHC: 32.6 g/dL (ref 30.0–36.0)
MCV: 76 fL — ABNORMAL LOW (ref 78.0–100.0)
Platelets: 341 10*3/uL (ref 150–400)
RBC: 5.41 MIL/uL (ref 4.22–5.81)
RDW: 22.9 % — ABNORMAL HIGH (ref 11.5–15.5)
WBC: 8.5 10*3/uL (ref 4.0–10.5)

## 2013-01-06 LAB — BASIC METABOLIC PANEL
BUN: 17 mg/dL (ref 6–23)
CO2: 22 mEq/L (ref 19–32)
Calcium: 9.5 mg/dL (ref 8.4–10.5)
Chloride: 98 mEq/L (ref 96–112)
Creatinine, Ser: 1.03 mg/dL (ref 0.50–1.35)
GFR calc Af Amer: >90 mL/min (ref >90)
GFR calc non Af Amer: 84 mL/min — ABNORMAL LOW (ref >90)
Glucose, Bld: 113 mg/dL — ABNORMAL HIGH (ref 70–99)
Potassium: 3.8 mEq/L (ref 3.5–5.1)
Sodium: 135 mEq/L (ref 135–145)

## 2013-01-06 LAB — GLUCOSE, CAPILLARY: Glucose-Capillary: 124 mg/dL — ABNORMAL HIGH (ref 70–99)

## 2013-01-06 NOTE — Telephone Encounter (Signed)
Patient Information:  Caller Name: Stephanos  Phone: [phone]  Patient: Jose Petersen, Jose Petersen  Gender: Male  DOB: 1966-06-28  Age: 47 Years  PCP: Sheliah Hatch.  Office Follow Up:  Does the office need to follow up with this patient?: Yes  Instructions For The Office: OFFICE, PLEASE CALL PT BACK AT [phone] TO ADVISE IF HE CAN BE WORKED IN FOR APPT OR IF MD ADVISES HIM TO PROCEED TO ED.  Thanks.  RN Note:  No appts available in EPIC, will send note to office to see if pt can be worked in.   Symptoms  Reason For Call & Symptoms: Pt calling today 01/06/13 regarding he has history of bleeding ulcer with anemia.  Has been having blood in his stool again.  This is what happened last time.  Having black diarrhea.  He talked to his GI doctor and they recommended ED but to check with his PCP to see their advice.  Reviewed Health History In EMR: Yes  Reviewed Medications In EMR: Yes  Reviewed Allergies In EMR: Yes  Reviewed Surgeries / Procedures: Yes  Date of Onset of Symptoms: 01/05/2013  Guideline(s) Used:  Diarrhea  Disposition Per Guideline:   Go to ED Now (or to Office with PCP Approval)  Reason For Disposition Reached:   Black bowel movements  Advice Given:  N/A

## 2013-01-06 NOTE — ED Provider Notes (Signed)
History    47 year old male with a two-day history of dark colored stool and generalized fatigue. Patient with a past history of a GI bleed. He was admitted in September with a hemoglobin of 5.1. He had an EGD which showed a bleeding duodenal ulcer at that time. Patient concerned that his current symptoms may be a recurrence. His complaints and foot pain is status post recent toe amputation. This has been improving since his surgery though. Denies any acute pain anywhere else. No shortness of breath. No abdominal, back, chest or rectal pain. No use of blood thinning medication. Mild nausea but no vomiting.   CSN: [id number]  Arrival date & time 01/06/13  1704   First MD Initiated Contact with Patient 01/06/13 1828      Chief Complaint  Patient presents with  . Rectal Bleeding  . Dizziness    (Consider location/radiation/quality/duration/timing/severity/associated sxs/prior treatment) HPI  Past Medical History  Diagnosis Date  . Coronary artery disease     a. 4V CABG 2007 in [place], possible mild MI. b. Repeat cath ~2009-2010 pre-op knee surgery that was reportedly "clean" per pt.  . Hypertension   . Gout   . Diverticulitis   . Hyperlipidemia   . GI bleed     a. Anemia/GIB with bleeding duodenal ulcer 08/2012.  Marland Kitchen Myocardial infarction 2007    "small one before OHS" (12/03/2012)  . Type II diabetes mellitus 2013  . Anxiety     "over the last couple weeks" (12/03/2012)  . Kidney stones ~ 1989; 10/2012  . Iron deficiency anemia     a. GIB 08/2012.  . Cardiomyopathy     a. EF 45-50% 08/2012.    Past Surgical History  Procedure Date  . Coronary artery bypass graft 2007    CABG X 4  . Knee cartilage surgery 2009    "left" (12/03/2012)  . Esophagogastroduodenoscopy 08/28/2012    Procedure: ESOPHAGOGASTRODUODENOSCOPY (EGD);  Surgeon: Willis Modena, MD;  Location: [facility];  Service: Endoscopy;  Laterality: N/A;  . Tonsillectomy 1960's  . Knee arthroscopy 1990's-2000's    "3 on the  left; all arthoscopies for meninscus tear" (12/03/2012)  . Cardiac catheterization ~ 2009  . Cystoscopy/retrograde/ureteroscopy/stone extraction with basket ?1989  . Cystoscopy with retrograde pyelogram, ureteroscopy and stent placement 10/2012  . 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 COPD Father   . Heart disease Father   . High blood pressure Sister     History  Substance Use Topics  . Smoking status: Former Smoker -- 1.0 packs/day for 24 years    Types: Cigarettes    Quit date: 12/03/2003  . Smokeless tobacco: Never Used  . Alcohol Use: Yes     Comment: 1/2/2014occasional social, 1 drink per year "if that"      Review of Systems  All systems reviewed and negative, other than as noted in HPI.  Allergies  Toradol and Motrin  Home Medications   Current Outpatient Rx  Name  Route  Sig  Dispense  Refill  . ATORVASTATIN CALCIUM 20 MG PO TABS   Oral   Take 1 tablet (20 mg total) by mouth daily.   90 tablet   3   . BISACODYL 5 MG PO TBEC   Oral   Take 1  tablet (5 mg total) by mouth daily as needed.   30 tablet   3   . CARVEDILOL 6.25 MG PO TABS   Oral   Take 6.25 mg by mouth 2 (two) times daily with a meal.         . CITALOPRAM HYDROBROMIDE 40 MG PO TABS   Oral   Take 1 tablet (40 mg total) by mouth daily.   30 tablet   3   . COLCHICINE 0.6 MG PO TABS   Oral   Take 1 tablet (0.6 mg total) by mouth 2 (two) times daily.   8 tablet   0   . DSS 100 MG PO CAPS   Oral   Take 100 mg by mouth 2 (two) times daily as needed for constipation.   30 capsule   2   . FERROUS GLUCONATE 216 MG PO TABS   Oral   Take 1 tablet (216 mg total) by mouth 3 (three) times daily with meals.   90 tablet   1   . FUROSEMIDE 40 MG PO TABS   Oral   Take 1 tablet (40 mg total)  by mouth daily.   30 tablet   3   . GLUCOSE BLOOD VI STRP      Use as instructed   100 each   12   . FREESTYLE SYSTEM KIT   Does not apply   1 each by Does not apply route as needed for other.   1 each   0   . HYDRALAZINE HCL 25 MG PO TABS   Oral   Take 3 tablets (75 mg total) by mouth every 8 (eight) hours.   90 tablet   3   . HYDROCODONE-ACETAMINOPHEN 10-325 MG PO TABS      Take 1-2 tabs every 6 hr./ prn   60 tablet   0   . ISOSORBIDE MONONITRATE ER 30 MG PO TB24   Oral   Take 1 tablet (30 mg total) by mouth daily.   30 tablet   3   . FREESTYLE LANCETS MISC      Use as instructed   100 each   12   . METFORMIN HCL 500 MG PO TABS   Oral   Take 1 tablet (500 mg total) by mouth 2 (two) times daily with a meal.   60 tablet   1   . OXYCODONE-ACETAMINOPHEN 5-325 MG PO TABS   Oral   Take 1-2 tablets by mouth every 4 (four) hours as needed.   30 tablet   0   . POTASSIUM CHLORIDE CRYS ER 20 MEQ PO TBCR   Oral   Take 1 tablet (20 mEq total) by mouth daily.   30 tablet   3   . ZOLPIDEM TARTRATE 5 MG PO TABS   Oral   Take 1 tablet (5 mg total) by mouth at bedtime as needed for sleep.   30 tablet   1     BP 130/82  Pulse 83  Temp 98.2 F (36.8 C) (Oral)  Resp 20  SpO2 97%  Physical Exam  Nursing note and vitals reviewed. Constitutional: He appears well-developed and well-nourished. No distress.  HENT:  Head: Normocephalic and atraumatic.  Eyes: Conjunctivae normal are normal. Right eye exhibits no discharge. Left eye exhibits no discharge.  Neck: Neck supple.  Cardiovascular: Normal rate, regular rhythm and normal heart sounds.  Exam reveals no gallop and no friction rub.   No murmur heard. Pulmonary/Chest: Effort normal and  breath sounds normal. No respiratory distress.  Abdominal: Soft. He exhibits no distension. There is no tenderness.  Genitourinary:       Digital rectal exam: No external lesions noted. Normal tone. His stool does appear  very dark in color, but heme testing negative.  Musculoskeletal: He exhibits no edema and no tenderness.  Neurological: He is alert. No cranial nerve deficit. He exhibits normal muscle tone. Coordination normal.  Skin: Skin is warm and dry.  Psychiatric: He has a normal mood and affect. His behavior is normal. Thought content normal.    ED Course  Procedures (including critical care time)  Labs Reviewed  GLUCOSE, CAPILLARY - Abnormal; Notable for the following:    Glucose-Capillary 124 (*)    All other components within normal limits  CBC - Abnormal; Notable for the following:    MCV 76.0 (*)    MCH 24.8 (*)    RDW 22.9 (*)    All other components within normal limits  BASIC METABOLIC PANEL - Abnormal; Notable for the following:    Glucose, Bld 113 (*)    GFR calc non Af Amer 84 (*)    All other components within normal limits   No results found.  EKG:  Rhythm: normal sinus Vent. rate 67 BPM PR interval 148 ms QRS duration 106 ms QT/QTc 472/498 ms ST segments: Nonspecific ST change   1. Generalized weakness   2. Dark stools       MDM  47 year old male with generalized fatigue and dark-colored stool. Concerning for possible GI bleed. Patient stool is dark in color, but hemoccult negative though. H/H is normal. Platelets normal. Patient is not on any blood medications. He is not hypotensive nor tachycardic. Emergent return precautions were discussed with patient. There does not appear to be an emergent process at this time that requires further evaluation and ED or as an inpt. He understands the need to followup as an outpatient though.        Raeford Razor, MD 01/10/13 [phone]

## 2013-01-06 NOTE — Telephone Encounter (Signed)
Called patient, left message on VM instructing patient to seek medical attention at the Emergency Dept or Urgent Care as instructed per GI doctor.

## 2013-01-06 NOTE — ED Notes (Signed)
Pt reports noticing dark coffee ground stool yesterday and today.  Pt reports he started to have diarrhea today with same color stool and dizziness.  Pt reports being seen here for same last year October and was transfused with blood.  Pt reports his Hgb then was 5.  Pt is slightly pale.  Pt denies any abd pain at this time but reports R foot pain d/t recent toe amputation.  Pt also reports having an Endo the last time he was here and was found to have stomach ulcers.

## 2013-01-06 NOTE — Telephone Encounter (Signed)
Mom calling, states he missed the call back.  Instructed per note below to seek medical attention at the ED or UC.  She voiced an understanding.

## 2013-01-06 NOTE — Telephone Encounter (Signed)
Agree w/ ER evaluation

## 2013-01-07 ENCOUNTER — Other Ambulatory Visit: Payer: Self-pay

## 2013-01-07 ENCOUNTER — Ambulatory Visit: Payer: No Typology Code available for payment source | Admitting: Vascular Surgery

## 2013-01-12 ENCOUNTER — Inpatient Hospital Stay: Admission: RE | Admit: 2013-01-12 | Payer: Self-pay | Source: Ambulatory Visit

## 2013-01-12 ENCOUNTER — Ambulatory Visit (INDEPENDENT_AMBULATORY_CARE_PROVIDER_SITE_OTHER): Payer: Self-pay | Admitting: Neurosurgery

## 2013-01-12 ENCOUNTER — Telehealth: Payer: Self-pay | Admitting: Neurosurgery

## 2013-01-12 ENCOUNTER — Encounter: Payer: Self-pay | Admitting: Neurosurgery

## 2013-01-12 VITALS — BP 143/92 | HR 73 | Resp 18 | Ht 69.0 in | Wt 215.0 lb

## 2013-01-12 DIAGNOSIS — I70219 Atherosclerosis of native arteries of extremities with intermittent claudication, unspecified extremity: Secondary | ICD-10-CM

## 2013-01-12 DIAGNOSIS — M79676 Pain in unspecified toe(s): Secondary | ICD-10-CM | POA: Insufficient documentation

## 2013-01-12 DIAGNOSIS — M79609 Pain in unspecified limb: Secondary | ICD-10-CM

## 2013-01-12 MED ORDER — ACETAMINOPHEN-CODEINE 300-30 MG PO TABS: 1.0000 | ORAL_TABLET | Freq: Three times a day (TID) | ORAL | Status: DC | PRN

## 2013-01-12 NOTE — Telephone Encounter (Signed)
Message copied by Margaretmary Eddy on Tue Jan 12, 2013 11:43 AM ------      Message from: Phillips Odor      Created: Tue Jan 12, 2013 10:55 AM      Regarding: add-on to Rusty's schedule today       Please add to Rusty's schedule today at 3:00 PM; c/o burning pain in right foot toe amp site; "absolutely miserable". (Given appt. time already) ------

## 2013-01-12 NOTE — Progress Notes (Signed)
Subjective:     Patient ID: Graceson Nichelson, male   DOB: 12-Jun-1966, 47 y.o.   MRN: [id number]  HPI: 47 year old male patient of Dr. Darrick Penna who underwent amputation of the third and fourth digits of his right foot mid-January. Patient is overdue to get his sutures out and called asking to be seen due to pain in the surgical bed. The patient was brought in for evaluation. The patient states he missed a CT angiogram with runoff due to being in [place] for a family funeral. The patient also states he was getting his haircut and was stepped on causing more pain in the foot.   Review of Systems: 12 point review of systems is notable for the difficulties described above otherwise unremarkable     Objective:   Physical Exam: Afebrile, vital signs are stable, the wounds are well healed there is no redness no drainage the patient denies any fevers or problems other than pain.     Assessment:     Continued pain in right foot status post amputation of third and fourth digits    Plan:     I spoke with Dr. Darrick Penna who okayed, Tylenol #3, #30 with no refill, the patient understands he needs to have his CTA completed and return to see Dr. Darrick Penna in 2 weeks. The patient states that is scheduled for next week. The patient's questions were otherwise encouraged and answered, he is in agreement with this plan.     Lauree Chandler ANP  Clinic M.D.: Early

## 2013-01-15 ENCOUNTER — Encounter (HOSPITAL_COMMUNITY): Payer: Self-pay

## 2013-01-18 ENCOUNTER — Telehealth: Payer: Self-pay | Admitting: Family Medicine

## 2013-01-18 NOTE — Telephone Encounter (Signed)
Patient Information:  Caller Name: Donterrius  Phone: [phone]  Patient: Jose Petersen, Jose Petersen  Gender: Male  DOB: 13-Dec-1965  Age: 47 Years  PCP: Sheliah Hatch.  Office Follow Up:  Does the office need to follow up with this patient?: No  Instructions For The Office: N/A  RN Note:  Moderate to severe bilateral pain in feet and toes. Pain currently 6.5-7/10. FBS 125 at 0830. Feet appear pale but feel warm to touch. Amputation of two toes on right foot for poor circulation in last month.  Symptoms  Reason For Call & Symptoms: Severe bilateral peripheral neuropathy.  Burning sensation in toes and balls of feet; difficulty sleeping due to pain.  Cardiologist suggested follow up with PCP.  Occurred Nov-December 2013  Reviewed Health History In EMR: Yes  Reviewed Medications In EMR: Yes  Reviewed Allergies In EMR: Yes  Reviewed Surgeries / Procedures: Yes  Date of Onset of Symptoms: 11/01/2012  Treatments Tried: Ambien  Treatments Tried Worked: No  Guideline(s) Used:  Foot Pain  Disposition Per Guideline:   See Within 3 Days in Office  Reason For Disposition Reached:   Moderate pain (e.g., interferes with normal activities, limping) and present > 3 days  Advice Given:  Pain Medicines:  For pain relief, you can take either acetaminophen, ibuprofen, or naproxen.  Pain Medicines:  For pain relief, you can take either acetaminophen, ibuprofen, or naproxen.  Acetaminophen (e.g., Tylenol):  Regular Strength Tylenol: Take 650 mg (two 325 mg pills) by mouth every 4-6 hours as needed. Each Regular Strength Tylenol pill has 325 mg of acetaminophen.  Extra Strength Tylenol: Take 1,000 mg (two 500 mg pills) every 8 hours as needed. Each Extra Strength Tylenol pill has 500 mg of acetaminophen.  Call Back If:  Severe pain not relieved by pain medication  You become worse.  Call Back If:  Severe pain not relieved by pain medication  You become worse.  RN Overrode Recommendation:  Make  Appointment  RN advised to see MD 01/19/13 due to history and symptoms.  Appointment Scheduled:  01/19/2013 11:30:00 Appointment Scheduled Provider:  Sheliah Hatch.

## 2013-01-19 ENCOUNTER — Ambulatory Visit (INDEPENDENT_AMBULATORY_CARE_PROVIDER_SITE_OTHER): Payer: Self-pay | Admitting: Family Medicine

## 2013-01-19 ENCOUNTER — Encounter: Payer: Self-pay | Admitting: Family Medicine

## 2013-01-19 VITALS — BP 138/76 | HR 75 | Temp 98.1°F | Wt 213.0 lb

## 2013-01-19 DIAGNOSIS — E1142 Type 2 diabetes mellitus with diabetic polyneuropathy: Secondary | ICD-10-CM

## 2013-01-19 DIAGNOSIS — E1149 Type 2 diabetes mellitus with other diabetic neurological complication: Secondary | ICD-10-CM

## 2013-01-19 DIAGNOSIS — E1140 Type 2 diabetes mellitus with diabetic neuropathy, unspecified: Secondary | ICD-10-CM | POA: Insufficient documentation

## 2013-01-19 MED ORDER — GABAPENTIN 300 MG PO CAPS: 300.0000 mg | ORAL_CAPSULE | Freq: Three times a day (TID) | ORAL | Status: DC

## 2013-01-19 NOTE — Progress Notes (Signed)
  Subjective:    Patient ID: Jose Petersen, male    DOB: Nov 24, 1966, 47 y.o.   MRN: [id number]  HPI Bilateral foot pain- pain is 'all over' and will radiate into shin.  Tops and bottoms feel 'bruised and burning', very painful to walk on.  Pt feels that pain was masked by Oxycodone for recent amputation.  Since stopping narcotics pain has progressively worsened for 2-3 weeks.  Now waking him from sleep at night.  Painful to step on shower mat, have water touch his feet, putting socks on, just sitting w/out weight bearing.  Pt still taking T3 prn for pain.   Review of Systems For ROS see HPI     Objective:   Physical Exam  Vitals reviewed. Constitutional: He is oriented to person, place, and time. He appears well-developed and well-nourished. No distress.  Musculoskeletal:  R 3rd and 4th toes surgically removed w/ well healing surgical stumps Decreased pedal pulse on R, strong on L Decreased sensation to monofilament on L, normal on R  Neurological: He is alert and oriented to person, place, and time.  Skin: Skin is warm and dry. No erythema.          Assessment & Plan:

## 2013-01-19 NOTE — Patient Instructions (Addendum)
Keep your f/u appt w/ Dr Darrick Penna in 2 days Start the gabapentin 3x/day- this may initially cause drowsiness but you will adjust Call vascular regarding the pain meds since you have been taking it differently than prescribed Control solutions are specific for each meter and we don't have the Relion meters here in the office Call with any questions or concerns Hang in there!

## 2013-01-19 NOTE — Assessment & Plan Note (Signed)
New.  Pt's sxs and mildly decreased sensation to monofilament exam on L consistent w/ diabetic neuropathy.  Will start gabapentin.  Pt asked for refill on T3 b/c he is 'out'.  Med was just filled last week and pt was given 10 days worth of meds if taken as directed.  Pt admits to taking more than prescribed.  Will not refill at this time.  Pt instructed to ask vascular as this was who wrote original script.  Suspect that since pt has been on narcotics since the amputation of his toes in early Jan that he is physically dependent on medications and may be psychologically addicted.  Will follow.

## 2013-01-20 ENCOUNTER — Ambulatory Visit (HOSPITAL_COMMUNITY)
Admission: RE | Admit: 2013-01-20 | Discharge: 2013-01-20 | Disposition: A | Discharge status: Home / Self Care | Payer: Self-pay | Source: Ambulatory Visit | Attending: Internal Medicine | Admitting: Internal Medicine

## 2013-01-20 ENCOUNTER — Other Ambulatory Visit: Payer: Self-pay

## 2013-01-20 ENCOUNTER — Inpatient Hospital Stay: Admission: RE | Admit: 2013-01-20 | Payer: Self-pay | Source: Ambulatory Visit

## 2013-01-20 ENCOUNTER — Encounter: Payer: Self-pay | Admitting: Vascular Surgery

## 2013-01-20 VITALS — BP 140/88 | HR 70 | Wt 211.8 lb

## 2013-01-20 DIAGNOSIS — R82998 Other abnormal findings in urine: Secondary | ICD-10-CM

## 2013-01-20 DIAGNOSIS — I2510 Atherosclerotic heart disease of native coronary artery without angina pectoris: Secondary | ICD-10-CM | POA: Insufficient documentation

## 2013-01-20 DIAGNOSIS — I5022 Chronic systolic (congestive) heart failure: Secondary | ICD-10-CM | POA: Insufficient documentation

## 2013-01-20 DIAGNOSIS — R8290 Unspecified abnormal findings in urine: Secondary | ICD-10-CM

## 2013-01-20 MED ORDER — CARVEDILOL 6.25 MG PO TABS: 9.3750 mg | ORAL_TABLET | Freq: Two times a day (BID) | ORAL | Status: DC

## 2013-01-20 NOTE — Assessment & Plan Note (Signed)
No evidence of ischemia. Continue current regimen.   

## 2013-01-20 NOTE — Progress Notes (Signed)
Patient ID: Jose Petersen, male   DOB: 1965/12/31, 47 y.o.   MRN: [id number] Vascular Surgeon: Dr Darrick Penna PCP: Dr Beverely Low   Weight Range 224 pounds  Baseline proBNP     HPI: Jose Petersen is a 47 y/o male with h/o CAD, ICM (EF 15%), cardiac MRI 41%MI, CABG 2007, last cath 2009 with patent grafts, and S/P R 3rd 4th toe amputation 12/2012.  He is not on ace inhibitor due to elevated creatinine.  Admitted to [facility] 12/03/2012 with anasarca and 50 pound weight gain. Placed on Milrinone and Lasix drip He was admitted with anasarca. An ECHO was repeated and revealed EF 15% with RV dysfunction. Cardiac MRI was performed with EF back up to 41% and probable old MI with other areas of patchy uptake suggesting possible myocarditis (vs infiltrative CM). Urine IFE shows (looking for amyloid) : Area of slightly restricted mobility in the IgG and kappa lanes. May need to repeat in 6-8 months, if clinically indicated. 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 Vascular Surgeon will repeat CT 01/07/13. He was not placed on ace inhibitor due to elevated creatinine but will remain on hydralazine and IMDUR for afterload reduction. Creatinine improved at the time of discharge. Overall he diuresed 73 pounds. Discharge weight 224 pounds.   He returns for follow up. Denies SOB/PND/Orthopnea. He has started on Gabapentin last week by Dr Beverely Low. Appetite improved. Complaint with medications. Weight at home trending down 207-209 pounds. He plans to hold lasix if his weight is 205 or less. He has been walking without difficulty. Completed home health.      ROS: All systems negative except as listed in HPI, PMH and Problem List.  Past Medical History  Diagnosis Date  . Coronary artery disease     a. 4V CABG 2007 in [place], possible mild MI. b. Repeat cath ~2009-2010 pre-op knee surgery that was reportedly "clean" per pt.  . Hypertension   . Gout   . Diverticulitis   . Hyperlipidemia   . GI bleed     a. Anemia/GIB with bleeding duodenal ulcer 08/2012.  Marland Kitchen Myocardial infarction 2007    "small one before OHS" (12/03/2012)  . Type II diabetes mellitus 2013  . Anxiety     "over the last couple weeks" (12/03/2012)  . Kidney stones ~ 1989; 10/2012  . Iron deficiency anemia     a. GIB 08/2012.  . Cardiomyopathy     a. EF 45-50% 08/2012.    Current Outpatient Prescriptions  Medication Sig Dispense Refill  . Acetaminophen-Codeine 300-30 MG per tablet Take 1 tablet by mouth every 8 (eight) hours as needed for pain.  30 tablet  0  . atorvastatin (LIPITOR) 20 MG tablet Take 1 tablet (20 mg total) by mouth daily.  90 tablet  3  . bisacodyl (DULCOLAX) 5 MG EC tablet Take 1 tablet (5 mg total) by mouth daily as needed.  30 tablet  3  . carvedilol (COREG) 6.25 MG tablet Take 6.25 mg by mouth 2 (two) times daily with a meal.      . citalopram (CELEXA) 40 MG tablet Take 1 tablet (40 mg total) by mouth daily.  30 tablet  3  . docusate sodium 100 MG CAPS Take 100 mg by mouth 2 (two) times daily as needed for constipation.  30 capsule  2  . ferrous gluconate (FERGON) 216 MG tablet Take 1 tablet (216 mg total) by mouth 3 (three) times daily with meals.  90 tablet  1  .  furosemide (LASIX) 40 MG tablet Take 1 tablet (40 mg total) by mouth daily.  30 tablet  3  . gabapentin (NEURONTIN) 300 MG capsule Take 1 capsule (300 mg total) by mouth 3 (three) times daily.  90 capsule  3  . glucose blood test strip Use as instructed  100 each  12  . glucose monitoring kit (FREESTYLE) monitoring kit 1 each by Does not apply route as needed for other.  1 each  0  . hydrALAZINE (APRESOLINE) 25 MG tablet Take 3 tablets (75 mg total) by mouth every 8 (eight) hours.  90 tablet  3  . HYDROcodone-acetaminophen (NORCO) 10-325 MG per tablet Take 1-2 tabs every 6 hr./ prn  60 tablet  0  . isosorbide mononitrate (IMDUR) 30 MG 24 hr tablet Take 1 tablet (30 mg total) by mouth daily.  30 tablet  3  . Lancets (FREESTYLE) lancets Use as  instructed  100 each  12  . metFORMIN (GLUCOPHAGE) 500 MG tablet Take 1 tablet (500 mg total) by mouth 2 (two) times daily with a meal.  60 tablet  1  . potassium chloride SA (K-DUR,KLOR-CON) 20 MEQ tablet Take 1 tablet (20 mEq total) by mouth daily.  30 tablet  3  . zolpidem (AMBIEN) 5 MG tablet Take 1 tablet (5 mg total) by mouth at bedtime as needed for sleep.  30 tablet  1   No current facility-administered medications for this encounter.     PHYSICAL EXAM: Filed Vitals:   01/20/13 1146  BP: 140/88  Pulse: 70  Weight: 211 lb 12 oz (96.049 kg)  SpO2: 98%    General:  Well appearing. No resp difficulty Mom present HEENT: normal Neck: supple. JVP flat. Carotids 2+ bilaterally; no bruits. No lymphadenopathy or thryomegaly appreciated. Cor: PMI normal. Regular rate & rhythm. No rubs, gallops or murmurs. Lungs: clear Abdomen: soft, nontender, nondistended. No hepatosplenomegaly. No bruits or masses. Good bowel sounds. Extremities: no cyanosis, clubbing, rash, edema Neuro: alert & orientedx3, cranial nerves grossly intact. Moves all 4 extremities w/o difficulty. Affect pleasant.      ASSESSMENT & PLAN:

## 2013-01-20 NOTE — Patient Instructions (Addendum)
Take carvedilol 9.375 mg twice a day  Hold lasix if your weight is 205 pounds or less. If lasix is held please hold potassium   Follow up in 1 month with Dr Gala Romney  Do the following things EVERYDAY: 1) Weigh yourself in the morning before breakfast. Write it down and keep it in a log. 2) Take your medicines as prescribed 3) Eat low salt foods-Limit salt (sodium) to 2000 mg per day.  4) Stay as active as you can everyday 5) Limit all fluids for the day to less than 2 liters

## 2013-01-20 NOTE — Assessment & Plan Note (Signed)
Urine IFE shows (looking for amyloid) : Area of slightly restricted mobility in the IgG and kappa lanes. Refer to hematology to further assess and rule out myeloma.

## 2013-01-20 NOTE — Assessment & Plan Note (Signed)
NYHA II. Volume status stable. Weight continues to trend down. He is instructed to hold lasix if his weight is < 206 pounds. Increase carvedilol 9.375 mg twice a day. Reinforced low salt food choices, limiting fluid intake < 2 liters, and daily weights. Follow up in 1 month.

## 2013-01-21 ENCOUNTER — Ambulatory Visit: Payer: Self-pay | Admitting: Vascular Surgery

## 2013-01-21 ENCOUNTER — Telehealth: Payer: Self-pay | Admitting: Oncology

## 2013-01-21 NOTE — Telephone Encounter (Signed)
S/W pt in re NP appt 03/19 @ 10:30 w/Dr. Gaylyn Rong.  Referring Dr. Gala Romney Dx- Abn urine IFE, exclude myeloma Welcome packet mailed.

## 2013-01-22 ENCOUNTER — Telehealth: Payer: Self-pay | Admitting: Oncology

## 2013-01-22 NOTE — Telephone Encounter (Signed)
C/D 01/22/13 for appt. 02/17/13 °

## 2013-01-29 ENCOUNTER — Encounter: Payer: Self-pay | Admitting: Lab

## 2013-01-29 ENCOUNTER — Telehealth: Payer: Self-pay | Admitting: *Deleted

## 2013-01-29 NOTE — Telephone Encounter (Signed)
Discuss with patient  

## 2013-01-29 NOTE — Telephone Encounter (Signed)
Pt states that he thinks that he is having a gout attack and would like to know if it would be ok to take colchicine along with the NEURONTIN he is currently taking. Pt notes that colchicine was given to him by Dr Gala Romney.Please advise

## 2013-01-29 NOTE — Telephone Encounter (Signed)
Yes he can take both

## 2013-02-01 ENCOUNTER — Ambulatory Visit: Payer: Self-pay | Admitting: Family Medicine

## 2013-02-11 ENCOUNTER — Ambulatory Visit (HOSPITAL_COMMUNITY): Payer: Self-pay | Attending: Internal Medicine

## 2013-02-17 ENCOUNTER — Encounter: Payer: Self-pay | Admitting: Oncology

## 2013-02-17 ENCOUNTER — Other Ambulatory Visit: Payer: Self-pay | Admitting: Lab

## 2013-02-17 ENCOUNTER — Ambulatory Visit (HOSPITAL_COMMUNITY)
Admission: RE | Admit: 2013-02-17 | Discharge: 2013-02-17 | Disposition: A | Discharge status: Home / Self Care | Payer: Self-pay | Source: Ambulatory Visit | Attending: Oncology | Admitting: Oncology

## 2013-02-17 ENCOUNTER — Ambulatory Visit (HOSPITAL_BASED_OUTPATIENT_CLINIC_OR_DEPARTMENT_OTHER): Payer: Self-pay | Admitting: Oncology

## 2013-02-17 ENCOUNTER — Telehealth: Payer: Self-pay | Admitting: Oncology

## 2013-02-17 ENCOUNTER — Ambulatory Visit: Payer: Self-pay

## 2013-02-17 VITALS — BP 136/87 | HR 62 | Temp 97.0°F | Resp 20 | Ht 69.0 in | Wt 229.5 lb

## 2013-02-17 DIAGNOSIS — M479 Spondylosis, unspecified: Secondary | ICD-10-CM | POA: Insufficient documentation

## 2013-02-17 DIAGNOSIS — I509 Heart failure, unspecified: Secondary | ICD-10-CM

## 2013-02-17 DIAGNOSIS — R809 Proteinuria, unspecified: Secondary | ICD-10-CM

## 2013-02-17 DIAGNOSIS — M109 Gout, unspecified: Secondary | ICD-10-CM

## 2013-02-17 DIAGNOSIS — E1142 Type 2 diabetes mellitus with diabetic polyneuropathy: Secondary | ICD-10-CM

## 2013-02-17 NOTE — Progress Notes (Signed)
[facility]  Telephone:([phone]) [phone] [phone]     INITIAL HEMATOLOGY CONSULTATION    Referral MD:  Dr. Harolyn Rutherford, M.D.  Reason for Referral: positive UPEP.   HPI:  Jose Petersen is a 47 year-old man with multiple medical issues including gout, CHF.  Given his slight worsening in renal function in 12/2012, Dr. Kittie Plater sent for spot UPEP which showed slightly elevated kappa light chain at 3.32 mg/dL with immunofixation showing an area of slightly restricted mobility in the kappa lane.  There was no proteinuria.  It was repeated again on 12/09/2012 where the kappa light chain now decreased to 0.6 mg/dL and there was no monoclonal free light chains this time. Urine IFE shows polyclonal increase in free Kappa and/or free Lambda light chains.  He was kindly referred to the [facility] for evaluation.    Jose Petersen presented to the clinic for the first time today with his mother.  He reported left ankle and left toe pain.  He has been taking Colchicine.  The pain was severe 10/10; and improved to mild with Colchicine.  There was slight erythema of the toe/ankle.  He denied skin breakthrough.  He has no problem ambulating despite the pain.  He does have mild numbness and tingling of his feet and hands due to DM.  He has no problem with daily activities despite neuropathy.    Patient denies fever, anorexia, weight loss, fatigue, headache, visual changes, confusion, drenching night sweats, palpable lymph node swelling, mucositis, odynophagia, dysphagia, nausea vomiting, jaundice, chest pain, palpitation, shortness of breath, dyspnea on exertion, productive cough, gum bleeding, epistaxis, hematemesis, hemoptysis, abdominal pain, abdominal swelling, early satiety, melena, hematochezia, hematuria, skin rash, spontaneous bleeding,heat or cold intolerance, bowel bladder incontinence, back pain, focal motor weakness, depression.      Past Medical History  Diagnosis  Date  . Coronary artery disease     a. 4V CABG 2007 in [place], possible mild MI. b. Repeat cath ~2009-2010 pre-op knee surgery that was reportedly "clean" per pt.  . Hypertension   . Gout   . Diverticulitis   . Hyperlipidemia   . GI bleed 2013    Anemia/GIB with bleeding duodenal ulcer 08/2012.  Marland Kitchen Myocardial infarction 2007    "small one before OHS" (12/03/2012)  . Type II diabetes mellitus 2013  . Anxiety     "over the last couple weeks" (12/03/2012)  . Kidney stones ~ 1989; 10/2012  . Iron deficiency anemia     a. GIB 08/2012.  . Cardiomyopathy     a. EF 45-50% 08/2012.  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 Coronary artery bypass graft  2007    CABG X 4  . Knee cartilage surgery  2009    "left" (12/03/2012)  . Esophagogastroduodenoscopy  08/28/2012    Procedure: ESOPHAGOGASTRODUODENOSCOPY (EGD);  Surgeon: Willis Modena, MD;  Location: [facility];  Service: Endoscopy;  Laterality: N/A;  . Tonsillectomy  1960's  . Knee arthroscopy  1990's-2000's    "3 on the left; all arthoscopies for meninscus tear" (12/03/2012)  . Cardiac catheterization  ~ 2009  . Cystoscopy/retrograde/ureteroscopy/stone extraction with basket  ?1989  . Cystoscopy with retrograde pyelogram, ureteroscopy and stent placement  10/2012  . Amputation  12/14/2012    Procedure: AMPUTATION DIGIT;  Surgeon: Sherren Kerns, MD;  Location: [facility] OR;  Service: Vascular;  Laterality: Right;  Amputation of  3rd & 4th toes of right foot   :   CURRENT MEDS: Current Outpatient Prescriptions  Medication Sig Dispense Refill  . atorvastatin (LIPITOR) 20 MG tablet Take 1 tablet (20 mg total) by mouth daily.  90 tablet  3  . bisacodyl (DULCOLAX) 5 MG EC tablet Take 1 tablet (5 mg total) by mouth daily as needed.  30 tablet  3  . carvedilol (COREG) 6.25 MG tablet Take 1.5 tablets (9.375 mg total) by mouth 2 (two) times daily with a meal.  90 tablet  3  . citalopram (CELEXA) 40 MG  tablet Take 1 tablet (40 mg total) by mouth daily.  30 tablet  3  . docusate sodium 100 MG CAPS Take 100 mg by mouth 2 (two) times daily as needed for constipation.  30 capsule  2  . ferrous gluconate (FERGON) 216 MG tablet Take 1 tablet (216 mg total) by mouth 3 (three) times daily with meals.  90 tablet  1  . furosemide (LASIX) 40 MG tablet Take 1 tablet (40 mg total) by mouth daily.  30 tablet  3  . gabapentin (NEURONTIN) 300 MG capsule Take 1 capsule (300 mg total) by mouth 3 (three) times daily.  90 capsule  3  . glucose blood test strip Use as instructed  100 each  12  . glucose monitoring kit (FREESTYLE) monitoring kit 1 each by Does not apply route as needed for other.  1 each  0  . hydrALAZINE (APRESOLINE) 25 MG tablet Take 3 tablets (75 mg total) by mouth every 8 (eight) hours.  90 tablet  3  . isosorbide mononitrate (IMDUR) 30 MG 24 hr tablet Take 1 tablet (30 mg total) by mouth daily.  30 tablet  3  . Lancets (FREESTYLE) lancets Use as instructed  100 each  12  . metFORMIN (GLUCOPHAGE) 500 MG tablet Take 1 tablet (500 mg total) by mouth 2 (two) times daily with a meal.  60 tablet  1  . potassium chloride SA (K-DUR,KLOR-CON) 20 MEQ tablet Take 1 tablet (20 mEq total) by mouth daily.  30 tablet  3  . zolpidem (AMBIEN) 5 MG tablet Take 1 tablet (5 mg total) by mouth at bedtime as needed for sleep.  30 tablet  1   No current facility-administered medications for this visit.      Allergies  Allergen Reactions  . Toradol (Ketorolac Tromethamine) Hives  . Motrin (Ibuprofen) Other (See Comments)    Pt has ulcer  :  Family History  Problem Relation Age of Onset  . COPD Father   . Heart disease Father   . Cancer Father     bladder cancer  . High blood pressure Sister   . Cancer Maternal Uncle     leukemia, unk type  :  History   Social History  . Marital Status: Divorced    Spouse Name: N/A    Number of Children: 0  . Years of Education: N/A   Occupational History  .       medical billing   Social History Main Topics  . Smoking status: Former Smoker -- 1.00 packs/day for 24 years    Types: Cigarettes    Quit date: 12/03/2003  . Smokeless tobacco: Never Used  . Alcohol Use: No     Comment: 1/2/2014occasional social, 1 drink per year "if that"  . Drug Use: No  . Sexually Active: Not Currently   Other Topics Concern  . Not on file   Social History  Narrative   Lives in a house without stairs.  Lives with his mom to be with her when his father got sick to help with the house.  He does not use a cane or walker and drives.  He previously worked out a [employer] about 6 days per week until a month ago.   Has insurance.  His primary care office is [facility].  Dr. Jens Som is Cardiologist.    :  REVIEW OF SYSTEM:  The rest of the 14-point review of sytem was negative.   Exam: ECOG 0.   General:  well-nourished man, in no acute distress.  Eyes:  no scleral icterus.  ENT:  There were no oropharyngeal lesions.  Neck was without thyromegaly.  Lymphatics:  Negative cervical, supraclavicular or axillary adenopathy.  Respiratory: lungs were clear bilaterally without wheezing or crackles.  Cardiovascular:  Regular rate and rhythm, S1/S2, without murmur, rub or gallop.  There was no pedal edema.  GI:  abdomen was soft, flat, nontender, nondistended, without organomegaly.  Muscoloskeletal:  no spinal tenderness of palpation of vertebral spine.  There was mid redness, increased in warmth, and tenderness to palpation of left big toe and left ankle.  There was no skin breakthrough.  Skin exam was without echymosis, petichae.  Neuro exam was nonfocal.  Patient was able to get on and off exam table without assistance.  Gait was normal.  Patient was alerted and oriented.  Attention was good.   Language was appropriate.  Mood was normal without depression.  Speech was not pressured.  Thought content was not tangential.    LABS:  Lab Results  Component Value Date    WBC 8.5 01/06/2013   HGB 13.4 01/06/2013   HCT 41.1 01/06/2013   PLT 341 01/06/2013   GLUCOSE 113* 01/06/2013   CHOL 152 08/29/2012   TRIG 192* 08/29/2012   HDL 19* 08/29/2012   LDLCALC 95 08/29/2012   ALT 15 12/09/2012   AST 23 12/09/2012   NA 135 01/06/2013   K 3.8 01/06/2013   CL 98 01/06/2013   CREATININE 1.03 01/06/2013   BUN 17 01/06/2013   CO2 22 01/06/2013   INR 1.13 08/28/2012   HGBA1C 7.0* 12/31/2012    Dg Bone Survey Met  02/17/2013  *RADIOLOGY REPORT*  Clinical Data: Positive monoclonal protein.  METASTATIC BONE SURVEY  Comparison: None.  Findings: There are no lytic lesions in the visualized portion of the skeleton.  Minimal degenerative changes of the hips and in the spine.  IMPRESSION: No evidence of multiple myeloma.   Original Report Authenticated By: Francene Boyers, M.D.       ASSESSMENT AND PLAN:   1.  CHF:  Euvolemic on exam today. On Lasix, carvedilol, Imdur.  2.  History of renal insufficiency:  Resolved now.  Most likely from HTN, DM.  3.  Acute gout attack:  On colchicine.  4.  DM, type II:   On metformin. 5.  HTN:  On carvedilol, lasix, Imdur.  6.  HLP:  On atorvastatin. 7.  Neuropathy:  Due to DM.  He is on Neurontin.  8. Abnormal protein in the urine. - Possibilities:  Reactive process to gout.  Need to rule out myeloma and related disease.   - Work up:  Lab test, skeletal xray, and possibly bone marrow biopsy depending on findings.  If skeletal Xray is negative and no Mspike on SPEP or serum light chain or BJ protein on 24 hour urine collection, then we will  assume that he had reactive process causing one time protein spike and self resolved.   4.  Follow up:  After work up to discuss if there is need for treatment.   Mr. Herendeen and his mother expressed informed understanding and wished to proceed as recommended.     The length of time of the face-to-face encounter was 45 minutes. More than 50% of time was spent counseling and coordination of care.     Thank you for  this referral.

## 2013-02-17 NOTE — Progress Notes (Signed)
Checked in new pt with no insurance and currently has no income.  He has applied for Medicaid 10 days ago and is waiting for a reply.  I gave pt an EPP application and explained the financial assistance program with him.  I will process application once returned with the needed info.

## 2013-02-17 NOTE — Telephone Encounter (Signed)
Gave pt appt for April 2014 MD only, pt sent to x-ray today for bone Survey

## 2013-02-17 NOTE — Patient Instructions (Addendum)
1.  Issue:  Abnormal protein in the urine. 2.  Possibilities:  Reactive process.  Need to rule out myeloma and related disease.  There are slow growing cancer of the plasma cell in the bone marrow which need to be treated with chemo. 3.  Work up:  Lab test, skeletal xray, and possibly bone marrow biopsy depending on findings.  4.  Follow up:  After work up to discuss if there is need for treatment.

## 2013-02-26 ENCOUNTER — Encounter: Payer: Self-pay | Admitting: Oncology

## 2013-02-26 LAB — UIFE/LIGHT CHAINS/TP QN, 24-HR UR
Alpha 1, Urine: DETECTED — AB
Alpha 2, Urine: DETECTED — AB
Free Kappa/Lambda Ratio: 3.59 ratio (ref 2.04–10.37)
Free Lt Chn Excr Rate: 29.1 mg/d
Total Protein, Urine-Ur/day: 368 mg/d — ABNORMAL HIGH (ref 10–140)
Total Protein, Urine: 24.5 mg/dL

## 2013-03-01 ENCOUNTER — Telehealth: Payer: Self-pay | Admitting: Family Medicine

## 2013-03-01 ENCOUNTER — Other Ambulatory Visit: Payer: Self-pay | Admitting: Family Medicine

## 2013-03-01 NOTE — Telephone Encounter (Signed)
Ok to refill? Last OV 2.18.14 Last filled 1.30.14

## 2013-03-01 NOTE — Telephone Encounter (Signed)
Refill- metformin 500mg  tab. Take one tablet by mouth twice daily with meals. Qty 60 last fill 2.11.14

## 2013-03-02 MED ORDER — METFORMIN HCL 500 MG PO TABS: 500.0000 mg | ORAL_TABLET | Freq: Two times a day (BID) | ORAL | Status: AC

## 2013-03-02 NOTE — Telephone Encounter (Signed)
Rx sent to the pharmacy by e-script.//AB/CMA 

## 2013-03-05 ENCOUNTER — Encounter (HOSPITAL_COMMUNITY): Payer: Self-pay

## 2013-03-05 ENCOUNTER — Ambulatory Visit (HOSPITAL_COMMUNITY)
Admission: RE | Admit: 2013-03-05 | Discharge: 2013-03-05 | Disposition: A | Discharge status: Home / Self Care | Payer: Self-pay | Source: Ambulatory Visit | Attending: Internal Medicine | Admitting: Internal Medicine

## 2013-03-05 VITALS — BP 140/90 | HR 63 | Wt 234.0 lb

## 2013-03-05 DIAGNOSIS — I5022 Chronic systolic (congestive) heart failure: Secondary | ICD-10-CM

## 2013-03-05 DIAGNOSIS — I10 Essential (primary) hypertension: Secondary | ICD-10-CM | POA: Insufficient documentation

## 2013-03-05 MED ORDER — HYDRALAZINE HCL 100 MG PO TABS: 100.0000 mg | ORAL_TABLET | Freq: Three times a day (TID) | ORAL | Status: AC

## 2013-03-05 NOTE — Patient Instructions (Addendum)
Take hydralazine 100 mg three time a day  Follow up in 1 month with an ECHO  Do the following things EVERYDAY: 1) Weigh yourself in the morning before breakfast. Write it down and keep it in a log. 2) Take your medicines as prescribed 3) Eat low salt foods-Limit salt (sodium) to 2000 mg per day.  4) Stay as active as you can everyday 5) Limit all fluids for the day to less than 2 liters

## 2013-03-05 NOTE — Assessment & Plan Note (Signed)
NYHA II. Functionally he continues to improve and he is able to work out 6 days a week.  Volume status stable despite weight gain. Increase hydralazine to 100 mg tid. Reinforced portion control, low salt food choices, and dialy weights. Follow up in 1 month with Dr Gala Romney and an ECHO.

## 2013-03-05 NOTE — Assessment & Plan Note (Signed)
SBP > 130. Increase hydralazine to 100 mg tid.

## 2013-03-05 NOTE — Progress Notes (Signed)
Patient ID: Jose Petersen, male   DOB: 24-Sep-1966, 47 y.o.   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  He is not on ace inhibitor due to elevated creatinine.  Admitted to [facility] 12/03/2012 with anasarca and 50 pound weight gain. Placed on Milrinone and Lasix drip He was admitted with anasarca. An ECHO was repeated and revealed EF 15% with RV dysfunction. Cardiac MRI was performed with EF back up to 41% and probable old MI with other areas of patchy uptake suggesting possible myocarditis (vs infiltrative CM). Urine IFE shows (looking for amyloid) : Area of slightly restricted mobility in the IgG and kappa lanes. May need to repeat in 6-8 months, if clinically indicated. 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 Vascular Surgeon will repeat CT 01/07/13. He was not placed on ace inhibitor due to elevated creatinine but will remain on hydralazine and IMDUR for afterload reduction. Creatinine improved at the time of discharge. Overall he diuresed 73 pounds. Discharge weight 224 pounds.    02/17/13- Bone Survey - No evidence of myeloma  He returns for follow up. Last visit carvedilol increased to 9.375 mg twice daily. Denies SOB/PND/Orthopnea. Weight at home 212-213 pounds. He has returned to [facility] and he is working out 6 days a week. Swimming laps at the pool.(able to swim 35 laps at a time). Complains of fatiuge after working out. Compliant with medications. Great appetite. Trying to follow low salt diet.       ROS: All systems negative except as listed in HPI, PMH and Problem List.  Past Medical History  Diagnosis Date  . Coronary artery disease     a. 4V CABG 2007 in [place], possible mild MI. b. Repeat cath  ~2009-2010 pre-op knee surgery that was reportedly "clean" per pt.  . Hypertension   . Gout   . Diverticulitis   . Hyperlipidemia   . GI bleed 2013    Anemia/GIB with bleeding duodenal ulcer 08/2012.  Marland Kitchen Myocardial infarction 2007    "small one before OHS" (12/03/2012)  . Type II diabetes mellitus 2013  . Anxiety     "over the last couple weeks" (12/03/2012)  . Kidney stones ~ 1989; 10/2012  . Iron deficiency anemia     a. GIB 08/2012.  . Cardiomyopathy     a. EF 45-50% 08/2012.  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 atorvastatin (LIPITOR) 20 MG tablet Take 1 tablet (20 mg total) by mouth daily.  90 tablet  3  . bisacodyl (DULCOLAX) 5 MG EC tablet Take 1 tablet (5 mg total) by mouth daily as needed.  30 tablet  3  . carvedilol (COREG) 6.25 MG tablet Take 1.5 tablets (9.375 mg total) by mouth 2 (two) times daily with a meal.  90 tablet  3  . citalopram (CELEXA) 40 MG tablet Take 1 tablet (40 mg total) by mouth daily.  30 tablet  3  . docusate sodium 100 MG CAPS Take 100 mg by mouth 2 (two) times daily as needed for constipation.  30 capsule  2  . ferrous gluconate (FERGON) 216 MG tablet  Take 1 tablet (216 mg total) by mouth 3 (three) times daily with meals.  90 tablet  1  . furosemide (LASIX) 40 MG tablet Take 1 tablet (40 mg total) by mouth daily.  30 tablet  3  . gabapentin (NEURONTIN) 300 MG capsule Take 1 capsule (300 mg total) by mouth 3 (three) times daily.  90 capsule  3  . glucose blood test strip Use as instructed  100 each  12  . glucose monitoring kit (FREESTYLE) monitoring kit 1 each by Does not apply route as needed for other.  1 each  0  . hydrALAZINE (APRESOLINE) 25 MG tablet Take 3 tablets (75 mg total) by mouth every 8 (eight) hours.  90 tablet  3  . isosorbide mononitrate (IMDUR) 30 MG 24 hr tablet Take 1 tablet (30 mg total) by mouth daily.  30 tablet  3  . Lancets (FREESTYLE) lancets Use as instructed  100  each  12  . metFORMIN (GLUCOPHAGE) 500 MG tablet Take 1 tablet (500 mg total) by mouth 2 (two) times daily with a meal.  60 tablet  3  . potassium chloride SA (K-DUR,KLOR-CON) 20 MEQ tablet Take 1 tablet (20 mEq total) by mouth daily.  30 tablet  3  . zolpidem (AMBIEN) 5 MG tablet TAKE ONE TABLET BY MOUTH AT BEDTIME AS NEEDED FOR SLEEP  30 tablet  0   No current facility-administered medications for this encounter.     PHYSICAL EXAM: Filed Vitals:   03/05/13 0829  BP: 140/90  Pulse: 63  Weight: 234 lb (106.142 kg)  SpO2: 97%    General:  Well appearing. No resp difficulty Mom present HEENT: normal Neck: supple. JVP flat. Carotids 2+ bilaterally; no bruits. No lymphadenopathy or thryomegaly appreciated. Cor: PMI normal. Regular rate & rhythm. No rubs, gallops or murmurs. Lungs: clear Abdomen: soft, nontender, nondistended. No hepatosplenomegaly. No bruits or masses. Good bowel sounds. Extremities: no cyanosis, clubbing, rash, edema Neuro: alert & orientedx3, cranial nerves grossly intact. Moves all 4 extremities w/o difficulty. Affect pleasant.      ASSESSMENT & PLAN:

## 2013-03-08 ENCOUNTER — Other Ambulatory Visit: Payer: Self-pay | Admitting: Oncology

## 2013-03-09 ENCOUNTER — Telehealth: Payer: Self-pay | Admitting: Oncology

## 2013-03-09 NOTE — Telephone Encounter (Signed)
per Dr. Gaylyn Rong cancel all appts...pt discharged from cancer center...do not cal

## 2013-03-10 ENCOUNTER — Ambulatory Visit: Payer: Self-pay | Admitting: Oncology

## 2013-03-10 ENCOUNTER — Telehealth: Payer: Self-pay | Admitting: Family Medicine

## 2013-03-10 NOTE — Telephone Encounter (Signed)
Appointment Scheduled:  03/11/2013 09:45:00  Appointment Scheduled Provider:  Sheliah Hatch

## 2013-03-10 NOTE — Telephone Encounter (Signed)
Patient Information:  Caller Name: [name]  Phone: [phone]  Patient: Jose Petersen, Jose Petersen  Gender: Male  DOB: 1966/03/27  Age: 47 Years  PCP: Sheliah Hatch.  Office Follow Up:  Does the office need to follow up with this patient?: No  Instructions For The Office: N/A  RN Note:  pt rates pain as moderate - severe  Symptoms  Reason For Call & Symptoms: diabetic foot pain; pt reports every week since beginning taking the Gabepentin (01/19/13) he has had at least 2 days where the medication does not help.  Pt states this time it has been 3 days with no relief  Reviewed Health History In EMR: Yes  Reviewed Medications In EMR: Yes  Reviewed Allergies In EMR: Yes  Reviewed Surgeries / Procedures: Yes  Date of Onset of Symptoms: Unknown  Treatments Tried: gabapentin  Treatments Tried Worked: No  Guideline(s) Used:  Foot Pain  Disposition Per Guideline:   See Within 3 Days in Office  Reason For Disposition Reached:   Moderate pain (e.g., interferes with normal activities, limping) and present > 3 days  Advice Given:  Aggravating Factors   Shoes: Poorly fitting or overly tight shoes are the underlying cause of many painful foot conditions. High heels are bad for feet.  Pain Medicines:  For pain relief, you can take either acetaminophen, ibuprofen, or naproxen.  Extra Notes :  Caution: Do not take ibuprofen if you have stomach problems, kidney disease, are pregnant, or have been told by your doctor to avoid this type of anti-inflammatory drug. Do not take ibuprofen for more than 7 days without consulting your doctor.  Call Back If:  You become worse.  Patient Will Follow Care Advice:  YES  Appointment Scheduled:  03/11/2013 09:45:00 Appointment Scheduled Provider:  Sheliah Hatch.

## 2013-03-11 ENCOUNTER — Encounter: Payer: Self-pay | Admitting: Family Medicine

## 2013-03-11 ENCOUNTER — Ambulatory Visit (INDEPENDENT_AMBULATORY_CARE_PROVIDER_SITE_OTHER): Payer: Self-pay | Admitting: Family Medicine

## 2013-03-11 ENCOUNTER — Encounter: Payer: Self-pay | Admitting: Lab

## 2013-03-11 VITALS — BP 130/90 | HR 64 | Temp 97.4°F | Ht 70.0 in | Wt 239.4 lb

## 2013-03-11 DIAGNOSIS — E1149 Type 2 diabetes mellitus with other diabetic neurological complication: Secondary | ICD-10-CM

## 2013-03-11 DIAGNOSIS — E1142 Type 2 diabetes mellitus with diabetic polyneuropathy: Secondary | ICD-10-CM

## 2013-03-11 DIAGNOSIS — E1140 Type 2 diabetes mellitus with diabetic neuropathy, unspecified: Secondary | ICD-10-CM

## 2013-03-11 MED ORDER — GABAPENTIN 300 MG PO CAPS: ORAL_CAPSULE | ORAL | Status: DC

## 2013-03-11 NOTE — Progress Notes (Signed)
  Subjective:    Patient ID: Jose Petersen, male    DOB: 01-Feb-1966, 47 y.o.   MRN: [id number]  HPI Diabetic neuropathy- pt reports that a few weeks ago was getting substantial relief from gabapentin but 'i'm now on day 4 of no relief'.  Having constant pain regardless of position or activity.  'my standard of life right now revolves around my feet'.  Sugars are now under control.   Review of Systems For ROS see HPI     Objective:   Physical Exam  Constitutional: He appears well-developed and well-nourished. No distress.  Cardiovascular: Intact distal pulses.   Musculoskeletal:  No edema of LEs bilaterally DP pulses intact No wounds present Pt reports pain w/ palpation of feet but expression did not change during exam.  Psychiatric:  Flat affect          Assessment & Plan:

## 2013-03-11 NOTE — Assessment & Plan Note (Signed)
Deteriorated.  Pt reports pain is now constant and interfering w/ daily activities.  Titrate neurontin.  If no improvement will need to see neuro or pain management.  Pt expressed understanding and is in agreement w/ plan.

## 2013-03-11 NOTE — Patient Instructions (Addendum)
Increase gabapentin to 2 tabs 3x/day If no improvement in the next week- please call and we'll determine next steps (neurology vs pain management) Continue to have good sugar control Call with any questions or concerns Hang in there!

## 2013-03-29 ENCOUNTER — Other Ambulatory Visit: Payer: Self-pay | Admitting: Family Medicine

## 2013-03-30 NOTE — Telephone Encounter (Signed)
Pt last seen on 4/10 and med last filled on 3/31 #30 with 0 refills. Please advise.

## 2013-03-31 ENCOUNTER — Other Ambulatory Visit: Payer: Self-pay | Admitting: Family Medicine

## 2013-04-01 ENCOUNTER — Telehealth: Payer: Self-pay | Admitting: General Practice

## 2013-04-01 DIAGNOSIS — E1140 Type 2 diabetes mellitus with diabetic neuropathy, unspecified: Secondary | ICD-10-CM

## 2013-04-01 NOTE — Telephone Encounter (Signed)
Pt called the office for a referral to neurology due to his diabetic neuropathy. Per Beverely Low this is ok. Referral placed.

## 2013-04-02 NOTE — Telephone Encounter (Signed)
Last refill:03-02-13 Last OV:03-11-13 Please advise.//AB/CMA

## 2013-04-05 ENCOUNTER — Ambulatory Visit (HOSPITAL_BASED_OUTPATIENT_CLINIC_OR_DEPARTMENT_OTHER)
Admission: RE | Admit: 2013-04-05 | Discharge: 2013-04-05 | Disposition: A | Discharge status: Home / Self Care | Payer: Self-pay | Source: Ambulatory Visit | Attending: Internal Medicine | Admitting: Internal Medicine

## 2013-04-05 ENCOUNTER — Encounter (HOSPITAL_COMMUNITY): Payer: Self-pay

## 2013-04-05 ENCOUNTER — Ambulatory Visit (HOSPITAL_COMMUNITY)
Admission: RE | Admit: 2013-04-05 | Discharge: 2013-04-05 | Disposition: A | Discharge status: Home / Self Care | Payer: Self-pay | Source: Ambulatory Visit | Attending: Internal Medicine | Admitting: Internal Medicine

## 2013-04-05 VITALS — BP 150/86 | HR 98 | Wt 242.1 lb

## 2013-04-05 DIAGNOSIS — I5022 Chronic systolic (congestive) heart failure: Secondary | ICD-10-CM

## 2013-04-05 DIAGNOSIS — I10 Essential (primary) hypertension: Secondary | ICD-10-CM | POA: Insufficient documentation

## 2013-04-05 DIAGNOSIS — I252 Old myocardial infarction: Secondary | ICD-10-CM | POA: Insufficient documentation

## 2013-04-05 DIAGNOSIS — I428 Other cardiomyopathies: Secondary | ICD-10-CM | POA: Insufficient documentation

## 2013-04-05 DIAGNOSIS — I2510 Atherosclerotic heart disease of native coronary artery without angina pectoris: Secondary | ICD-10-CM

## 2013-04-05 DIAGNOSIS — E119 Type 2 diabetes mellitus without complications: Secondary | ICD-10-CM | POA: Insufficient documentation

## 2013-04-05 DIAGNOSIS — I509 Heart failure, unspecified: Secondary | ICD-10-CM | POA: Insufficient documentation

## 2013-04-05 DIAGNOSIS — E785 Hyperlipidemia, unspecified: Secondary | ICD-10-CM | POA: Insufficient documentation

## 2013-04-05 DIAGNOSIS — I517 Cardiomegaly: Secondary | ICD-10-CM

## 2013-04-05 MED ORDER — CARVEDILOL 6.25 MG PO TABS: 12.5000 mg | ORAL_TABLET | Freq: Two times a day (BID) | ORAL | Status: DC

## 2013-04-05 NOTE — Assessment & Plan Note (Addendum)
EF remains stable.  Will titrate carvedilol 12.5 mg BID.  Have encouraged him to continue exercising as well as low sodium diet and fluid restrictions.    Patient seen and examined with Jose Blossom, PA-C. We discussed all aspects of the encounter. I agree with the assessment and plan as stated above.  He is doing great. NYHA I-II. Volume status stable. I looked at his echo personally today and felt EF ~35%. However, formally read as 25-30%. We will titrate up carvedilol. Given EF < 35% will need to revisit possibility of ICD. Reinforced need for daily weights and reviewed use of sliding scale diuretics.

## 2013-04-05 NOTE — Progress Notes (Addendum)
Vascular Surgeon: Dr Darrick Penna PCP: Dr Beverely Low Hematology/[place]: Dr Gaylyn Rong   Weight Range 224 pounds  Baseline proBNP     HPI: 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  He is not on ace inhibitor due to elevated creatinine.  Admitted to [facility] 12/03/2012 with anasarca and 50 pound weight gain. Placed on Milrinone and Lasix drip He was admitted with anasarca. An ECHO was repeated and revealed EF 15% with RV dysfunction. Cardiac MRI was performed with EF back up to 41% and probable old MI with other areas of patchy uptake suggesting possible myocarditis (vs infiltrative CM). Urine IFE shows (looking for amyloid) : Area of slightly restricted mobility in the IgG and kappa lanes. May need to repeat in 6-8 months, if clinically indicated. 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 Vascular Surgeon will repeat CT 01/07/13. He was not placed on ace inhibitor due to elevated creatinine but will remain on hydralazine and IMDUR for afterload reduction. Creatinine improved at the time of discharge. Overall he diuresed 73 pounds. Discharge weight 224 pounds.   02/17/13- Bone Survey - No evidence of myeloma  He returns for follow up today with him mom. Last visit hydralazine 100 mg TID.   He feels well.  Denies dyspnea, orhtopnea or PND.  No edema.  Swimming most days and walking on treadmill.  +peripheral neuropathy. Tolerating medications well but says his morning gabapentin makes him really tired.    Echo today EF ~35%   ROS: All systems negative except as listed in HPI, PMH and Problem List.  Past Medical History  Diagnosis Date  . Coronary artery disease     a. 4V CABG 2007 in [place], possible mild MI. b. Repeat cath ~2009-2010 pre-op knee surgery that was reportedly "clean" per pt.  . Hypertension   . Gout   . Diverticulitis   . Hyperlipidemia   . GI bleed  2013    Anemia/GIB with bleeding duodenal ulcer 08/2012.  Marland Kitchen Myocardial infarction 2007    "small one before OHS" (12/03/2012)  . Type II diabetes mellitus 2013  . Anxiety     "over the last couple weeks" (12/03/2012)  . Kidney stones ~ 1989; 10/2012  . Iron deficiency anemia     a. GIB 08/2012.  . Cardiomyopathy     a. EF 45-50% 08/2012.  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 atorvastatin (LIPITOR) 20 MG tablet Take 1 tablet (20 mg total) by mouth daily.  90 tablet  3  . bisacodyl (DULCOLAX) 5 MG EC tablet Take 1 tablet (5 mg total) by mouth daily as needed.  30 tablet  3  . carvedilol (COREG) 6.25 MG tablet Take 1.5 tablets (9.375 mg total) by mouth 2 (two) times daily with a meal.  90 tablet  3  . citalopram (CELEXA) 40 MG tablet Take 1 tablet (40 mg total) by mouth daily.  30 tablet  3  . docusate sodium 100 MG CAPS Take 100 mg by mouth 2 (two) times daily as needed for constipation.  30 capsule  2  . ferrous gluconate (FERGON) 216 MG tablet Take 1 tablet (216 mg total) by mouth 3 (three) times daily with meals.  90 tablet  1  . furosemide (LASIX) 40 MG  tablet Take 1 tablet (40 mg total) by mouth daily.  30 tablet  3  . gabapentin (NEURONTIN) 300 MG capsule 2 tabs TID  180 capsule  3  . glucose blood test strip Use as instructed  100 each  12  . glucose monitoring kit (FREESTYLE) monitoring kit 1 each by Does not apply route as needed for other.  1 each  0  . hydrALAZINE (APRESOLINE) 100 MG tablet Take 1 tablet (100 mg total) by mouth 3 (three) times daily.  90 tablet  3  . isosorbide mononitrate (IMDUR) 30 MG 24 hr tablet Take 1 tablet (30 mg total) by mouth daily.  30 tablet  3  . Lancets (FREESTYLE) lancets Use as instructed  100 each  12  . metFORMIN (GLUCOPHAGE) 500 MG tablet Take 1 tablet (500 mg total) by mouth 2 (two) times daily with a meal.  60 tablet  3  . potassium chloride SA (K-DUR,KLOR-CON) 20 MEQ tablet Take  1 tablet (20 mEq total) by mouth daily.  30 tablet  3  . zolpidem (AMBIEN) 5 MG tablet TAKE ONE TABLET BY MOUTH AT BEDTIME AS NEEDED FOR SLEEP  30 tablet  0   No current facility-administered medications for this encounter.     PHYSICAL EXAM: Filed Vitals:   04/05/13 0856  BP: 150/86  Pulse: 98  Weight: 242 lb 1.9 oz (109.825 kg)  SpO2: 98%  Recheck BP 140/92  General:  Well appearing. No resp difficulty Mom present HEENT: normal Neck: supple. JVP flat. Carotids 2+ bilaterally; no bruits. No lymphadenopathy or thryomegaly appreciated. Cor: PMI normal. Regular rate & rhythm. No rubs, gallops or murmurs. Lungs: clear Abdomen: soft, nontender, nondistended. No hepatosplenomegaly. No bruits or masses. Good bowel sounds. Extremities: no cyanosis, clubbing, rash, edema Neuro: alert & orientedx3, cranial nerves grossly intact. Moves all 4 extremities w/o difficulty. Affect pleasant.      ASSESSMENT & PLAN:

## 2013-04-05 NOTE — Assessment & Plan Note (Signed)
No evidence of ischemia. Continue current regimen.   

## 2013-04-05 NOTE — Progress Notes (Signed)
  Echocardiogram 2D Echocardiogram has been performed.  Osha Rane 04/05/2013, 8:13 AM

## 2013-04-05 NOTE — Patient Instructions (Addendum)
Increase carvedilol 2 tabs (12.5 mg) twice daily.  Do the following things EVERYDAY: 1) Weigh yourself in the morning before breakfast. Write it down and keep it in a log. 2) Take your medicines as prescribed 3) Eat low salt foods-Limit salt (sodium) to 2000 mg per day.  4) Stay as active as you can everyday 5) Limit all fluids for the day to less than 2 liters  Follow up 3 months  Diet class [facility]

## 2013-04-05 NOTE — Assessment & Plan Note (Addendum)
Systolic BP elevated at 150, as above will increase carveidlol 12.5 mg BID.    Attending: Agree.

## 2013-04-28 ENCOUNTER — Ambulatory Visit: Payer: Self-pay | Admitting: Neurology

## 2013-05-06 ENCOUNTER — Telehealth: Payer: Self-pay | Admitting: Family Medicine

## 2013-05-06 NOTE — Telephone Encounter (Signed)
Patient walked in to the office stating that  his cardiologist, Dr. Milas Kocher wants patient to be referred out to [facility] not [facility].  Patient also states that he has been having problems with gout and would like to know if he could do labs for this or does he need to be seen?

## 2013-05-06 NOTE — Telephone Encounter (Signed)
Discuss with patient appt scheduled for tomorrow, ok per Dr Beverely Low.

## 2013-05-06 NOTE — Telephone Encounter (Signed)
Pt will need appt for evaluation of his gout At his visit, I will discuss w/ him the reasons behind switching neuro- i have not received anything from cards that indicates a switch is desired

## 2013-05-07 ENCOUNTER — Ambulatory Visit (INDEPENDENT_AMBULATORY_CARE_PROVIDER_SITE_OTHER): Payer: Self-pay | Admitting: Family Medicine

## 2013-05-07 ENCOUNTER — Encounter: Payer: Self-pay | Admitting: Family Medicine

## 2013-05-07 ENCOUNTER — Encounter: Payer: Self-pay | Admitting: Lab

## 2013-05-07 VITALS — BP 170/102 | HR 67 | Temp 98.0°F | Ht 70.0 in | Wt 250.2 lb

## 2013-05-07 DIAGNOSIS — M109 Gout, unspecified: Secondary | ICD-10-CM

## 2013-05-07 DIAGNOSIS — E1140 Type 2 diabetes mellitus with diabetic neuropathy, unspecified: Secondary | ICD-10-CM

## 2013-05-07 DIAGNOSIS — E1142 Type 2 diabetes mellitus with diabetic polyneuropathy: Secondary | ICD-10-CM

## 2013-05-07 DIAGNOSIS — E1149 Type 2 diabetes mellitus with other diabetic neurological complication: Secondary | ICD-10-CM

## 2013-05-07 DIAGNOSIS — G8918 Other acute postprocedural pain: Secondary | ICD-10-CM

## 2013-05-07 DIAGNOSIS — I10 Essential (primary) hypertension: Secondary | ICD-10-CM

## 2013-05-07 MED ORDER — ALLOPURINOL 100 MG PO TABS: 100.0000 mg | ORAL_TABLET | Freq: Every day | ORAL | Status: DC

## 2013-05-07 MED ORDER — HYDROCODONE-ACETAMINOPHEN 5-325 MG PO TABS: 1.0000 | ORAL_TABLET | Freq: Four times a day (QID) | ORAL | Status: DC | PRN

## 2013-05-07 MED ORDER — INDOMETHACIN 25 MG PO CAPS: 25.0000 mg | ORAL_CAPSULE | Freq: Three times a day (TID) | ORAL | Status: AC | PRN

## 2013-05-07 NOTE — Progress Notes (Signed)
  Subjective:    Patient ID: Jose Petersen, male    DOB: 1966-07-08, 47 y.o.   MRN: [id number]  HPI Gout flare- R foot, sxs started 4-6 days ago.  Pain is concentrated over 1st MTP joint and 1st and 2nd toe.  Hx of gout.  Has not had flare recently.  Not currently taking meds for this.  Pt reports he used to take Indocin w/ pain meds.  Previously on Allopurinol.  + swelling.  HTN- chronic problem but very high today.  Pt reports this is 'reflective of my pain level- i'm miserable'.  Pt reports home BPs are 130s/80s.  No CP, SOB, HAs, visual changes, edema.  On Coreg, Lasix, Hydralazine.  Neuropathy- filed for disability, pt reports the Cards would prefer him to see Neuro at [facility]   Review of Systems For ROS see HPI     Objective:   Physical Exam  Vitals reviewed. Constitutional: He is oriented to person, place, and time. He appears well-developed and well-nourished. No distress.  HENT:  Head: Normocephalic and atraumatic.  Eyes: Conjunctivae and EOM are normal. Pupils are equal, round, and reactive to light.  Neck: Normal range of motion. Neck supple. No thyromegaly present.  Cardiovascular: Normal rate, regular rhythm, normal heart sounds and intact distal pulses.   No murmur heard. Pulmonary/Chest: Effort normal and breath sounds normal. No respiratory distress.  Abdominal: Soft. Bowel sounds are normal. He exhibits no distension.  Musculoskeletal: He exhibits tenderness (exquisite TTP of R 1st MTP w/ minimal edema and erythema). He exhibits no edema.  Lymphadenopathy:    He has no cervical adenopathy.  Neurological: He is alert and oriented to person, place, and time. No cranial nerve deficit.  Skin: Skin is warm and dry.  Psychiatric: He has a normal mood and affect. His behavior is normal.          Assessment & Plan:

## 2013-05-07 NOTE — Patient Instructions (Addendum)
Take the Indocin- no more than 5 days, take w/ food ICE your foot Use the Hydrocodone for severe pain Start the Allopurinol once the flare is over [facility] [employer] call you with your neuro referral Hang in there!

## 2013-05-09 NOTE — Assessment & Plan Note (Signed)
Referral to [facility] made.

## 2013-05-09 NOTE — Assessment & Plan Note (Signed)
New to provider.  Will tx current attack and then restart allopurinol as prophylactic.  Pt reports his allergies are listed incorrectly- he reports he's allergic to Tramadol and not toradol.  Pt has hx of difficulty misusing pain meds previously.  Hesitant to prescribe narcotics again but pt insists this is only thing that provides relief.  Minimal quantity given.

## 2013-05-09 NOTE — Assessment & Plan Note (Signed)
Deteriorated.  Pt's BP is very high.  Pt reports this is directly proportional to pain level.  Currently asymptomatic.  Stressed need to monitor BP at home and be in communication w/ cards regarding elevated BP.  No med changes at this time.  Pt expressed understanding and is in agreement w/ plan.

## 2013-05-10 ENCOUNTER — Ambulatory Visit: Payer: Self-pay | Admitting: Family Medicine

## 2013-05-10 ENCOUNTER — Telehealth: Payer: Self-pay | Admitting: Family Medicine

## 2013-05-10 DIAGNOSIS — M792 Neuralgia and neuritis, unspecified: Secondary | ICD-10-CM

## 2013-05-10 NOTE — Telephone Encounter (Signed)
Patient is calling and says that he is pleased about what the Hydrocodone mixed medication is doing for his foot pain. Patient says he has not not had any pain and wants to know if his neuropathy can be treated with the Hydrocodone medication?

## 2013-05-10 NOTE — Telephone Encounter (Signed)
Only if he wants to be managed by a pain clinic.  I don't tx chronic pain w/ narcotics- particularly neuropathic pain.  So either his new neurologist at [facility] will do this, or we can refer to pain management.

## 2013-05-10 NOTE — Telephone Encounter (Signed)
Please advise.//AB/CMA 

## 2013-05-11 NOTE — Telephone Encounter (Signed)
Spoke with the pt regarding his request to be tx'ed with the Hydrocodone for his neuropathy.   I informed him of Dr. Rennis Golden recommendation below.  Pt stated that he will not be able to get in with the Neuro until 08-10-13, and he did not understand why he would have to go to pain clinic for that short period of time.  I informed him that Dr. Beverely Low do not tx chronic pain.  Pt stated that he will go to pain clinic.  I informed him that we will put in the referral and he will get a call from [facility] regarding the appt.  Pt stated okay.  Referral ordered and sent.//AB/CMA

## 2013-05-14 ENCOUNTER — Telehealth: Payer: Self-pay | Admitting: Family Medicine

## 2013-05-14 DIAGNOSIS — G8918 Other acute postprocedural pain: Secondary | ICD-10-CM

## 2013-05-14 NOTE — Telephone Encounter (Signed)
PT called to see if he could get a refill on his HYDROcodone-acetaminophen (NORCO/VICODIN) 5-325 MG per tablet thanks

## 2013-05-17 MED ORDER — HYDROCODONE-ACETAMINOPHEN 5-325 MG PO TABS: 1.0000 | ORAL_TABLET | Freq: Four times a day (QID) | ORAL | Status: DC | PRN

## 2013-05-17 NOTE — Telephone Encounter (Signed)
Please ask pt when pain management appt is.  Again, should not be requiring 30 pills in 10 days.  This is not a maintenance med- this is for severe pain.  Can have #20

## 2013-05-17 NOTE — Telephone Encounter (Signed)
Spoke with the pt and asked when is his appt with pain management, and he should not be requesting 30 pills in 10 days.  Informed the pt that this is not a maintenance med-this is for severe pain, and Dr. Beverely Low is only giving him #20.  Pt stated that he has not hear about appt to pain management, and he is taking it like he was asked to take it.  Pt stated that he knows that Dr. Beverely Low does not want to prescribe the med,but it has helped with his neuropathy pain.  Pt said he is waiting appt to pain management.  Rx printed and faxed to pharmacy.//AB/CMA

## 2013-05-17 NOTE — Telephone Encounter (Signed)
Last refill:05-07-13 Last OV:05-07-13 Please advise.//AB/CMA

## 2013-05-26 ENCOUNTER — Encounter: Payer: Self-pay | Admitting: Physical Medicine & Rehabilitation

## 2013-05-31 ENCOUNTER — Other Ambulatory Visit: Payer: Self-pay | Admitting: Family Medicine

## 2013-06-01 NOTE — Telephone Encounter (Signed)
Ok to refill? Last OV 6.6.14 Last filled hydrocodone 6.16.14, ambien 4.30.14.

## 2013-06-14 ENCOUNTER — Other Ambulatory Visit: Payer: Self-pay | Admitting: Family Medicine

## 2013-06-16 NOTE — Telephone Encounter (Signed)
Patient calling to follow-up on refill. States that he is completely out today.

## 2013-06-17 NOTE — Telephone Encounter (Signed)
Ok to refill? Last OV 6.6.14 Last filled 6.30.14.

## 2013-06-17 NOTE — Telephone Encounter (Signed)
Patient is calling back about pending script. Please advise.

## 2013-06-17 NOTE — Telephone Encounter (Signed)
Rx printed and faxed to the pharmacy.//AB/CMA 

## 2013-06-28 ENCOUNTER — Encounter: Payer: Self-pay | Attending: Physical Medicine & Rehabilitation

## 2013-06-28 ENCOUNTER — Ambulatory Visit (HOSPITAL_BASED_OUTPATIENT_CLINIC_OR_DEPARTMENT_OTHER): Payer: Self-pay | Admitting: Physical Medicine & Rehabilitation

## 2013-06-28 ENCOUNTER — Encounter: Payer: Self-pay | Admitting: Physical Medicine & Rehabilitation

## 2013-06-28 VITALS — BP 139/90 | HR 71 | Resp 16 | Ht 70.0 in | Wt 259.0 lb

## 2013-06-28 DIAGNOSIS — G546 Phantom limb syndrome with pain: Secondary | ICD-10-CM

## 2013-06-28 DIAGNOSIS — E1149 Type 2 diabetes mellitus with other diabetic neurological complication: Secondary | ICD-10-CM

## 2013-06-28 DIAGNOSIS — E1140 Type 2 diabetes mellitus with diabetic neuropathy, unspecified: Secondary | ICD-10-CM

## 2013-06-28 DIAGNOSIS — E1142 Type 2 diabetes mellitus with diabetic polyneuropathy: Secondary | ICD-10-CM

## 2013-06-28 DIAGNOSIS — S98139A Complete traumatic amputation of one unspecified lesser toe, initial encounter: Secondary | ICD-10-CM | POA: Insufficient documentation

## 2013-06-28 DIAGNOSIS — I10 Essential (primary) hypertension: Secondary | ICD-10-CM | POA: Insufficient documentation

## 2013-06-28 DIAGNOSIS — G547 Phantom limb syndrome without pain: Secondary | ICD-10-CM

## 2013-06-28 DIAGNOSIS — I2510 Atherosclerotic heart disease of native coronary artery without angina pectoris: Secondary | ICD-10-CM | POA: Insufficient documentation

## 2013-06-28 DIAGNOSIS — Z5181 Encounter for therapeutic drug level monitoring: Secondary | ICD-10-CM

## 2013-06-28 MED ORDER — GABAPENTIN 400 MG PO CAPS: ORAL_CAPSULE | ORAL | Status: DC

## 2013-06-28 NOTE — Patient Instructions (Signed)
Increase gabapentin to 400mg  tablets 2 Three times a day

## 2013-06-28 NOTE — Progress Notes (Signed)
Subjective:    Patient ID: Jose Petersen, male    DOB: 02/10/1966, 47 y.o.   MRN: [id number] Complaint pain in the feet and legs. HPI Has had this problem since that February 2014. Was hospitalized in February for CHF and also had embolus to Right 3rd and 4th toes requiring amputation. Has had some phantom pain in R 3rd and 4th toes Has been on gabapentin 600 mg 3 times per day since March of 2014. No history of lumbar spine problems. Has tried opioid pain medications for gout attack which was helpful in combination with gabapentin for the burning pain in the feet. Diabetes well controlled Pain has been improved on short-term hydrocodone 5 mg twice a day Pain Inventory Average Pain 6 Pain Right Now 8 My pain is sharp, burning, stabbing and tingling  In the last 24 hours, has pain interfered with the following? General activity 7 Relation with others 7 Enjoyment of life 10 What TIME of day is your pain at its worst? evening,night Sleep (in general) Fair Since March of 2014. Pain is worse with: walking and standing Pain improves with: medication Relief from Meds: 9  Mobility walk without assistance how many minutes can you walk? 20 ability to climb steps?  yes do you drive?  yes  Function not employed: date last employed [place]. 2014 Do you have any goals in this area?  yes  Neuro/Psych No problems in this area  Prior Studies Any changes since last visit?  yes bone scan x-rays CT/MRI  Physicians involved in your care Any changes since last visit?  yes Primary care dr Beverely Low   Family History  Problem Relation Age of Onset  . COPD Father   . Heart disease Father   . Cancer Father     bladder cancer  . High blood pressure Sister   . Cancer Maternal Uncle     leukemia, unk type   History   Social History  . Marital Status: Divorced    Spouse Name: N/A    Number of Children: 0  . Years of Education: N/A   Occupational History  .      medical billing    Social History Main Topics  . Smoking status: Former Smoker -- 1.00 packs/day for 24 years    Types: Cigarettes    Quit date: 12/03/2003  . Smokeless tobacco: Never Used  . Alcohol Use: No     Comment: 1/2/2014occasional social, 1 drink per year "if that"  . Drug Use: No  . Sexually Active: Not Currently   Other Topics Concern  . None   Social History Narrative   Lives in a house without stairs.  Lives with his mom to be with her when his father got sick to help with the house.  He does not use a cane or walker and drives.  He previously worked out a [employer] about 6 days per week until a month ago.   Has insurance.  His primary care office is [facility].  Dr. Jens Som is Cardiologist.     Past Surgical History  Procedure Laterality Date  . Coronary artery bypass graft  2007    CABG X 4  . Knee cartilage surgery  2009    "left" (12/03/2012)  . Esophagogastroduodenoscopy  08/28/2012    Procedure: ESOPHAGOGASTRODUODENOSCOPY (EGD);  Surgeon: Willis Modena, MD;  Location: [facility];  Service: Endoscopy;  Laterality: N/A;  . Tonsillectomy  1960's  . Knee arthroscopy  1990's-2000's    "  3 on the left; all arthoscopies for meninscus tear" (12/03/2012)  . Cardiac catheterization  ~ 2009  . Cystoscopy/retrograde/ureteroscopy/stone extraction with basket  ?1989  . Cystoscopy with retrograde pyelogram, ureteroscopy and stent placement  10/2012  . Amputation  12/14/2012    Procedure: AMPUTATION DIGIT;  Surgeon: Sherren Kerns, MD;  Location: [facility] OR;  Service: Vascular;  Laterality: Right;  Amputation of 3rd & 4th toes of right foot    Past Medical History  Diagnosis Date  . Coronary artery disease     a. 4V CABG 2007 in [place], possible mild MI. b. Repeat cath ~2009-2010 pre-op knee surgery that was reportedly "clean" per pt.  . Hypertension   . Gout   . Diverticulitis   . Hyperlipidemia   . GI bleed 2013    Anemia/GIB with bleeding duodenal ulcer 08/2012.  Marland Kitchen Myocardial  infarction 2007    "small one before OHS" (12/03/2012)  . Type II diabetes mellitus 2013  . Anxiety     "over the last couple weeks" (12/03/2012)  . Kidney stones ~ 1989; 10/2012  . Iron deficiency anemia     a. GIB 08/2012.  . Cardiomyopathy     a. EF 45-50% 08/2012.  Marland Kitchen Neuropathy     feet due to DM    BP 139/90  Pulse 71  Resp 16  Ht 5\' 10"  (1.778 m)  Wt 259 lb (117.482 kg)  BMI 37.16 kg/m2  SpO2 100%      Review of Systems  Constitutional: Positive for unexpected weight change.  All other systems reviewed and are negative.       Objective:   Physical Exam  Nursing note and vitals reviewed. Constitutional: He is oriented to person, place, and time. He appears well-developed.  obese  HENT:  Head: Normocephalic and atraumatic.  Eyes: Conjunctivae and EOM are normal. Pupils are equal, round, and reactive to light.  Neurological: He is alert and oriented to person, place, and time. He has normal strength. A sensory deficit is present. Gait normal.  Reflex Scores:      Patellar reflexes are 2+ on the right side and 2+ on the left side.      Achilles reflexes are 0 on the right side and 0 on the left side. Stocking distribution pinprick reduction in the lower extremities. Is able to distinguish which toe was touched with the pin. Normal vibratory sense  Psychiatric: He has a normal mood and affect.          Assessment & Plan:  1.  diabetic neuropathy with pain. Pain is only partially responsive to gabapentin. Has not tried Lyrica secondary to cost issues. Has had good results with hydrocodone. Reports allergy to tramadol. Will check urine drug screen. If UDS is appropriate we can start hydrocodone 5 mg twice a day. Will schedule for EMG in about 3 weeks

## 2013-07-05 ENCOUNTER — Telehealth: Payer: Self-pay | Admitting: Physical Medicine & Rehabilitation

## 2013-07-05 NOTE — Telephone Encounter (Signed)
Message copied by Judd Gaudier on Mon Jul 05, 2013  3:35 PM ------      Message from: Su Monks      Created: Mon Jul 05, 2013  3:16 PM       This is a new patient, Dr. Doroteo Bradford put in his note, that we would prescribe Hydrocodone 5mg  bid, if his UDS is consistent. Obviously it is not consistent, therefore please send this to Dr. Doroteo Bradford, and let him decide ------

## 2013-07-05 NOTE — Telephone Encounter (Signed)
Patient had initial visit on Monday, 06/25/13.  Waiting for UDS results, needs medications.  Has been without them all weekend, in a lot of pain due to his diabetic neuropathy.  Says he has left two messages since Friday.  Results returned 07/02/13.  Please contact patient this afternoon to update.  Thanks.

## 2013-07-05 NOTE — Telephone Encounter (Signed)
Patient informed of inconsistent Urine drug screen.  He did confirm he had taken a hydrocodone the day of the test.  Advised patient we could not prescribe him anything until Dr Wynn Banker had reviewed the test and that he would not be in the office until Thursday.  Patient understands and will call back then.

## 2013-07-05 NOTE — Telephone Encounter (Signed)
Send a note to [place], saying, patient is new, Dr. Doroteo Bradford put in his last note that he will prescribe Hydrocodone 5mg  bid , if UDS is consistent, the UDS was not consistent, therefore I think it would be the best if Dr. Doroteo Bradford decides whether he still wants to prescribe with an inconsistent UDS

## 2013-07-08 ENCOUNTER — Telehealth: Payer: Self-pay

## 2013-07-08 ENCOUNTER — Telehealth: Payer: Self-pay | Admitting: *Deleted

## 2013-07-08 NOTE — Telephone Encounter (Signed)
Patient called again today to follow up on urine drug screen.  He also came to the office to see if we were going to prescribe his medication.  Please advise.

## 2013-07-08 NOTE — Telephone Encounter (Signed)
Spoke with pt after he called the office stating that the pain clinic did not give him what he needed and are refusing to treat him due to an inconsistent urine drug screen. Pt expressed frustration that it took weeks to find out the pain clinic would not treat him. Patient very unhappy with "the way the system works and not sure he will be back to see Dr. Beverely Low."

## 2013-07-08 NOTE — Telephone Encounter (Signed)
Patient refuses tramadol.  He is upset that he was not aware of the urine drug screen policy.  This policy is always advised to new patients.  Patient decided to not follow up.

## 2013-07-08 NOTE — Telephone Encounter (Signed)
UDS inconsistent, will not prescribe schedule 2 or 3 meds,can prescribe tramadol Otherwise non narcotic tx

## 2013-07-26 ENCOUNTER — Ambulatory Visit: Payer: Self-pay | Admitting: Physical Medicine & Rehabilitation

## 2013-08-13 ENCOUNTER — Encounter (HOSPITAL_COMMUNITY): Payer: Self-pay

## 2013-09-07 ENCOUNTER — Encounter (HOSPITAL_COMMUNITY): Payer: Self-pay

## 2013-09-23 ENCOUNTER — Encounter (HOSPITAL_COMMUNITY): Payer: Self-pay

## 2013-09-23 ENCOUNTER — Ambulatory Visit (HOSPITAL_COMMUNITY)
Admission: RE | Admit: 2013-09-23 | Discharge: 2013-09-23 | Disposition: A | Discharge status: Home / Self Care | Payer: Self-pay | Source: Ambulatory Visit | Attending: Internal Medicine | Admitting: Internal Medicine

## 2013-09-23 VITALS — BP 178/116 | HR 93 | Wt 251.8 lb

## 2013-09-23 DIAGNOSIS — G4733 Obstructive sleep apnea (adult) (pediatric): Secondary | ICD-10-CM

## 2013-09-23 DIAGNOSIS — E785 Hyperlipidemia, unspecified: Secondary | ICD-10-CM

## 2013-09-23 DIAGNOSIS — I10 Essential (primary) hypertension: Secondary | ICD-10-CM

## 2013-09-23 DIAGNOSIS — G9340 Encephalopathy, unspecified: Secondary | ICD-10-CM

## 2013-09-23 DIAGNOSIS — I2510 Atherosclerotic heart disease of native coronary artery without angina pectoris: Secondary | ICD-10-CM

## 2013-09-23 DIAGNOSIS — I5022 Chronic systolic (congestive) heart failure: Secondary | ICD-10-CM

## 2013-09-23 MED ORDER — LISINOPRIL 5 MG PO TABS: 5.0000 mg | ORAL_TABLET | Freq: Every day | ORAL | Status: DC

## 2013-09-23 MED ORDER — ISOSORBIDE MONONITRATE ER 60 MG PO TB24: 60.0000 mg | ORAL_TABLET | Freq: Every day | ORAL | Status: AC

## 2013-09-23 MED ORDER — CARVEDILOL 12.5 MG PO TABS: 18.7500 mg | ORAL_TABLET | Freq: Two times a day (BID) | ORAL | Status: AC

## 2013-09-23 NOTE — Patient Instructions (Signed)
Increase coreg to 18.75 mg twice a day. You can take 3 tablets two times a day until you run out and then new prescription will be 12.5 mg tablets and you will take 1 1/2 tablets two times a day.  Increase IMDUR to 60 mg daily. Take 2 tablets daily until you run out and then new prescription will be a 60 mg tablet and you will take 1 tablet daily.  Start lisinopril 5 mg daily.  Go to [facility] next Thursday on the third floor anytime and get labs.  Will refer to pulmonology for sleep study.  Follow up in 1 month  Do the following things EVERYDAY: 1) Weigh yourself in the morning before breakfast. Write it down and keep it in a log. 2) Take your medicines as prescribed 3) Eat low salt foods-Limit salt (sodium) to 2000 mg per day.  4) Stay as active as you can everyday 5) Limit all fluids for the day to less than 2 liters

## 2013-09-23 NOTE — Progress Notes (Signed)
Patient ID: Jose Petersen, male   DOB: July 30, 1966, 47 y.o.   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  He is not on ace inhibitor due to elevated creatinine.  Admitted to [facility] 12/03/2012 with anasarca and 50 pound weight gain. Placed on Milrinone and Lasix drip He was admitted with anasarca. An ECHO was repeated and revealed EF 15% with RV dysfunction. Cardiac MRI was performed with EF back up to 41% and probable old MI with other areas of patchy uptake suggesting possible myocarditis (vs infiltrative CM). Urine IFE shows (looking for amyloid) : Area of slightly restricted mobility in the IgG and kappa lanes. May need to repeat in 6-8 months, if clinically indicated. 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 Vascular Surgeon will repeat CT 01/07/13. He was not placed on ace inhibitor due to elevated creatinine but will remain on hydralazine and IMDUR for afterload reduction. Creatinine improved at the time of discharge. Overall he diuresed 73 pounds. Discharge weight 224 pounds.   02/17/13- Bone Survey - No evidence of myeloma  Follow up: Last visit increased coreg to 12.5 mg BID with no issues. Having a lot of pain in toes with nueropathy and is causing BP to go up. He is in a lot of pain today and BP is 178/116.  BP at [facility] is 120-130/70s. Overall feeling ok until the past few days started feeling nauseated and having some emesis. Denies dyspnea, orthopnea or PND.  No edema. [place]  +peripheral neuropathy, referred to [facility]. Weight is up about 10 lbs.  Patient snores loudly and does have some daytime sleepiness.   Echo 04/2013: EF ~35%  Labs (2/14): K 3.8, creatinine 1.03   ROS: All  systems negative except as listed in HPI, PMH and Problem List.  Past Medical History  Diagnosis Date  . Coronary artery disease     a. 4V CABG 2007 [place], possible mild MI. b. Repeat cath ~2009-2010 pre-op knee surgery that was reportedly "clean" per pt.  . Hypertension   . Gout   . Diverticulitis   . Hyperlipidemia   . GI bleed 2013    Anemia/GIB with bleeding duodenal ulcer 08/2012.  Marland Kitchen Myocardial infarction 2007    "small one before OHS" (12/03/2012)  . Type II diabetes mellitus 2013  . Anxiety     "over the last couple weeks" (12/03/2012)  . Kidney stones ~ 1989; 10/2012  . Iron deficiency anemia     a. GIB 08/2012.  . Cardiomyopathy     a. EF 45-50% 08/2012.  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 allopurinol (ZYLOPRIM) 100 MG tablet Take 1 tablet (100 mg total) by mouth daily.  30 tablet  6  . atorvastatin (LIPITOR) 20 MG tablet Take 1 tablet (20 mg total) by mouth daily.  90 tablet  3  . bisacodyl (DULCOLAX) 5 MG EC tablet Take 1 tablet (5 mg total) by mouth daily as needed.  30 tablet  3  . carvedilol (COREG) 12.5 MG tablet Take 1.5 tablets (18.75 mg total) by mouth 2 (  two) times daily with a meal.  90 tablet  3  . citalopram (CELEXA) 40 MG tablet Take 1 tablet (40 mg total) by mouth daily.  30 tablet  3  . docusate sodium 100 MG CAPS Take 100 mg by mouth 2 (two) times daily as needed for constipation.  30 capsule  2  . ferrous gluconate (FERGON) 216 MG tablet Take 1 tablet (216 mg total) by mouth 3 (three) times daily with meals.  90 tablet  1  . furosemide (LASIX) 40 MG tablet Take 1 tablet (40 mg total) by mouth daily.  30 tablet  3  . gabapentin (NEURONTIN) 400 MG capsule 2 tabs TID  180 capsule  3  . glucose blood test strip Use as instructed  100 each  12  . glucose monitoring kit (FREESTYLE) monitoring kit 1 each by Does not apply route as needed for other.  1 each  0  . hydrALAZINE (APRESOLINE) 100 MG  tablet Take 1 tablet (100 mg total) by mouth 3 (three) times daily.  90 tablet  3  . isosorbide mononitrate (IMDUR) 60 MG 24 hr tablet Take 1 tablet (60 mg total) by mouth daily.  30 tablet  3  . Lancets (FREESTYLE) lancets Use as instructed  100 each  12  . metFORMIN (GLUCOPHAGE) 500 MG tablet Take 1 tablet (500 mg total) by mouth 2 (two) times daily with a meal.  60 tablet  3  . Multiple Vitamins-Minerals (MULTIVITAMIN WITH MINERALS) tablet Take 1 tablet by mouth daily.      . potassium chloride SA (K-DUR,KLOR-CON) 20 MEQ tablet Take 1 tablet (20 mEq total) by mouth daily.  30 tablet  3  . lisinopril (PRINIVIL,ZESTRIL) 5 MG tablet Take 1 tablet (5 mg total) by mouth daily.  30 tablet  6   No current facility-administered medications for this encounter.    Filed Vitals:   09/23/13 0916  BP: 178/116  Pulse: 93  Weight: 251 lb 12.8 oz (114.216 kg)  SpO2: 96%    PHYSICAL EXAM: General:  Well appearing. No resp difficulty Mom present HEENT: normal Neck: Thick. JVP 8 cm. Carotids 2+ bilaterally; no bruits. No lymphadenopathy or thryomegaly appreciated. Cor: PMI normal. Regular rate & rhythm. No rubs, gallops or murmurs. Lungs: clear Abdomen: soft, nontender, nondistended. No hepatosplenomegaly. No bruits or masses. Good bowel sounds. Extremities: no cyanosis, clubbing, rash, edema Neuro: alert & orientedx3, cranial nerves grossly intact. Moves all 4 extremities w/o difficulty. Affect pleasant.   ASSESSMENT & PLAN:  1) Chronic systolic HF, ischemic CMP, EF ~16% (04/2013): NYHA II symptoms. His weight is up 10 lbs since last visit, but do not think it is fluid (JVP not significantly elevated and symptoms are minimal).  - Will continue lasix 40 mg daily. - He has not been on ACE-I in the past d/t renal insufficiency, however his Cr now is 1.03. Will start lisinopril 5 mg daily and check BMET next week.  - Will also increase coreg to 18.75 mg daily.  - Continue hydralazine 100 mg TID and  increase IMDUR to 60 mg daily. - His EF remains on the cusp of need for ICD. Will continue to titrate medications and then will repeat ECHO in 3 months to assess. If EF remains less than 35% will refer for ICD.  - Reinforced the need and importance of daily weights, a low sodium diet, and fluid restriction (less than 2 L a day). Instructed to call the HF clinic if weight increases more than 3  lbs overnight or 5 lbs in a week.  2) HTN: SBP is elevated likely d/t severe neuropathy pain. He is being referred to pain clinic. As above will start lisinopril 5 mg daily and increase coreg to 18.75 mg BID.  Will need to follow closely. 3) CAD: s/p CABG 2007. No s/s of ischemia. Will continue statin, ASA, and BB. Increase IMDUR to 60 mg daily. - Will get lipid profile next week with labs.  4) Pain: Being referred to [facility] for neuropathy pain. 5) OSA: Suspect OSA, will arrange sleep study.   Ulla Potash, NP 09/23/13  Patient seen with NP, agree with the above note.  Patient has ischemic CMP with NYHA class II symptoms.  He is quite hypertensive today but is in a lot of pain from his peripheral neuropathy.  As above he is going to a pain clinic.  Today, we will add lisinopril and uptitrate Coreg and Imdur.  Will continue current dose of Lasix.  Needs sleep study.  Echo in 3 months.   Marca Ancona 09/23/2013 12:00 PM

## 2013-10-08 ENCOUNTER — Other Ambulatory Visit (INDEPENDENT_AMBULATORY_CARE_PROVIDER_SITE_OTHER): Payer: Self-pay

## 2013-10-08 DIAGNOSIS — E785 Hyperlipidemia, unspecified: Secondary | ICD-10-CM

## 2013-10-08 DIAGNOSIS — I5022 Chronic systolic (congestive) heart failure: Secondary | ICD-10-CM

## 2013-10-08 LAB — LIPID PANEL
Total CHOL/HDL Ratio: 8
Triglycerides: 160 mg/dL — ABNORMAL HIGH (ref 0.0–149.0)

## 2013-10-08 LAB — BASIC METABOLIC PANEL
BUN: 21 mg/dL (ref 6–23)
CO2: 20 mEq/L (ref 19–32)
Calcium: 8.7 mg/dL (ref 8.4–10.5)
Creatinine, Ser: 1.5 mg/dL (ref 0.4–1.5)
GFR: 52.01 mL/min — ABNORMAL LOW (ref >60.00)
Glucose, Bld: 237 mg/dL — ABNORMAL HIGH (ref 70–99)
Sodium: 136 mEq/L (ref 135–145)

## 2013-10-14 ENCOUNTER — Ambulatory Visit
Admission: RE | Admit: 2013-10-14 | Discharge: 2013-10-14 | Disposition: A | Discharge status: Home / Self Care | Payer: Self-pay | Source: Ambulatory Visit | Attending: Gastroenterology | Admitting: Gastroenterology

## 2013-10-14 ENCOUNTER — Other Ambulatory Visit: Payer: Self-pay | Admitting: Gastroenterology

## 2013-10-14 DIAGNOSIS — R112 Nausea with vomiting, unspecified: Secondary | ICD-10-CM

## 2013-10-19 ENCOUNTER — Telehealth (HOSPITAL_COMMUNITY): Payer: Self-pay | Admitting: *Deleted

## 2013-10-19 MED ORDER — POTASSIUM CHLORIDE CRYS ER 20 MEQ PO TBCR: 40.0000 meq | EXTENDED_RELEASE_TABLET | Freq: Every day | ORAL | Status: DC

## 2013-10-19 MED ORDER — ATORVASTATIN CALCIUM 40 MG PO TABS: 40.0000 mg | ORAL_TABLET | Freq: Every day | ORAL | Status: AC

## 2013-10-19 NOTE — Telephone Encounter (Signed)
Message copied by Noralee Space on Tue Oct 19, 2013  1:22 PM ------      Message from: Jacqlyn Krauss      Created: Mon Oct 11, 2013  7:43 AM                   ----- Message -----         From: Laurey Morale, MD         Sent: 10/10/2013  11:23 PM           To: Jacqlyn Krauss, RN            Increase KCl to 40 mEq daily.  LDL too high, increase atorvastatin to 40 mg daily with lipids/LFTs. ------

## 2013-10-19 NOTE — Telephone Encounter (Signed)
Pt aware.

## 2013-10-25 ENCOUNTER — Encounter (HOSPITAL_COMMUNITY): Payer: Self-pay

## 2013-10-25 ENCOUNTER — Ambulatory Visit (HOSPITAL_COMMUNITY)
Admission: RE | Admit: 2013-10-25 | Discharge: 2013-10-25 | Disposition: A | Discharge status: Home / Self Care | Payer: Self-pay | Source: Ambulatory Visit | Attending: Internal Medicine | Admitting: Internal Medicine

## 2013-10-25 VITALS — BP 150/100 | HR 63 | Wt 278.8 lb

## 2013-10-25 DIAGNOSIS — I2589 Other forms of chronic ischemic heart disease: Secondary | ICD-10-CM | POA: Insufficient documentation

## 2013-10-25 DIAGNOSIS — G4733 Obstructive sleep apnea (adult) (pediatric): Secondary | ICD-10-CM | POA: Insufficient documentation

## 2013-10-25 DIAGNOSIS — I5022 Chronic systolic (congestive) heart failure: Secondary | ICD-10-CM | POA: Insufficient documentation

## 2013-10-25 DIAGNOSIS — I10 Essential (primary) hypertension: Secondary | ICD-10-CM | POA: Insufficient documentation

## 2013-10-25 DIAGNOSIS — Z951 Presence of aortocoronary bypass graft: Secondary | ICD-10-CM | POA: Insufficient documentation

## 2013-10-25 DIAGNOSIS — J150 Pneumonia due to Klebsiella pneumoniae: Secondary | ICD-10-CM | POA: Insufficient documentation

## 2013-10-25 DIAGNOSIS — I2510 Atherosclerotic heart disease of native coronary artery without angina pectoris: Secondary | ICD-10-CM | POA: Insufficient documentation

## 2013-10-25 DIAGNOSIS — G589 Mononeuropathy, unspecified: Secondary | ICD-10-CM | POA: Insufficient documentation

## 2013-10-25 LAB — PRO B NATRIURETIC PEPTIDE: Pro B Natriuretic peptide (BNP): 9041 pg/mL — ABNORMAL HIGH (ref 0–125)

## 2013-10-25 LAB — BASIC METABOLIC PANEL
CO2: 22 mEq/L (ref 19–32)
Chloride: 100 mEq/L (ref 96–112)
GFR calc Af Amer: 62 mL/min — ABNORMAL LOW (ref >90)
Sodium: 134 mEq/L — ABNORMAL LOW (ref 135–145)

## 2013-10-25 MED ORDER — FUROSEMIDE 40 MG PO TABS: 40.0000 mg | ORAL_TABLET | Freq: Every day | ORAL | Status: DC

## 2013-10-25 MED ORDER — POTASSIUM CHLORIDE CRYS ER 20 MEQ PO TBCR: 20.0000 meq | EXTENDED_RELEASE_TABLET | Freq: Every day | ORAL | Status: AC

## 2013-10-25 MED ORDER — METOLAZONE 2.5 MG PO TABS: 2.5000 mg | ORAL_TABLET | ORAL | Status: DC | PRN

## 2013-10-25 NOTE — Patient Instructions (Signed)
Take 40 mg of lasix twice a day for 3 days  Take 60 meq of potassium for 3 days   Take 2.5 mg Metolazone for the next 3 days  On Thursday restart lasix 40 mg daily and 20 meq of potassium daily  Please get a BMET on Friday and fax results to HF clinic  Follow up  Next week    Do the following things EVERYDAY: 1) Weigh yourself in the morning before breakfast. Write it down and keep it in a log. 2) Take your medicines as prescribed 3) Eat low salt foods-Limit salt (sodium) to 2000 mg per day.  4) Stay as active as you can everyday 5) Limit all fluids for the day to less than 2 liters

## 2013-10-25 NOTE — Progress Notes (Signed)
Patient ID: Jose Petersen, male   DOB: 1966/05/18, 47 y.o.   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  He is not on ace inhibitor due to elevated creatinine.  Admitted to [facility] 12/03/2012 with anasarca and 50 pound weight gain. Placed on Milrinone and Lasix drip He was admitted with anasarca. An ECHO was repeated and revealed EF 15% with RV dysfunction. Cardiac MRI was performed with EF back up to 41% and probable old MI with other areas of patchy uptake suggesting possible myocarditis (vs infiltrative CM). Urine IFE shows (looking for amyloid) : Area of slightly restricted mobility in the IgG and kappa lanes. May need to repeat in 6-8 months, if clinically indicated. 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 Vascular Surgeon will repeat CT 01/07/13. He was not placed on ace inhibitor due to elevated creatinine but will remain on hydralazine and IMDUR for afterload reduction. Creatinine improved at the time of discharge. Overall he diuresed 73 pounds. Discharge weight 224 pounds.   02/17/13- Bone Survey - No evidence of myeloma   He returns for follow up. Last visit atorvastatin was increased to 40 mg daily, carvedilol was increased 18.75 mg twice a day, lisinopril 5 mg daily started, and IMDUR was increased to 60 mg daily. Denis SOB/PND. Sleeps on 2 pillows. Weight at home has been trending for the last 2 weeks. Weight up to 245 pounds but here he is 278 pounds.  Denies SOB/PND/Orthopnea. Compliant with medications but left out his lasix for 1 week. Drinking > 2 liters per days. Eats soup daily.  Exercises 1time a week. Not working. Sleep study pending (11/02/13)    Echo 04/2013: EF ~35%  Labs (2/14): K 3.8,  creatinine 1.03 Labs (10/08/13) K 3.4 Creatinine 1.5 Cholesterol 197 TG 160 HDL 24 Glucose 237   ROS: All systems negative except as listed in HPI, PMH and Problem List.  Past Medical History  Diagnosis Date  . Coronary artery disease     a. 4V CABG 2007 [place], possible mild MI. b. Repeat cath ~2009-2010 pre-op knee surgery that was reportedly "clean" per pt.  . Hypertension   . Gout   . Diverticulitis   . Hyperlipidemia   . GI bleed 2013    Anemia/GIB with bleeding duodenal ulcer 08/2012.  Marland Kitchen Myocardial infarction 2007    "small one before OHS" (12/03/2012)  . Type II diabetes mellitus 2013  . Anxiety     "over the last couple weeks" (12/03/2012)  . Kidney stones ~ 1989; 10/2012  . Iron deficiency anemia     a. GIB 08/2012.  . Cardiomyopathy     a. EF 45-50% 08/2012.  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 atorvastatin (LIPITOR) 40 MG tablet Take 1 tablet (40 mg total) by mouth daily.  90 tablet  3  . bisacodyl (DULCOLAX) 5 MG EC tablet Take 1 tablet (5 mg total) by mouth daily as needed.  30 tablet  3  . carvedilol (COREG) 12.5 MG tablet Take 1.5 tablets (18.75 mg total) by mouth 2 (two) times daily with a meal.  90 tablet  3  . citalopram (CELEXA) 40 MG tablet Take 1 tablet (40 mg total) by mouth daily.  30 tablet  3  . docusate sodium 100 MG CAPS Take 100 mg by mouth 2 (two) times daily as needed for constipation.  30 capsule  2  . ferrous gluconate (FERGON) 216 MG tablet Take 1 tablet (216 mg total) by mouth 3 (three) times daily with meals.  90 tablet  1  . furosemide (LASIX) 40 MG tablet Take 1 tablet (40 mg total) by mouth daily.  30 tablet  3  . gabapentin (NEURONTIN) 400 MG capsule 2 tabs TID  180 capsule  3  . glucose blood test strip Use as instructed  100 each  12  . glucose monitoring kit (FREESTYLE) monitoring kit 1 each by Does not apply route as needed for other.  1 each  0  . hydrALAZINE (APRESOLINE)  100 MG tablet Take 1 tablet (100 mg total) by mouth 3 (three) times daily.  90 tablet  3  . isosorbide mononitrate (IMDUR) 60 MG 24 hr tablet Take 1 tablet (60 mg total) by mouth daily.  30 tablet  3  . Lancets (FREESTYLE) lancets Use as instructed  100 each  12  . lisinopril (PRINIVIL,ZESTRIL) 5 MG tablet Take 1 tablet (5 mg total) by mouth daily.  30 tablet  6  . metFORMIN (GLUCOPHAGE) 500 MG tablet Take 1 tablet (500 mg total) by mouth 2 (two) times daily with a meal.  60 tablet  3  . Multiple Vitamins-Minerals (MULTIVITAMIN WITH MINERALS) tablet Take 1 tablet by mouth daily.      . potassium chloride SA (K-DUR,KLOR-CON) 20 MEQ tablet Take 2 tablets (40 mEq total) by mouth daily.  30 tablet  3   No current facility-administered medications for this encounter.    Filed Vitals:   10/25/13 0933  BP: 150/100  Pulse: 63  Weight: 278 lb 12.8 oz (126.463 kg)  SpO2: 96%    PHYSICAL EXAM: General:  Chronically ill appearing. No resp difficulty Mom present HEENT: normal Neck: Thick. JVP to jaw Carotids 2+ bilaterally; no bruits. No lymphadenopathy or thryomegaly appreciated. Cor: PMI normal. Regular rate & rhythm. No rubs, gallops or murmurs. Lungs: clear Abdomen: soft, nontender, distended. No hepatosplenomegaly. No bruits or masses. Good bowel sounds. Extremities: no cyanosis, clubbing, rash, R and  LLE 2+ edema Neuro: alert & orientedx3, cranial nerves grossly intact. Moves all 4 extremities w/o difficulty. Affect pleasant.   ASSESSMENT & PLAN:  1) Chronic systolic HF, ischemic CMP, EF ~60% (04/2013): NYHA II symptoms. His weight is up 26 pounds form last visit.  - Volume status elevated. Increase lasix to 40 mg twice day and add Metolazone 2.5 mg for 3 days with 60 meq of potassium for 3 days then cut back to 40 mg lasix and 20 meq potassium. Check BMET today and on Friday.  - Continue  lisinopril 5 mg daily.   - Continue coreg to 18.75 mg twice a daily.  - Continue hydralazine 100 mg  TID and increase IMDUR to 60 mg daily - Repeat ECHO in January.   - Reinforced the need and importance of daily weights, a low sodium diet, and fluid restriction (less than 2 L a day). Instructed to call the HF clinic if weight increases more than 3 lbs overnight or 5 lbs in a week.  2) HTN: Continue hydralazine 100 mg tid,  3) CAD: s/p CABG 2007. No s/s of ischemia. Will continue statin, ASA,  and BB. Continue IMDUR to 60 mg daily. - Will get lipid profile next week with labs.  4) Pain: Followed at [facility] for neuropathy pain. 5) OSA: Suspect OSA, sleep study pending 11/02/13 will arrange sleep study.   Follow up next week s  Jose Petersen,Jose Petersen 10/25/2013 9:57 AM  Patient seen and examined with Tonye Becket, NP. We discussed all aspects of the encounter. I agree with the assessment and plan as stated above.   His volume status is markedly elevated in setting of missing his lasix. Agree with diuretic titration as above. He is to call us if volume status is not getting better quickly. Will need to follow labs closely. Stressed need for medication compliance. Also reinforced need for daily weights and reviewed use of sliding scale diuretics going forward. CAD stable. No evidence of ischemia. Continue current regimen.    Daniel Bensimhon,MD 11:11 AM

## 2013-11-02 ENCOUNTER — Ambulatory Visit (HOSPITAL_COMMUNITY)
Admission: RE | Admit: 2013-11-02 | Discharge: 2013-11-02 | Disposition: A | Discharge status: Home / Self Care | Payer: Self-pay | Source: Ambulatory Visit | Attending: Internal Medicine | Admitting: Internal Medicine

## 2013-11-02 ENCOUNTER — Institutional Professional Consult (permissible substitution): Payer: Self-pay | Admitting: Pulmonary Disease

## 2013-11-02 VITALS — BP 124/80 | HR 88 | Temp 98.1°F | Wt 253.2 lb

## 2013-11-02 DIAGNOSIS — I2589 Other forms of chronic ischemic heart disease: Secondary | ICD-10-CM | POA: Insufficient documentation

## 2013-11-02 DIAGNOSIS — I252 Old myocardial infarction: Secondary | ICD-10-CM | POA: Insufficient documentation

## 2013-11-02 DIAGNOSIS — Z951 Presence of aortocoronary bypass graft: Secondary | ICD-10-CM | POA: Insufficient documentation

## 2013-11-02 DIAGNOSIS — E785 Hyperlipidemia, unspecified: Secondary | ICD-10-CM

## 2013-11-02 DIAGNOSIS — I5022 Chronic systolic (congestive) heart failure: Secondary | ICD-10-CM | POA: Insufficient documentation

## 2013-11-02 DIAGNOSIS — I10 Essential (primary) hypertension: Secondary | ICD-10-CM | POA: Insufficient documentation

## 2013-11-02 DIAGNOSIS — E1142 Type 2 diabetes mellitus with diabetic polyneuropathy: Secondary | ICD-10-CM

## 2013-11-02 DIAGNOSIS — E1149 Type 2 diabetes mellitus with other diabetic neurological complication: Secondary | ICD-10-CM

## 2013-11-02 DIAGNOSIS — G589 Mononeuropathy, unspecified: Secondary | ICD-10-CM | POA: Insufficient documentation

## 2013-11-02 DIAGNOSIS — E1140 Type 2 diabetes mellitus with diabetic neuropathy, unspecified: Secondary | ICD-10-CM

## 2013-11-02 DIAGNOSIS — I2510 Atherosclerotic heart disease of native coronary artery without angina pectoris: Secondary | ICD-10-CM | POA: Insufficient documentation

## 2013-11-02 MED ORDER — LISINOPRIL 5 MG PO TABS: 5.0000 mg | ORAL_TABLET | Freq: Two times a day (BID) | ORAL | Status: AC

## 2013-11-02 NOTE — Patient Instructions (Signed)
Increase lisinopril to 5 mg twice a day.  Go get labs checked in 7-10 days.  Call if weight starts trending up. You can take an extra 40 mg lasix if weight is up more than 3 lbs in a day. If you take an extra lasix take an extra potassium.  Follow up 4-6 weeks with ECHO.  Have a wonderful holiday season.  Do the following things EVERYDAY: 1) Weigh yourself in the morning before breakfast. Write it down and keep it in a log. 2) Take your medicines as prescribed 3) Eat low salt foods-Limit salt (sodium) to 2000 mg per day.  4) Stay as active as you can everyday 5) Limit all fluids for the day to less than 2 liters

## 2013-11-02 NOTE — Progress Notes (Addendum)
Patient ID: Jose Petersen, male   DOB: Oct 09, 1966, 47 y.o.   MRN: [id number]  Vascular Surgeon: Dr Darrick Penna PCP: Dr Beverely Low Hematology/[place]: Dr Gaylyn Rong   Weight Range 224 pounds  Baseline proBNP     HPI: Mr. 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    Admitted to [facility] 12/03/2012 with anasarca and 50 pound weight gain. Placed on Milrinone and Lasix drip He was admitted with anasarca. An ECHO was repeated and revealed EF 15% with RV dysfunction. Cardiac MRI was performed with EF back up to 41% and probable old MI with other areas of patchy uptake suggesting possible myocarditis (vs infiltrative CM). Urine IFE shows (looking for amyloid) : Area of slightly restricted mobility in the IgG and kappa lanes. May need to repeat in 6-8 months, if clinically indicated. 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 Vascular Surgeon will repeat CT 01/07/13. He was not placed on ace inhibitor due to elevated creatinine but will remain on hydralazine and IMDUR for afterload reduction. Creatinine improved at the time of discharge. Overall he diuresed 73 pounds. Discharge weight 224 pounds.   02/17/13- Bone Survey - No evidence of myeloma  Follow up: Last visit volume status was elevated and his lasix was increased to 40 mg BID with metolazone 2.5 mg x 3 days. Weight is down 25 lbs. Overall feeling pretty good. Denies SOB, orthopnea, CP, or edema. Can walk around the whole grocery store without stopping d/t DOE. Compliant with medications. Following a low salt diet and drinking less than 2L a day. Back exercising at the [facility] on treadmill for 20 min, no issues. Rescheduling sleep study d/t appointment today. + neuropathy pain, has not gone to pain center yet.   Echo 04/2013: EF ~35%  Labs (2/14): K 3.8, creatinine 1.03 Labs (10/08/13) K 3.4 Creatinine 1.5  Cholesterol 197 TG 160 HDL 24 Glucose 237          (10/30/13) K 3.7, creatinine 1.53   ROS: All systems negative except as listed in HPI, PMH and Problem List.  Past Medical History  Diagnosis Date  . Coronary artery disease     a. 4V CABG 2007 in [place], possible mild MI. b. Repeat cath ~2009-2010 pre-op knee surgery that was reportedly "clean" per pt.  . Hypertension   . Gout   . Diverticulitis   . Hyperlipidemia   . GI bleed 2013    Anemia/GIB with bleeding duodenal ulcer 08/2012.  Marland Kitchen Myocardial infarction 2007    "small one before OHS" (12/03/2012)  . Type II diabetes mellitus 2013  . Anxiety     "over the last couple weeks" (12/03/2012)  . Kidney stones ~ 1989; 10/2012  . Iron deficiency anemia     a. GIB 08/2012.  . Cardiomyopathy     a. EF 45-50% 08/2012.  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 atorvastatin (LIPITOR) 40 MG tablet Take 1 tablet (40 mg total) by mouth daily.  90 tablet  3  . bisacodyl (DULCOLAX) 5 MG EC tablet Take 1 tablet (5 mg total) by mouth daily as needed.  30 tablet  3  . carvedilol (COREG) 12.5 MG tablet Take 1.5 tablets (18.75 mg total) by mouth 2 (two) times daily with  a meal.  90 tablet  3  . citalopram (CELEXA) 40 MG tablet Take 1 tablet (40 mg total) by mouth daily.  30 tablet  3  . docusate sodium 100 MG CAPS Take 100 mg by mouth 2 (two) times daily as needed for constipation.  30 capsule  2  . ferrous gluconate (FERGON) 216 MG tablet Take 1 tablet (216 mg total) by mouth 3 (three) times daily with meals.  90 tablet  1  . furosemide (LASIX) 40 MG tablet Take 1 tablet (40 mg total) by mouth daily. Take 40 mg daily and extra as directed by HF team  60 tablet  6  . gabapentin (NEURONTIN) 400 MG capsule 2 tabs TID  180 capsule  3  . glucose blood test strip Use as instructed  100 each  12  . glucose monitoring kit (FREESTYLE) monitoring kit 1 each by Does not apply route as needed for other.   1 each  0  . hydrALAZINE (APRESOLINE) 100 MG tablet Take 1 tablet (100 mg total) by mouth 3 (three) times daily.  90 tablet  3  . isosorbide mononitrate (IMDUR) 60 MG 24 hr tablet Take 1 tablet (60 mg total) by mouth daily.  30 tablet  3  . Lancets (FREESTYLE) lancets Use as instructed  100 each  12  . lisinopril (PRINIVIL,ZESTRIL) 5 MG tablet Take 1 tablet (5 mg total) by mouth 2 (two) times daily.  60 tablet  6  . metFORMIN (GLUCOPHAGE) 500 MG tablet Take 1 tablet (500 mg total) by mouth 2 (two) times daily with a meal.  60 tablet  3  . metolazone (ZAROXOLYN) 2.5 MG tablet Take 1 tablet (2.5 mg total) by mouth as needed.  8 tablet  3  . Multiple Vitamins-Minerals (MULTIVITAMIN WITH MINERALS) tablet Take 1 tablet by mouth daily.      . potassium chloride SA (K-DUR,KLOR-CON) 20 MEQ tablet Take 1 tablet (20 mEq total) by mouth daily. Take 20 meq daily and as directed by HF team  60 tablet  6   No current facility-administered medications for this encounter.    Filed Vitals:   11/02/13 0930  BP: 124/80  Pulse: 88  Temp: 98.1 F (36.7 C)  TempSrc: Oral  Weight: 253 lb 4 oz (114.873 kg)  SpO2: 98%    PHYSICAL EXAM: General:  Chronically ill appearing. No resp difficulty Mom present HEENT: normal Neck: Thick. JVP 8; Carotids 2+ bilaterally; no bruits. No lymphadenopathy or thryomegaly appreciated. Cor: PMI normal. Regular rate & rhythm. No rubs, gallops or murmurs. Lungs: clear Abdomen: soft, nontender, not distended. No hepatosplenomegaly. No bruits or masses. Good bowel sounds. Extremities: no cyanosis, clubbing, rash, no edema Neuro: alert & orientedx3, cranial nerves grossly intact. Moves all 4 extremities w/o difficulty. Affect pleasant.   ASSESSMENT & PLAN:  1) Chronic systolic HF, ischemic CMP, EF ~16% (04/2013):  - Much improved with increase in diuretics and metolazone for 3 days. Weight is down 25 lbs. Will continue lasix 40 mg daily and instructed if weight goes up more  than 3 lbs in a day to take an extra lasix with an extra potasium. - NYHA II symptoms. - Continue coreg 18.75 mg twice a daily, hydralazine 100 mg TID and IMDUR to 60 mg daily - Will increase lisinopril to 5 mg BID and check BMET in 7-10 days. - Reinforced the need and importance of daily weights, a low sodium diet, and fluid restriction (less than 2 L a day). Instructed to call  the HF clinic if weight increases more than 3 lbs overnight or 5 lbs in a week.  - ECHO in January 2015 2) HTN: - As above increase lisinopril to 5 mg BID. Continue current BB, INDUR, and hydralazine. 3) CAD: s/p CABG 2007. No s/s of ischemia. Will continue statin, ASA, BB, and ACE-I I - Recheck lipids in 2 months 4) Pain: Followed at [facility] for neuropathy pain. Patient continues to have pain but does not want to go to pain clinic at this time and that is who he was referred to by [facility]. 5) OSA: Suspect OSA, had to reschedule sleep study appt d/t conflict in MD appts.   F/U 4-6 weeks with ECHO. Aundria Rud 11/02/2013 12:58 PM

## 2013-11-09 ENCOUNTER — Other Ambulatory Visit: Payer: Self-pay | Admitting: Family Medicine

## 2013-11-09 ENCOUNTER — Telehealth: Payer: Self-pay | Admitting: Family Medicine

## 2013-11-09 NOTE — Telephone Encounter (Signed)
Patient called and stated that he called his pharmacy twice and they sent a refill over for lopram (CELEXA) 40 MG tablet. Patient has not heard anything back form his pharmacy.   Pharmacy [id number] - [place],  - [address]

## 2013-11-09 NOTE — Telephone Encounter (Signed)
Ok for #30, no refills until pt follows up for diabetes

## 2013-11-09 NOTE — Telephone Encounter (Signed)
Requesting Celexa 40mg  Last refill:06-01-13 Last OV:03-11-13(DM visit) Please advise.//AB/CMA

## 2013-11-10 NOTE — Telephone Encounter (Signed)
See encounter for (11-09-13).//AB/CMA

## 2013-11-10 NOTE — Telephone Encounter (Signed)
Rx sent to the pharmacy by e-script.  Pt needs office visit for f/u on Diabetes.//AB/CMA

## 2013-11-15 NOTE — Addendum Note (Signed)
Encounter addended by: Aundria Rud, NP on: 11/15/2013  8:31 PM<BR>     Documentation filed: Charges VN

## 2013-11-16 NOTE — Addendum Note (Signed)
Encounter addended by: Aundria Rud, NP on: 11/16/2013  8:10 AM<BR>     Documentation filed: Notes Section

## 2013-12-06 ENCOUNTER — Emergency Department (HOSPITAL_COMMUNITY)
Admission: EM | Admit: 2013-12-06 | Discharge: 2013-12-06 | Disposition: A | Discharge status: Home / Self Care | Payer: Self-pay | Attending: Emergency Medicine | Admitting: Emergency Medicine

## 2013-12-06 ENCOUNTER — Encounter (HOSPITAL_COMMUNITY): Payer: Self-pay | Admitting: Emergency Medicine

## 2013-12-06 DIAGNOSIS — Z95818 Presence of other cardiac implants and grafts: Secondary | ICD-10-CM | POA: Insufficient documentation

## 2013-12-06 DIAGNOSIS — M79609 Pain in unspecified limb: Secondary | ICD-10-CM

## 2013-12-06 DIAGNOSIS — F17200 Nicotine dependence, unspecified, uncomplicated: Secondary | ICD-10-CM | POA: Insufficient documentation

## 2013-12-06 DIAGNOSIS — R609 Edema, unspecified: Secondary | ICD-10-CM | POA: Insufficient documentation

## 2013-12-06 DIAGNOSIS — I2510 Atherosclerotic heart disease of native coronary artery without angina pectoris: Secondary | ICD-10-CM | POA: Insufficient documentation

## 2013-12-06 DIAGNOSIS — D509 Iron deficiency anemia, unspecified: Secondary | ICD-10-CM | POA: Insufficient documentation

## 2013-12-06 DIAGNOSIS — I252 Old myocardial infarction: Secondary | ICD-10-CM | POA: Insufficient documentation

## 2013-12-06 DIAGNOSIS — Z951 Presence of aortocoronary bypass graft: Secondary | ICD-10-CM | POA: Insufficient documentation

## 2013-12-06 DIAGNOSIS — E119 Type 2 diabetes mellitus without complications: Secondary | ICD-10-CM | POA: Insufficient documentation

## 2013-12-06 DIAGNOSIS — Z79899 Other long term (current) drug therapy: Secondary | ICD-10-CM | POA: Insufficient documentation

## 2013-12-06 DIAGNOSIS — Z8719 Personal history of other diseases of the digestive system: Secondary | ICD-10-CM | POA: Insufficient documentation

## 2013-12-06 DIAGNOSIS — F411 Generalized anxiety disorder: Secondary | ICD-10-CM | POA: Insufficient documentation

## 2013-12-06 DIAGNOSIS — I10 Essential (primary) hypertension: Secondary | ICD-10-CM | POA: Insufficient documentation

## 2013-12-06 DIAGNOSIS — G589 Mononeuropathy, unspecified: Secondary | ICD-10-CM | POA: Insufficient documentation

## 2013-12-06 DIAGNOSIS — M79672 Pain in left foot: Secondary | ICD-10-CM

## 2013-12-06 DIAGNOSIS — M79671 Pain in right foot: Secondary | ICD-10-CM

## 2013-12-06 DIAGNOSIS — M109 Gout, unspecified: Secondary | ICD-10-CM | POA: Insufficient documentation

## 2013-12-06 DIAGNOSIS — Z87442 Personal history of urinary calculi: Secondary | ICD-10-CM | POA: Insufficient documentation

## 2013-12-06 DIAGNOSIS — E785 Hyperlipidemia, unspecified: Secondary | ICD-10-CM | POA: Insufficient documentation

## 2013-12-06 LAB — CBC WITH DIFFERENTIAL/PLATELET
BASOS PCT: 1 % (ref 0–1)
Basophils Absolute: 0.1 10*3/uL (ref 0.0–0.1)
EOS ABS: 0.4 10*3/uL (ref 0.0–0.7)
EOS PCT: 3 % (ref 0–5)
HCT: 46.3 % (ref 39.0–52.0)
Hemoglobin: 15.9 g/dL (ref 13.0–17.0)
LYMPHS ABS: 2 10*3/uL (ref 0.7–4.0)
Lymphocytes Relative: 15 % (ref 12–46)
MCH: 31.4 pg (ref 26.0–34.0)
MCHC: 34.3 g/dL (ref 30.0–36.0)
MCV: 91.3 fL (ref 78.0–100.0)
Monocytes Absolute: 1.2 10*3/uL — ABNORMAL HIGH (ref 0.1–1.0)
Monocytes Relative: 9 % (ref 3–12)
Neutro Abs: 9.6 10*3/uL — ABNORMAL HIGH (ref 1.7–7.7)
Neutrophils Relative %: 73 % (ref 43–77)
PLATELETS: 284 10*3/uL (ref 150–400)
RBC: 5.07 MIL/uL (ref 4.22–5.81)
RDW: 15.3 % (ref 11.5–15.5)
WBC: 13.1 10*3/uL — ABNORMAL HIGH (ref 4.0–10.5)

## 2013-12-06 LAB — PROTIME-INR
INR: 0.99 (ref 0.00–1.49)
Prothrombin Time: 12.9 seconds (ref 11.6–15.2)

## 2013-12-06 LAB — BASIC METABOLIC PANEL
BUN: 15 mg/dL (ref 6–23)
CALCIUM: 9.1 mg/dL (ref 8.4–10.5)
CO2: 26 mEq/L (ref 19–32)
Chloride: 101 mEq/L (ref 96–112)
Creatinine, Ser: 1.11 mg/dL (ref 0.50–1.35)
GFR calc Af Amer: 88 mL/min — ABNORMAL LOW (ref >90)
GFR calc non Af Amer: 76 mL/min — ABNORMAL LOW (ref >90)
GLUCOSE: 164 mg/dL — AB (ref 70–99)
Potassium: 4.2 mEq/L (ref 3.7–5.3)
SODIUM: 139 meq/L (ref 137–147)

## 2013-12-06 MED ORDER — ONDANSETRON HCL 4 MG/2ML IJ SOLN
4.0000 mg | Freq: Once | INTRAMUSCULAR | Status: AC
  Administered 2013-12-06: 4 mg via INTRAVENOUS
  Filled 2013-12-06: qty 2

## 2013-12-06 MED ORDER — HYDROMORPHONE HCL PF 1 MG/ML IJ SOLN
1.0000 mg | Freq: Once | INTRAMUSCULAR | Status: AC
  Administered 2013-12-06: 1 mg via INTRAVENOUS
  Filled 2013-12-06: qty 1

## 2013-12-06 MED ORDER — OXYCODONE-ACETAMINOPHEN 5-325 MG PO TABS: 1.0000 | ORAL_TABLET | ORAL | Status: DC | PRN

## 2013-12-06 MED ORDER — SODIUM CHLORIDE 0.9 % IV SOLN
INTRAVENOUS | Status: DC
Start: 2013-12-06 — End: 2013-12-06
  Administered 2013-12-06: 09:00:00 via INTRAVENOUS

## 2013-12-06 NOTE — ED Provider Notes (Signed)
  Face-to-face evaluation   History: He has bilateral foot pain, not responsive to treatment for gout, and similar to pain when he had athero-embolic event, that required two right foot, toe amputations. He has pain to both feet, right greater than left with attempts at ambulation. He continues to have sensation in both feet, and pain with movement of toes. He, states that his CHF is under control with his weight down from his baseline,   Physical exam: Obese, alert, calm, cooperative. Toes of both feet are perfused, but appear to have a pale color. There is no overt evidence for ischemia. I am unable to palpate dorsalis pedis pulses.  11:47- case discussed with Dr. Oneida Alar, vascular surgeon; he will see the patient in the office for further evaluation, and treatment.  Medical screening examination/treatment/procedure(s) were conducted as a shared visit with non-physician practitioner(s) and myself.  I personally evaluated the patient during the encounter   EKG Interpretation    Date/Time:  Monday December 06 2013 11:08:02 EST Ventricular Rate:  81 PR Interval:  149 QRS Duration: 104 QT Interval:  426 QTC Calculation: 494 R Axis:   45 Text Interpretation:  Sinus rhythm Probable left atrial enlargement Left ventricular hypertrophy Nonspecific T abnormalities, lateral leads Borderline prolonged QT interval since last tracing no significant change Confirmed by Eulis Foster  MD, Remee Charley ([id number]) on 12/06/2013 5:15:07 PM            Richarda Blade, MD 12/08/13 1624

## 2013-12-06 NOTE — ED Notes (Signed)
C/o bilateral foot pain x 2 weeks (R>L).  Seen by PCP for same and took gout medication without relief.  Pt currently on Day 7 of Prednisone.  States pain is worse today.

## 2013-12-06 NOTE — ED Provider Notes (Signed)
CSN: [id number]     Arrival date & time 12/06/13  5597 History   First MD Initiated Contact with Patient 12/06/13 [phone]     Chief Complaint  Patient presents with  . Foot Pain   (Consider location/radiation/quality/duration/timing/severity/associated sxs/prior Treatment) HPI 48 yo male presents with bilateral foot pain x 2 weeks. PMH significant for gout, T2DM, CHF, CAD, HLD, HTN, MI. Patient describes constant sharp 6/10 pain in bilateral feet worse at the toes with weight bearing. Pain improved and described as achy when at rest.  Patient seen by PCP and Pain treated as gout 2 wks ago with indomethacin, allopurinol, and pain medication, without alleviation of pain. Patient denies fever/chills. Chest pain/SOB. Denies color change of skin.  Past Medical History  Diagnosis Date  . Coronary artery disease     a. 4V CABG 2007 in [place], possible mild MI. b. Repeat cath ~2009-2010 pre-op knee surgery that was reportedly "clean" per pt.  . Hypertension   . Gout   . Diverticulitis   . Hyperlipidemia   . GI bleed 2013    Anemia/GIB with bleeding duodenal ulcer 08/2012.  Marland Kitchen Myocardial infarction 2007    "small one before OHS" (12/03/2012)  . Type II diabetes mellitus 2013  . Anxiety     "over the last couple weeks" (12/03/2012)  . Kidney stones ~ 1989; 10/2012  . Iron deficiency anemia     a. GIB 08/2012.  . Cardiomyopathy     a. EF 45-50% 08/2012.  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 Coronary artery bypass graft  2007    CABG X 4  . Knee cartilage surgery  2009    "left" (12/03/2012)  . Esophagogastroduodenoscopy  08/28/2012    Procedure: ESOPHAGOGASTRODUODENOSCOPY (EGD);  Surgeon: Arta Silence, MD;  Location: [facility];  Service: Endoscopy;  Laterality: N/A;  . Tonsillectomy  1960's  . Knee arthroscopy  1990's-2000's    "3 on the left; all arthoscopies for meninscus tear" (12/03/2012)  . Cardiac catheterization  ~ 2009  .  Cystoscopy/retrograde/ureteroscopy/stone extraction with basket  ?1989  . Cystoscopy with retrograde pyelogram, ureteroscopy and stent placement  10/2012  . 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 COPD Father   . Heart disease Father   . Cancer Father     bladder cancer  . High blood pressure Sister   . Cancer Maternal Uncle     leukemia, unk type   History  Substance Use Topics  . Smoking status: Current Some Day Smoker -- 0.25 packs/day for 24 years    Last Attempt to Quit: 12/03/2003  . Smokeless tobacco: Never Used  . Alcohol Use: No     Comment: 1/2/2014occasional social, 1 drink per year "if that"    Review of Systems  Cardiovascular: Negative for palpitations.  Gastrointestinal: Negative for nausea, vomiting and abdominal pain.  Musculoskeletal: Negative for joint swelling.  Skin: Negative for color change, pallor, rash and wound.  Neurological: Negative for numbness and headaches.    Allergies  Toradol; Motrin; and Tramadol  Home Medications   Current Outpatient Rx  Name  Route  Sig  Dispense  Refill  . allopurinol (ZYLOPRIM) 300 MG tablet  Oral   Take 300 mg by mouth daily.         Marland Kitchen atorvastatin (LIPITOR) 40 MG tablet   Oral   Take 1 tablet (40 mg total) by mouth daily.   90 tablet   3   . carvedilol (COREG) 12.5 MG tablet   Oral   Take 1.5 tablets (18.75 mg total) by mouth 2 (two) times daily with a meal.   90 tablet   3   . citalopram (CELEXA) 40 MG tablet   Oral   Take 40 mg by mouth daily.         . ferrous gluconate (FERGON) 216 MG tablet   Oral   Take 1 tablet (216 mg total) by mouth 3 (three) times daily with meals.   90 tablet   1   . furosemide (LASIX) 40 MG tablet   Oral   Take 40 mg by mouth See admin instructions. Take 70m  daily as standing order, take additional 455mas needed for swelling         . gabapentin (NEURONTIN) 600 MG tablet   Oral   Take 1,800 mg by mouth 2 (two) times daily.         . Marland Kitchenlucose blood test strip      Use as instructed   100 each   12   . glucose monitoring kit (FREESTYLE) monitoring kit   Does not apply   1 each by Does not apply route as needed for other.   1 each   0   . hydrALAZINE (APRESOLINE) 100 MG tablet   Oral   Take 1 tablet (100 mg total) by mouth 3 (three) times daily.   90 tablet   3   . isosorbide mononitrate (IMDUR) 60 MG 24 hr tablet   Oral   Take 1 tablet (60 mg total) by mouth daily.   30 tablet   3   . Lancets (FREESTYLE) lancets      Use as instructed   100 each   12   . lisinopril (PRINIVIL,ZESTRIL) 5 MG tablet   Oral   Take 1 tablet (5 mg total) by mouth 2 (two) times daily.   60 tablet   6     Please discontinue previous orders for med.   . metFORMIN (GLUCOPHAGE) 500 MG tablet   Oral   Take 1 tablet (500 mg total) by mouth 2 (two) times daily with a meal.   60 tablet   3   . Multiple Vitamins-Minerals (MULTIVITAMIN WITH MINERALS) tablet   Oral   Take 1 tablet by mouth daily.         . potassium chloride SA (K-DUR,KLOR-CON) 20 MEQ tablet   Oral   Take 1 tablet (20 mEq total) by mouth daily. Take 20 meq daily and as directed by HF team   60 tablet   6   . oxyCODONE-acetaminophen (PERCOCET) 5-325 MG per tablet   Oral   Take 1 tablet by mouth every 4 (four) hours as needed for severe pain.   30 tablet   0    BP 186/111  Pulse 85  Temp(Src) 97.8 F (36.6 C) (Oral)  Resp 19  SpO2 93% Physical Exam  Nursing note and vitals reviewed. Constitutional: He is oriented to person, place, and time. He appears well-developed and well-nourished. No distress.  HENT:  Head: Normocephalic and atraumatic.  Cardiovascular: Normal rate and regular rhythm.  Exam reveals no gallop and no friction rub.   No murmur  heard. Bilateral Pedal pulses intact and equal.   Pulmonary/Chest: Effort normal and breath sounds normal. No respiratory distress. He has no wheezes. He has no rales.  Musculoskeletal: Normal range of motion. He exhibits edema.  Neurological: He is alert and oriented to person, place, and time.  Skin: Skin is warm and dry. No rash noted. He is not diaphoretic. No erythema.  Lower Extremities: Bilateral legs with fair hair distribution. No obvious pallor or erythema noted. Nails appear healthy and3r non-hypertrophic. Good capillary refill of all digits. Right 3rd and 4th toes amputated. No apparent lesions or fissures between web spaces. No warmth or inflammation noted. Mild 1+ pitting edema bilaterally. No distended leg veins. No exquisite tenderness to palpation.  Patient has good bilateral sensation.    Psychiatric: He has a normal mood and affect. His behavior is normal.    ED Course  Procedures (including critical care time) Labs Review Labs Reviewed  CBC WITH DIFFERENTIAL - Abnormal; Notable for the following:    WBC 13.1 (*)    Neutro Abs 9.6 (*)    Monocytes Absolute 1.2 (*)    All other components within normal limits  BASIC METABOLIC PANEL - Abnormal; Notable for the following:    Glucose, Bld 164 (*)    GFR calc non Af Amer 76 (*)    GFR calc Af Amer 88 (*)    All other components within normal limits  PROTIME-INR   Imaging Review No results found.  EKG Interpretation    Date/Time:  Monday December 06 2013 11:08:02 EST Ventricular Rate:  81 PR Interval:  149 QRS Duration: 104 QT Interval:  426 QTC Calculation: 494 R Axis:   45 Text Interpretation:  Sinus rhythm Probable left atrial enlargement Left ventricular hypertrophy Nonspecific T abnormalities, lateral leads Borderline prolonged QT interval since last tracing no significant change Confirmed by WENTZ  MD, ELLIOTT ([id number]) on 12/06/2013 5:15:07 PM            MDM   1. Foot pain, bilateral    Leukocytosis  consistent with patient's recent corticosteroid use. PT-INR WNL. EKG not significantly changes from previous. ABIs and Pedal waveforms WNL.   Patient discussed with Dr. Christ Kick who spoke with vascular surgeon. Vascular surgeon agrees to see patient in follow up visit in office for evaluation/tx. Patient's pain treated in ED.  Meds given in ED:  Medications  HYDROmorphone (DILAUDID) injection 1 mg (1 mg Intravenous Given 12/06/13 0854)  ondansetron (ZOFRAN) injection 4 mg (4 mg Intravenous Given 12/06/13 0855)  HYDROmorphone (DILAUDID) injection 1 mg (1 mg Intravenous Given 12/06/13 1022)    Discharge Medication List as of 12/06/2013 11:50 AM    START taking these medications   Details  oxyCODONE-acetaminophen (PERCOCET) 5-325 MG per tablet Take 1 tablet by mouth every 4 (four) hours as needed for severe pain., Starting 12/06/2013, Until Discontinued, Print           Dossie Arbour [place], [place] 12/06/13 2052

## 2013-12-06 NOTE — Discharge Instructions (Signed)
The cause of your foot pain, is not clear. You have good blood flow to your feet, at this time. Cardiovascular surgeon, for a followup appointment. Return here if needed for problems.

## 2013-12-06 NOTE — Progress Notes (Signed)
VASCULAR LAB PRELIMINARY  ARTERIAL  ABI completed:  ABIs and pedal waveforms within normal limits.    RIGHT    LEFT    PRESSURE WAVEFORM  PRESSURE WAVEFORM  BRACHIAL 204 Triphasic  BRACHIAL 197 Triphasic   DP 223 Triphasic  DP 217 Triphasic   AT   AT    PT 243 Triphasic  PT 226 Triphasic   PER   PER    GREAT TOE  NA GREAT TOE  NA    RIGHT LEFT  ABI 1.19 1.11     Jose Petersen, RVT 12/06/2013, 10:45 AM

## 2013-12-10 ENCOUNTER — Institutional Professional Consult (permissible substitution): Payer: Self-pay | Admitting: Pulmonary Disease

## 2013-12-13 ENCOUNTER — Encounter (HOSPITAL_COMMUNITY): Payer: Self-pay | Admitting: Emergency Medicine

## 2013-12-13 ENCOUNTER — Encounter: Payer: Self-pay | Admitting: Pulmonary Disease

## 2013-12-13 ENCOUNTER — Emergency Department (HOSPITAL_COMMUNITY)
Admission: EM | Admit: 2013-12-13 | Discharge: 2013-12-13 | Disposition: A | Discharge status: Home / Self Care | Payer: Self-pay | Attending: Emergency Medicine | Admitting: Emergency Medicine

## 2013-12-13 DIAGNOSIS — R112 Nausea with vomiting, unspecified: Secondary | ICD-10-CM | POA: Insufficient documentation

## 2013-12-13 DIAGNOSIS — I252 Old myocardial infarction: Secondary | ICD-10-CM | POA: Insufficient documentation

## 2013-12-13 DIAGNOSIS — E785 Hyperlipidemia, unspecified: Secondary | ICD-10-CM | POA: Insufficient documentation

## 2013-12-13 DIAGNOSIS — E119 Type 2 diabetes mellitus without complications: Secondary | ICD-10-CM | POA: Insufficient documentation

## 2013-12-13 DIAGNOSIS — D509 Iron deficiency anemia, unspecified: Secondary | ICD-10-CM | POA: Insufficient documentation

## 2013-12-13 DIAGNOSIS — I2510 Atherosclerotic heart disease of native coronary artery without angina pectoris: Secondary | ICD-10-CM | POA: Insufficient documentation

## 2013-12-13 DIAGNOSIS — G589 Mononeuropathy, unspecified: Secondary | ICD-10-CM | POA: Insufficient documentation

## 2013-12-13 DIAGNOSIS — Z951 Presence of aortocoronary bypass graft: Secondary | ICD-10-CM | POA: Insufficient documentation

## 2013-12-13 DIAGNOSIS — F17200 Nicotine dependence, unspecified, uncomplicated: Secondary | ICD-10-CM | POA: Insufficient documentation

## 2013-12-13 DIAGNOSIS — M79609 Pain in unspecified limb: Secondary | ICD-10-CM | POA: Insufficient documentation

## 2013-12-13 DIAGNOSIS — F411 Generalized anxiety disorder: Secondary | ICD-10-CM | POA: Insufficient documentation

## 2013-12-13 DIAGNOSIS — Z87442 Personal history of urinary calculi: Secondary | ICD-10-CM | POA: Insufficient documentation

## 2013-12-13 DIAGNOSIS — Z95818 Presence of other cardiac implants and grafts: Secondary | ICD-10-CM | POA: Insufficient documentation

## 2013-12-13 DIAGNOSIS — M79672 Pain in left foot: Secondary | ICD-10-CM

## 2013-12-13 DIAGNOSIS — M79671 Pain in right foot: Secondary | ICD-10-CM

## 2013-12-13 DIAGNOSIS — M109 Gout, unspecified: Secondary | ICD-10-CM

## 2013-12-13 DIAGNOSIS — Z79899 Other long term (current) drug therapy: Secondary | ICD-10-CM | POA: Insufficient documentation

## 2013-12-13 DIAGNOSIS — Z8719 Personal history of other diseases of the digestive system: Secondary | ICD-10-CM | POA: Insufficient documentation

## 2013-12-13 MED ORDER — ONDANSETRON 4 MG PO TBDP
8.0000 mg | ORAL_TABLET | Freq: Once | ORAL | Status: AC
  Administered 2013-12-13: 8 mg via ORAL
  Filled 2013-12-13: qty 2

## 2013-12-13 MED ORDER — COLCHICINE 0.6 MG PO TABS
0.6000 mg | ORAL_TABLET | Freq: Once | ORAL | Status: AC
  Administered 2013-12-13: 0.6 mg via ORAL
  Filled 2013-12-13: qty 1

## 2013-12-13 MED ORDER — HYDROCODONE-ACETAMINOPHEN 7.5-325 MG PO TABS: 1.0000 | ORAL_TABLET | Freq: Four times a day (QID) | ORAL | Status: DC | PRN

## 2013-12-13 MED ORDER — PROMETHAZINE HCL 25 MG PO TABS: 25.0000 mg | ORAL_TABLET | Freq: Four times a day (QID) | ORAL | Status: AC | PRN

## 2013-12-13 MED ORDER — HYDROMORPHONE HCL PF 1 MG/ML IJ SOLN
1.0000 mg | Freq: Once | INTRAMUSCULAR | Status: AC
  Administered 2013-12-13: 1 mg via INTRAMUSCULAR
  Filled 2013-12-13: qty 1

## 2013-12-13 MED ORDER — ONDANSETRON 8 MG PO TBDP
ORAL_TABLET | ORAL | Status: AC
Start: 2013-12-13 — End: ?

## 2013-12-13 NOTE — ED Notes (Signed)
The pt has been c/o bi-lateral foot pain for over one week.  He has had gout in the past and he is actually on meds that are not helping.  He is not able to get in to see hids regular doctor

## 2013-12-13 NOTE — ED Provider Notes (Signed)
Medical screening examination/treatment/procedure(s) were performed by non-physician practitioner and as supervising physician I was immediately available for consultation/collaboration.   Teressa Lower, MD 12/13/13 [phone]

## 2013-12-13 NOTE — Discharge Instructions (Signed)
Use the medication prescribed to help with any nausea or vomiting. Continue your pain medications and followup with your doctors as planned.    Gout Gout is an inflammatory arthritis caused by a buildup of uric acid crystals in the joints. Uric acid is a chemical that is normally present in the blood. When the level of uric acid in the blood is too high it can form crystals that deposit in your joints and tissues. This causes joint redness, soreness, and swelling (inflammation). Repeat attacks are common. Over time, uric acid crystals can form into masses (tophi) near a joint, destroying bone and causing disfigurement. Gout is treatable and often preventable. CAUSES  The disease begins with elevated levels of uric acid in the blood. Uric acid is produced by your body when it breaks down a naturally found substance called purines. Certain foods you eat, such as meats and fish, contain high amounts of purines. Causes of an elevated uric acid level include:  Being passed down from parent to child (heredity).  Diseases that cause increased uric acid production (such as obesity, psoriasis, and certain cancers).  Excessive alcohol use.  Diet, especially diets rich in meat and seafood.  Medicines, including certain cancer-fighting medicines (chemotherapy), water pills (diuretics), and aspirin.  Chronic kidney disease. The kidneys are no longer able to remove uric acid well.  Problems with metabolism. Conditions strongly associated with gout include:  Obesity.  High blood pressure.  High cholesterol.  Diabetes. Not everyone with elevated uric acid levels gets gout. It is not understood why some people get gout and others do not. Surgery, joint injury, and eating too much of certain foods are some of the factors that can lead to gout attacks. SYMPTOMS   An attack of gout comes on quickly. It causes intense pain with redness, swelling, and warmth in a joint.  Fever can occur.  Often, only  one joint is involved. Certain joints are more commonly involved:  Base of the big toe.  Knee.  Ankle.  Wrist.  Finger. Without treatment, an attack usually goes away in a few days to weeks. Between attacks, you usually will not have symptoms, which is different from many other forms of arthritis. DIAGNOSIS  Your caregiver will suspect gout based on your symptoms and exam. In some cases, tests may be recommended. The tests may include:  Blood tests.  Urine tests.  X-rays.  Joint fluid exam. This exam requires a needle to remove fluid from the joint (arthrocentesis). Using a microscope, gout is confirmed when uric acid crystals are seen in the joint fluid. TREATMENT  There are two phases to gout treatment: treating the sudden onset (acute) attack and preventing attacks (prophylaxis).  Treatment of an Acute Attack.  Medicines are used. These include anti-inflammatory medicines or steroid medicines.  An injection of steroid medicine into the affected joint is sometimes necessary.  The painful joint is rested. Movement can worsen the arthritis.  You may use warm or cold treatments on painful joints, depending which works best for you.  Treatment to Prevent Attacks.  If you suffer from frequent gout attacks, your caregiver may advise preventive medicine. These medicines are started after the acute attack subsides. These medicines either help your kidneys eliminate uric acid from your body or decrease your uric acid production. You may need to stay on these medicines for a very long time.  The early phase of treatment with preventive medicine can be associated with an increase in acute gout attacks. For this reason,  during the first few months of treatment, your caregiver may also advise you to take medicines usually used for acute gout treatment. Be sure you understand your caregiver's directions. Your caregiver may make several adjustments to your medicine dose before these  medicines are effective.  Discuss dietary treatment with your caregiver or dietitian. Alcohol and drinks high in sugar and fructose and foods such as meat, poultry, and seafood can increase uric acid levels. Your caregiver or dietician can advise you on drinks and foods that should be limited. HOME CARE INSTRUCTIONS   Do not take aspirin to relieve pain. This raises uric acid levels.  Only take over-the-counter or prescription medicines for pain, discomfort, or fever as directed by your caregiver.  Rest the joint as much as possible. When in bed, keep sheets and blankets off painful areas.  Keep the affected joint raised (elevated).  Apply warm or cold treatments to painful joints. Use of warm or cold treatments depends on which works best for you.  Use crutches if the painful joint is in your leg.  Drink enough fluids to keep your urine clear or pale yellow. This helps your body get rid of uric acid. Limit alcohol, sugary drinks, and fructose drinks.  Follow your dietary instructions. Pay careful attention to the amount of protein you eat. Your daily diet should emphasize fruits, vegetables, whole grains, and fat-free or low-fat milk products. Discuss the use of coffee, vitamin C, and cherries with your caregiver or dietician. These may be helpful in lowering uric acid levels.  Maintain a healthy body weight. SEEK MEDICAL CARE IF:   You develop diarrhea, vomiting, or any side effects from medicines.  You do not feel better in 24 hours, or you are getting worse. SEEK IMMEDIATE MEDICAL CARE IF:   Your joint becomes suddenly more tender, and you have chills or a fever. MAKE SURE YOU:   Understand these instructions.  Will watch your condition.  Will get help right away if you are not doing well or get worse. Document Released: 11/15/2000 Document Revised: 03/15/2013 Document Reviewed: 07/01/2012 [facility] Patient Information 2014 [place].

## 2013-12-13 NOTE — ED Provider Notes (Signed)
CSN: [id number]     Arrival date & time 12/13/13  0022 History   First MD Initiated Contact with Patient 12/13/13 [phone]     Chief Complaint  Patient presents with  . feet pain    HPI  History provided by patient. Patient is a 48 year old male with history of hypertension, hyperlipidemia, diabetes, CAD I doubt presents with complaints of bilateral feet pains. Patient states he has had no severe relief of "gout" pains to bilateral feet for the past week or more. Patient has been on pain medications, indomethacin and prednisone was which have worked in the past however symptoms are not improved. Patient also states that he has been having some nausea and vomiting that he thinks maybe related to Percocet medicines. Patient also has planned followup with the vascular surgeon who performed palpitations to his right toes in the past. He denies having any similar symptoms of cyanosis to the toes her skin. Pains are worse with pressure and walking. No other aggravating or alleviating factors. No other associated symptoms.   Past Medical History  Diagnosis Date  . Coronary artery disease     a. 4V CABG 2007 in [place], possible mild MI. b. Repeat cath ~2009-2010 pre-op knee surgery that was reportedly "clean" per pt.  . Hypertension   . Gout   . Diverticulitis   . Hyperlipidemia   . GI bleed 2013    Anemia/GIB with bleeding duodenal ulcer 08/2012.  Marland Kitchen Myocardial infarction 2007    "small one before OHS" (12/03/2012)  . Type II diabetes mellitus 2013  . Anxiety     "over the last couple weeks" (12/03/2012)  . Kidney stones ~ 1989; 10/2012  . Iron deficiency anemia     a. GIB 08/2012.  . Cardiomyopathy     a. EF 45-50% 08/2012.  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 Coronary artery bypass graft  2007    CABG X 4  . Knee cartilage surgery  2009    "left" (12/03/2012)  . Esophagogastroduodenoscopy  08/28/2012    Procedure: ESOPHAGOGASTRODUODENOSCOPY (EGD);   Surgeon: Arta Silence, MD;  Location: [facility];  Service: Endoscopy;  Laterality: N/A;  . Tonsillectomy  1960's  . Knee arthroscopy  1990's-2000's    "3 on the left; all arthoscopies for meninscus tear" (12/03/2012)  . Cardiac catheterization  ~ 2009  . Cystoscopy/retrograde/ureteroscopy/stone extraction with basket  ?1989  . Cystoscopy with retrograde pyelogram, ureteroscopy and stent placement  10/2012  . 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 COPD Father   . Heart disease Father   . Cancer Father     bladder cancer  . High blood pressure Sister   . Cancer Maternal Uncle     leukemia, unk type   History  Substance Use Topics  . Smoking status: Current Some Day Smoker -- 0.25 packs/day for 24 years    Last Attempt to Quit: 12/03/2003  . Smokeless tobacco: Never Used  . Alcohol Use: No     Comment: 1/2/2014occasional social, 1 drink per year "if that"    Review of Systems  Constitutional: Negative for fever and chills.  Neurological: Negative for weakness and numbness.  All other systems  reviewed and are negative.    Allergies  Toradol; Motrin; and Tramadol  Home Medications   Current Outpatient Rx  Name  Route  Sig  Dispense  Refill  . allopurinol (ZYLOPRIM) 300 MG tablet   Oral   Take 300 mg by mouth daily.         Marland Kitchen atorvastatin (LIPITOR) 40 MG tablet   Oral   Take 1 tablet (40 mg total) by mouth daily.   90 tablet   3   . carvedilol (COREG) 12.5 MG tablet   Oral   Take 1.5 tablets (18.75 mg total) by mouth 2 (two) times daily with a meal.   90 tablet   3   . citalopram (CELEXA) 40 MG tablet   Oral   Take 40 mg by mouth daily.         . ferrous gluconate (FERGON) 216 MG tablet   Oral   Take 1 tablet (216 mg total) by mouth 3 (three) times daily  with meals.   90 tablet   1   . furosemide (LASIX) 40 MG tablet   Oral   Take 40 mg by mouth See admin instructions. Take 40mg  daily as standing order, take additional 40mg  as needed for swelling         . gabapentin (NEURONTIN) 600 MG tablet   Oral   Take 1,800 mg by mouth 2 (two) times daily.         Marland Kitchen glucose blood test strip      Use as instructed   100 each   12   . glucose monitoring kit (FREESTYLE) monitoring kit   Does not apply   1 each by Does not apply route as needed for other.   1 each   0   . hydrALAZINE (APRESOLINE) 100 MG tablet   Oral   Take 1 tablet (100 mg total) by mouth 3 (three) times daily.   90 tablet   3   . isosorbide mononitrate (IMDUR) 60 MG 24 hr tablet   Oral   Take 1 tablet (60 mg total) by mouth daily.   30 tablet   3   . Lancets (FREESTYLE) lancets      Use as instructed   100 each   12   . lisinopril (PRINIVIL,ZESTRIL) 5 MG tablet   Oral   Take 1 tablet (5 mg total) by mouth 2 (two) times daily.   60 tablet   6     Please discontinue previous orders for med.   . metFORMIN (GLUCOPHAGE) 500 MG tablet   Oral   Take 1 tablet (500 mg total) by mouth 2 (two) times daily with a meal.   60 tablet   3   . Multiple Vitamins-Minerals (MULTIVITAMIN WITH MINERALS) tablet   Oral   Take 1 tablet by mouth daily.         Marland Kitchen oxyCODONE-acetaminophen (PERCOCET) 5-325 MG per tablet   Oral   Take 1 tablet by mouth every 4 (four) hours as needed for severe pain.   30 tablet   0   . potassium chloride SA (K-DUR,KLOR-CON) 20 MEQ tablet   Oral   Take 1 tablet (20 mEq total) by mouth daily. Take 20 meq daily and as directed by HF team   60 tablet   6    BP 183/106  Pulse 73  Temp(Src) 98 F (36.7 C)  Resp 20  SpO2 97% Physical Exam  Nursing note and vitals reviewed. Constitutional: He is oriented  to person, place, and time. He appears well-developed and well-nourished. No distress.  HENT:  Head: Normocephalic and  atraumatic.  Cardiovascular: Normal rate and regular rhythm.  Exam reveals no gallop and no friction rub.   No murmur heard. Pulmonary/Chest: Effort normal and breath sounds normal. No respiratory distress. He has no wheezes. He has no rales.  Musculoskeletal: Normal range of motion. He exhibits edema.  Dorsal pedal pulses are faint but intact bilaterally. There is good coloration of the skin and toes. Normal capillary refill. Implications to the right third and fourth toes.  Neurological: He is alert and oriented to person, place, and time.  Skin: Skin is warm and dry. No rash noted. He is not diaphoretic. No erythema.     Psychiatric: He has a normal mood and affect. His behavior is normal.    ED Course  Procedures   Patient seen and evaluated. He is well-appearing no acute distress. Does not appear in significant pain or discomfort. He returns with request to help with control of his pain. Also having some nausea and vomiting he feels is related to Percocet.  Patient given IM doses of Dilantin to help with pain. He has good coloration and pulses and feet. He does have planned followup with his vascular specialist as well as his doctors. At this time no concerning findings on exam. He new discharged to continue medications at home and followup with his doctors.   MDM   1. Gout   2. Foot pain, bilateral        Martie Lee, PA-C 12/13/13 [phone]

## 2013-12-18 ENCOUNTER — Encounter (HOSPITAL_COMMUNITY): Payer: Self-pay | Admitting: Emergency Medicine

## 2013-12-18 ENCOUNTER — Emergency Department (HOSPITAL_COMMUNITY)
Admission: EM | Admit: 2013-12-18 | Discharge: 2013-12-18 | Disposition: A | Discharge status: Home / Self Care | Payer: Self-pay | Attending: Emergency Medicine | Admitting: Emergency Medicine

## 2013-12-18 ENCOUNTER — Other Ambulatory Visit: Payer: Self-pay

## 2013-12-18 ENCOUNTER — Emergency Department (HOSPITAL_COMMUNITY): Payer: Self-pay

## 2013-12-18 DIAGNOSIS — Z8719 Personal history of other diseases of the digestive system: Secondary | ICD-10-CM | POA: Insufficient documentation

## 2013-12-18 DIAGNOSIS — J159 Unspecified bacterial pneumonia: Secondary | ICD-10-CM | POA: Insufficient documentation

## 2013-12-18 DIAGNOSIS — M109 Gout, unspecified: Secondary | ICD-10-CM | POA: Insufficient documentation

## 2013-12-18 DIAGNOSIS — D509 Iron deficiency anemia, unspecified: Secondary | ICD-10-CM | POA: Insufficient documentation

## 2013-12-18 DIAGNOSIS — F17200 Nicotine dependence, unspecified, uncomplicated: Secondary | ICD-10-CM | POA: Insufficient documentation

## 2013-12-18 DIAGNOSIS — I252 Old myocardial infarction: Secondary | ICD-10-CM | POA: Insufficient documentation

## 2013-12-18 DIAGNOSIS — Z87442 Personal history of urinary calculi: Secondary | ICD-10-CM | POA: Insufficient documentation

## 2013-12-18 DIAGNOSIS — Z79899 Other long term (current) drug therapy: Secondary | ICD-10-CM | POA: Insufficient documentation

## 2013-12-18 DIAGNOSIS — I2510 Atherosclerotic heart disease of native coronary artery without angina pectoris: Secondary | ICD-10-CM | POA: Insufficient documentation

## 2013-12-18 DIAGNOSIS — E1149 Type 2 diabetes mellitus with other diabetic neurological complication: Secondary | ICD-10-CM | POA: Insufficient documentation

## 2013-12-18 DIAGNOSIS — E785 Hyperlipidemia, unspecified: Secondary | ICD-10-CM | POA: Insufficient documentation

## 2013-12-18 DIAGNOSIS — I10 Essential (primary) hypertension: Secondary | ICD-10-CM | POA: Insufficient documentation

## 2013-12-18 DIAGNOSIS — E1142 Type 2 diabetes mellitus with diabetic polyneuropathy: Secondary | ICD-10-CM | POA: Insufficient documentation

## 2013-12-18 DIAGNOSIS — R110 Nausea: Secondary | ICD-10-CM | POA: Insufficient documentation

## 2013-12-18 DIAGNOSIS — Z951 Presence of aortocoronary bypass graft: Secondary | ICD-10-CM | POA: Insufficient documentation

## 2013-12-18 DIAGNOSIS — J189 Pneumonia, unspecified organism: Secondary | ICD-10-CM

## 2013-12-18 DIAGNOSIS — Z95818 Presence of other cardiac implants and grafts: Secondary | ICD-10-CM | POA: Insufficient documentation

## 2013-12-18 DIAGNOSIS — F411 Generalized anxiety disorder: Secondary | ICD-10-CM | POA: Insufficient documentation

## 2013-12-18 LAB — BASIC METABOLIC PANEL
BUN: 28 mg/dL — AB (ref 6–23)
CALCIUM: 9.3 mg/dL (ref 8.4–10.5)
CO2: 23 meq/L (ref 19–32)
CREATININE: 1.41 mg/dL — AB (ref 0.50–1.35)
Chloride: 102 mEq/L (ref 96–112)
GFR calc Af Amer: 66 mL/min — ABNORMAL LOW (ref >90)
GFR calc non Af Amer: 57 mL/min — ABNORMAL LOW (ref >90)
Glucose, Bld: 182 mg/dL — ABNORMAL HIGH (ref 70–99)
Potassium: 4.3 mEq/L (ref 3.7–5.3)
Sodium: 142 mEq/L (ref 137–147)

## 2013-12-18 LAB — TROPONIN I: Troponin I: <0.3 ng/mL (ref <0.30)

## 2013-12-18 LAB — POCT I-STAT TROPONIN I: Troponin i, poc: 0.04 ng/mL (ref 0.00–0.08)

## 2013-12-18 LAB — PRO B NATRIURETIC PEPTIDE: PRO B NATRI PEPTIDE: 6236 pg/mL — AB (ref 0–125)

## 2013-12-18 MED ORDER — LEVOFLOXACIN 500 MG PO TABS: 500.0000 mg | ORAL_TABLET | Freq: Every day | ORAL | Status: DC

## 2013-12-18 MED ORDER — HYDROCODONE-ACETAMINOPHEN 5-325 MG PO TABS: 1.0000 | ORAL_TABLET | Freq: Four times a day (QID) | ORAL | Status: DC | PRN

## 2013-12-18 MED ORDER — NITROGLYCERIN 0.4 MG SL SUBL
0.4000 mg | SUBLINGUAL_TABLET | SUBLINGUAL | Status: DC | PRN
  Administered 2013-12-18 (×3): 0.4 mg via SUBLINGUAL
  Filled 2013-12-18: qty 25

## 2013-12-18 MED ORDER — LEVOFLOXACIN IN D5W 500 MG/100ML IV SOLN
500.0000 mg | Freq: Once | INTRAVENOUS | Status: AC
  Administered 2013-12-18: 500 mg via INTRAVENOUS
  Filled 2013-12-18: qty 100

## 2013-12-18 MED ORDER — MORPHINE SULFATE 4 MG/ML IJ SOLN
4.0000 mg | Freq: Once | INTRAMUSCULAR | Status: AC
  Administered 2013-12-18: 4 mg via INTRAVENOUS
  Filled 2013-12-18: qty 1

## 2013-12-18 MED ORDER — ASPIRIN 81 MG PO CHEW
324.0000 mg | CHEWABLE_TABLET | Freq: Once | ORAL | Status: AC
  Administered 2013-12-18: 162 mg via ORAL
  Filled 2013-12-18: qty 4

## 2013-12-18 MED ORDER — AZITHROMYCIN 250 MG PO TABS
500.0000 mg | ORAL_TABLET | Freq: Once | ORAL | Status: DC
Start: 2013-12-18 — End: 2013-12-18

## 2013-12-18 MED ORDER — OXYCODONE-ACETAMINOPHEN 5-325 MG PO TABS
1.0000 | ORAL_TABLET | Freq: Once | ORAL | Status: AC
  Administered 2013-12-18: 1 via ORAL
  Filled 2013-12-18: qty 1

## 2013-12-18 NOTE — ED Notes (Signed)
C/o L sided chest pain that radiates to back and L arm.  Also c/o sob and nausea.

## 2013-12-18 NOTE — ED Provider Notes (Signed)
CSN: [id number]     Arrival date & time 12/18/13  0042 History   First MD Initiated Contact with Patient 12/18/13 0055     Chief Complaint  Patient presents with  . Chest Pain  . Emesis   (Consider location/radiation/quality/duration/timing/severity/associated sxs/prior Treatment) HPI Comments: [name] is a 48 y/o male with h/o CAD, ICM (EF 15%), cardiac MRI 41%MI, CABG 2007, last cath 2009 with patent grafts. Pt comes in with L sided chest pain that moves to the back and arm. The pain is pleuritic, and worse with movement of the arm. Pain started this afternoon. He went to the gym, and thought the pain was from working out - but hasnt improved. Pt has a cough and some dib. No nausea, diaphoresis. Pt denies any chestp ain while working out at [employer], and leading upto today, he had no chest pains. Cough is non productive.  Patient is a 48 y.o. male presenting with chest pain and vomiting. The history is provided by the patient.  Chest Pain Associated symptoms: back pain, cough, shortness of breath and vomiting   Associated symptoms: no abdominal pain   Emesis Associated symptoms: no abdominal pain     Past Medical History  Diagnosis Date  . Coronary artery disease     a. 4V CABG 2007 in [place], possible mild MI. b. Repeat cath ~2009-2010 pre-op knee surgery that was reportedly "clean" per pt.  . Hypertension   . Gout   . Diverticulitis   . Hyperlipidemia   . GI bleed 2013    Anemia/GIB with bleeding duodenal ulcer 08/2012.  Marland Kitchen Myocardial infarction 2007    "small one before OHS" (12/03/2012)  . Type II diabetes mellitus 2013  . Anxiety     "over the last couple weeks" (12/03/2012)  . Kidney stones ~ 1989; 10/2012  . Iron deficiency anemia     a. GIB 08/2012.  . Cardiomyopathy     a. EF 45-50% 08/2012.  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 Coronary artery bypass graft  2007    CABG X 4  . Knee cartilage surgery  2009    "left"  (12/03/2012)  . Esophagogastroduodenoscopy  08/28/2012    Procedure: ESOPHAGOGASTRODUODENOSCOPY (EGD);  Surgeon: Arta Silence, MD;  Location: [facility];  Service: Endoscopy;  Laterality: N/A;  . Tonsillectomy  1960's  . Knee arthroscopy  1990's-2000's    "3 on the left; all arthoscopies for meninscus tear" (12/03/2012)  . Cardiac catheterization  ~ 2009  . Cystoscopy/retrograde/ureteroscopy/stone extraction with basket  ?1989  . Cystoscopy with retrograde pyelogram, ureteroscopy and stent placement  10/2012  . 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 COPD Father   . Heart disease Father   . Cancer Father     bladder cancer  . High blood pressure Sister   . Cancer Maternal Uncle     leukemia, unk type   History  Substance Use Topics  . Smoking status: Current Some Day Smoker -- 0.25 packs/day for 24 years    Last Attempt to Quit: 12/03/2003  . Smokeless tobacco: Never Used  . Alcohol Use: No     Comment: 1/2/2014occasional social, 1 drink per year "  if that"    Review of Systems  Constitutional: Negative for activity change and appetite change.  Respiratory: Positive for cough and shortness of breath.   Cardiovascular: Positive for chest pain.  Gastrointestinal: Positive for vomiting. Negative for abdominal pain.  Genitourinary: Negative for dysuria.  Musculoskeletal: Positive for back pain.    Allergies  Toradol; Motrin; and Tramadol  Home Medications   Current Outpatient Rx  Name  Route  Sig  Dispense  Refill  . allopurinol (ZYLOPRIM) 300 MG tablet   Oral   Take 300 mg by mouth daily.         . Ascorbic Acid (VITAMIN C) 1000 MG tablet   Oral   Take 3,000 mg by mouth daily.         Marland Kitchen atorvastatin (LIPITOR) 40 MG tablet   Oral   Take 1 tablet (40 mg total) by  mouth daily.   90 tablet   3   . carvedilol (COREG) 12.5 MG tablet   Oral   Take 1.5 tablets (18.75 mg total) by mouth 2 (two) times daily with a meal.   90 tablet   3   . citalopram (CELEXA) 40 MG tablet   Oral   Take 40 mg by mouth daily.         . ferrous gluconate (FERGON) 216 MG tablet   Oral   Take 1 tablet (216 mg total) by mouth 3 (three) times daily with meals.   90 tablet   1   . furosemide (LASIX) 40 MG tablet   Oral   Take 40 mg by mouth See admin instructions. Take 40mg  daily as standing order, take additional 40mg  as needed for swelling         . gabapentin (NEURONTIN) 600 MG tablet   Oral   Take 1,800 mg by mouth 2 (two) times daily.         . hydrALAZINE (APRESOLINE) 100 MG tablet   Oral   Take 1 tablet (100 mg total) by mouth 3 (three) times daily.   90 tablet   3   . isosorbide mononitrate (IMDUR) 60 MG 24 hr tablet   Oral   Take 1 tablet (60 mg total) by mouth daily.   30 tablet   3   . lisinopril (PRINIVIL,ZESTRIL) 5 MG tablet   Oral   Take 1 tablet (5 mg total) by mouth 2 (two) times daily.   60 tablet   6     Please discontinue previous orders for med.   . metFORMIN (GLUCOPHAGE) 500 MG tablet   Oral   Take 1 tablet (500 mg total) by mouth 2 (two) times daily with a meal.   60 tablet   3   . Multiple Vitamins-Minerals (MULTIVITAMIN WITH MINERALS) tablet   Oral   Take 1 tablet by mouth daily.         . ondansetron (ZOFRAN ODT) 8 MG disintegrating tablet      8mg  ODT q4 hours prn nausea   20 tablet   0   . potassium chloride SA (K-DUR,KLOR-CON) 20 MEQ tablet   Oral   Take 1 tablet (20 mEq total) by mouth daily. Take 20 meq daily and as directed by HF team   60 tablet   6   . promethazine (PHENERGAN) 25 MG tablet   Oral   Take 1 tablet (25 mg total) by mouth every 6 (six) hours as needed for nausea.   20 tablet   0    BP  183/91  Pulse 75  Temp(Src) 97.8 F (36.6 C) (Oral)  Resp 24  SpO2 98% Physical Exam   Nursing note and vitals reviewed. Constitutional: He is oriented to person, place, and time. He appears well-developed.  HENT:  Head: Normocephalic and atraumatic.  Eyes: Conjunctivae and EOM are normal. Pupils are equal, round, and reactive to light.  Neck: Normal range of motion. Neck supple.  Cardiovascular: Normal rate and regular rhythm.   Pulmonary/Chest: Effort normal and breath sounds normal. He exhibits tenderness.  Left chest tenderness worse with palpation and with movement of the arms.   Abdominal: Soft. Bowel sounds are normal. He exhibits no distension. There is no tenderness. There is no rebound and no guarding.  Neurological: He is alert and oriented to person, place, and time.  Skin: Skin is warm.    ED Course  Procedures (including critical care time) Labs Review Labs Reviewed  BASIC METABOLIC PANEL - Abnormal; Notable for the following:    Glucose, Bld 182 (*)    BUN 28 (*)    Creatinine, Ser 1.41 (*)    GFR calc non Af Amer 57 (*)    GFR calc Af Amer 66 (*)    All other components within normal limits  PRO B NATRIURETIC PEPTIDE - Abnormal; Notable for the following:    Pro B Natriuretic peptide (BNP) 6236.0 (*)    All other components within normal limits  CBC   Imaging Review No results found.  EKG Interpretation   None       MDM  No diagnosis found.   Date: 12/19/2013  Rate: 83  Rhythm: normal sinus rhythm  QRS Axis: normal  Intervals: normal  ST/T Wave abnormalities: normal  Conduction Disutrbances: none  Narrative Interpretation: unremarkable   Pt with hx of CAD comes in with chest pain since this afternoon. Pain is constant, radiates to the left side - and is pleuritic and worse with palpation. Pain is not exertional, and he had no pain while at the gym earlier today. Xray shoes left sided infiltrate. Pain didn't respond to nitro. Pt observed in the ED for extended period of time, and repeat EKG and trops have been neg.  Will d.c as  CAP. Asked to see PCP next week and return to the Er if the chest pain return or his symptoms get worse.      Varney Biles, MD 12/19/13 [phone]

## 2013-12-18 NOTE — ED Notes (Signed)
Pt. C/o "uncomfortable" chest pain starting this afternoon around 1500 with SOB and nausea. Pt. Reports getting progressively worse. Radiation to left arm and back. Hx of CABG. States had a clean cath x3 years ago.

## 2013-12-18 NOTE — Discharge Instructions (Signed)
We saw you in the ER for the chest pain. All of our cardiac workup is normal, including labs, EKG. Chest Xray shows left sided pneumonia. We feel comfortable sending you home at this time. Return to the ER if the symptoms get worse. The workup in the ER is not complete, and you should follow up with your primary care doctor for further evaluation.   Pneumonia, Adult Pneumonia is an infection of the lungs. It may be caused by a germ (virus or bacteria). Some types of pneumonia can spread easily from person to person. This can happen when you cough or sneeze. HOME CARE  Only take medicine as told by your doctor.  Take your medicine (antibiotics) as told. Finish it even if you start to feel better.  Do not smoke.  You may use a vaporizer or humidifier in your room. This can help loosen thick spit (mucus).  Sleep so you are almost sitting up (semi-upright). This helps reduce coughing.  Rest. A shot (vaccine) can help prevent pneumonia. Shots are often advised for:  People over 37 years old.  Patients on chemotherapy.  People with long-term (chronic) lung problems.  People with immune system problems. GET HELP RIGHT AWAY IF:   You are getting worse.  You cannot control your cough, and you are losing sleep.  You cough up blood.  Your pain gets worse, even with medicine.  You have a fever.  Any of your problems are getting worse, not better.  You have shortness of breath or chest pain. MAKE SURE YOU:   Understand these instructions.  Will watch your condition.  Will get help right away if you are not doing well or get worse. Document Released: 05/06/2008 Document Revised: 02/10/2012 Document Reviewed: 02/08/2011 [facility] Patient Information 2014 [place].

## 2013-12-18 NOTE — ED Notes (Signed)
MD at bedside. 

## 2014-01-03 ENCOUNTER — Encounter (HOSPITAL_BASED_OUTPATIENT_CLINIC_OR_DEPARTMENT_OTHER): Payer: Self-pay | Admitting: Emergency Medicine

## 2014-01-03 ENCOUNTER — Emergency Department (HOSPITAL_BASED_OUTPATIENT_CLINIC_OR_DEPARTMENT_OTHER): Payer: Self-pay

## 2014-01-03 ENCOUNTER — Emergency Department (HOSPITAL_BASED_OUTPATIENT_CLINIC_OR_DEPARTMENT_OTHER)
Admission: EM | Admit: 2014-01-03 | Discharge: 2014-01-03 | Disposition: A | Discharge status: Home / Self Care | Payer: Self-pay | Attending: Emergency Medicine | Admitting: Emergency Medicine

## 2014-01-03 DIAGNOSIS — F411 Generalized anxiety disorder: Secondary | ICD-10-CM | POA: Insufficient documentation

## 2014-01-03 DIAGNOSIS — E785 Hyperlipidemia, unspecified: Secondary | ICD-10-CM | POA: Insufficient documentation

## 2014-01-03 DIAGNOSIS — S298XXA Other specified injuries of thorax, initial encounter: Secondary | ICD-10-CM | POA: Insufficient documentation

## 2014-01-03 DIAGNOSIS — Z792 Long term (current) use of antibiotics: Secondary | ICD-10-CM | POA: Insufficient documentation

## 2014-01-03 DIAGNOSIS — M549 Dorsalgia, unspecified: Secondary | ICD-10-CM

## 2014-01-03 DIAGNOSIS — E119 Type 2 diabetes mellitus without complications: Secondary | ICD-10-CM | POA: Insufficient documentation

## 2014-01-03 DIAGNOSIS — Y92410 Unspecified street and highway as the place of occurrence of the external cause: Secondary | ICD-10-CM | POA: Insufficient documentation

## 2014-01-03 DIAGNOSIS — S199XXA Unspecified injury of neck, initial encounter: Secondary | ICD-10-CM

## 2014-01-03 DIAGNOSIS — I10 Essential (primary) hypertension: Secondary | ICD-10-CM | POA: Insufficient documentation

## 2014-01-03 DIAGNOSIS — Z8669 Personal history of other diseases of the nervous system and sense organs: Secondary | ICD-10-CM | POA: Insufficient documentation

## 2014-01-03 DIAGNOSIS — Z87442 Personal history of urinary calculi: Secondary | ICD-10-CM | POA: Insufficient documentation

## 2014-01-03 DIAGNOSIS — S4980XA Other specified injuries of shoulder and upper arm, unspecified arm, initial encounter: Secondary | ICD-10-CM | POA: Insufficient documentation

## 2014-01-03 DIAGNOSIS — S46909A Unspecified injury of unspecified muscle, fascia and tendon at shoulder and upper arm level, unspecified arm, initial encounter: Secondary | ICD-10-CM | POA: Insufficient documentation

## 2014-01-03 DIAGNOSIS — Z8719 Personal history of other diseases of the digestive system: Secondary | ICD-10-CM | POA: Insufficient documentation

## 2014-01-03 DIAGNOSIS — S0993XA Unspecified injury of face, initial encounter: Secondary | ICD-10-CM | POA: Insufficient documentation

## 2014-01-03 DIAGNOSIS — J189 Pneumonia, unspecified organism: Secondary | ICD-10-CM

## 2014-01-03 DIAGNOSIS — J159 Unspecified bacterial pneumonia: Secondary | ICD-10-CM | POA: Insufficient documentation

## 2014-01-03 DIAGNOSIS — IMO0002 Reserved for concepts with insufficient information to code with codable children: Secondary | ICD-10-CM | POA: Insufficient documentation

## 2014-01-03 DIAGNOSIS — M542 Cervicalgia: Secondary | ICD-10-CM

## 2014-01-03 DIAGNOSIS — I2510 Atherosclerotic heart disease of native coronary artery without angina pectoris: Secondary | ICD-10-CM | POA: Insufficient documentation

## 2014-01-03 DIAGNOSIS — Z9889 Other specified postprocedural states: Secondary | ICD-10-CM | POA: Insufficient documentation

## 2014-01-03 DIAGNOSIS — Z79899 Other long term (current) drug therapy: Secondary | ICD-10-CM | POA: Insufficient documentation

## 2014-01-03 DIAGNOSIS — Z951 Presence of aortocoronary bypass graft: Secondary | ICD-10-CM | POA: Insufficient documentation

## 2014-01-03 DIAGNOSIS — Z87891 Personal history of nicotine dependence: Secondary | ICD-10-CM | POA: Insufficient documentation

## 2014-01-03 DIAGNOSIS — I252 Old myocardial infarction: Secondary | ICD-10-CM | POA: Insufficient documentation

## 2014-01-03 DIAGNOSIS — Y9389 Activity, other specified: Secondary | ICD-10-CM | POA: Insufficient documentation

## 2014-01-03 DIAGNOSIS — M109 Gout, unspecified: Secondary | ICD-10-CM | POA: Insufficient documentation

## 2014-01-03 DIAGNOSIS — D509 Iron deficiency anemia, unspecified: Secondary | ICD-10-CM | POA: Insufficient documentation

## 2014-01-03 MED ORDER — IOHEXOL 300 MG/ML  SOLN
80.0000 mL | Freq: Once | INTRAMUSCULAR | Status: AC | PRN
  Administered 2014-01-03: 80 mL via INTRAVENOUS

## 2014-01-03 MED ORDER — OXYCODONE-ACETAMINOPHEN 5-325 MG PO TABS: 1.0000 | ORAL_TABLET | ORAL | Status: DC | PRN

## 2014-01-03 MED ORDER — DOXYCYCLINE HYCLATE 100 MG PO CAPS: 100.0000 mg | ORAL_CAPSULE | Freq: Two times a day (BID) | ORAL | Status: DC

## 2014-01-03 MED ORDER — OXYCODONE-ACETAMINOPHEN 5-325 MG PO TABS
2.0000 | ORAL_TABLET | Freq: Once | ORAL | Status: AC
  Administered 2014-01-03: 2 via ORAL
  Filled 2014-01-03: qty 2
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
## 2014-01-03 NOTE — ED Provider Notes (Signed)
Medical screening examination/treatment/procedure(s) were performed by non-physician practitioner and as supervising physician I was immediately available for consultation/collaboration.   Dot Lanes, MD 01/03/14 [phone]
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
## 2014-01-05 ENCOUNTER — Emergency Department (HOSPITAL_BASED_OUTPATIENT_CLINIC_OR_DEPARTMENT_OTHER)
Admission: EM | Admit: 2014-01-05 | Discharge: 2014-01-05 | Disposition: A | Discharge status: Home / Self Care | Payer: No Typology Code available for payment source | Attending: Emergency Medicine | Admitting: Emergency Medicine

## 2014-01-05 ENCOUNTER — Telehealth (HOSPITAL_COMMUNITY): Payer: Self-pay | Admitting: Cardiology

## 2014-01-05 ENCOUNTER — Encounter (HOSPITAL_BASED_OUTPATIENT_CLINIC_OR_DEPARTMENT_OTHER): Payer: Self-pay | Admitting: Emergency Medicine

## 2014-01-05 ENCOUNTER — Encounter (HOSPITAL_COMMUNITY): Payer: Self-pay | Admitting: Cardiology

## 2014-01-05 DIAGNOSIS — M542 Cervicalgia: Secondary | ICD-10-CM | POA: Insufficient documentation

## 2014-01-05 DIAGNOSIS — Z87442 Personal history of urinary calculi: Secondary | ICD-10-CM | POA: Insufficient documentation

## 2014-01-05 DIAGNOSIS — I252 Old myocardial infarction: Secondary | ICD-10-CM | POA: Insufficient documentation

## 2014-01-05 DIAGNOSIS — M2569 Stiffness of other specified joint, not elsewhere classified: Secondary | ICD-10-CM | POA: Insufficient documentation

## 2014-01-05 DIAGNOSIS — E1149 Type 2 diabetes mellitus with other diabetic neurological complication: Secondary | ICD-10-CM | POA: Insufficient documentation

## 2014-01-05 DIAGNOSIS — D509 Iron deficiency anemia, unspecified: Secondary | ICD-10-CM | POA: Insufficient documentation

## 2014-01-05 DIAGNOSIS — R079 Chest pain, unspecified: Secondary | ICD-10-CM | POA: Insufficient documentation

## 2014-01-05 DIAGNOSIS — Z79899 Other long term (current) drug therapy: Secondary | ICD-10-CM | POA: Insufficient documentation

## 2014-01-05 DIAGNOSIS — S20219A Contusion of unspecified front wall of thorax, initial encounter: Secondary | ICD-10-CM

## 2014-01-05 DIAGNOSIS — I10 Essential (primary) hypertension: Secondary | ICD-10-CM | POA: Insufficient documentation

## 2014-01-05 DIAGNOSIS — S161XXA Strain of muscle, fascia and tendon at neck level, initial encounter: Secondary | ICD-10-CM

## 2014-01-05 DIAGNOSIS — I2510 Atherosclerotic heart disease of native coronary artery without angina pectoris: Secondary | ICD-10-CM | POA: Insufficient documentation

## 2014-01-05 DIAGNOSIS — Z87891 Personal history of nicotine dependence: Secondary | ICD-10-CM | POA: Insufficient documentation

## 2014-01-05 DIAGNOSIS — Z791 Long term (current) use of non-steroidal anti-inflammatories (NSAID): Secondary | ICD-10-CM | POA: Insufficient documentation

## 2014-01-05 DIAGNOSIS — G8911 Acute pain due to trauma: Secondary | ICD-10-CM | POA: Insufficient documentation

## 2014-01-05 DIAGNOSIS — Z951 Presence of aortocoronary bypass graft: Secondary | ICD-10-CM | POA: Insufficient documentation

## 2014-01-05 DIAGNOSIS — E1142 Type 2 diabetes mellitus with diabetic polyneuropathy: Secondary | ICD-10-CM | POA: Insufficient documentation

## 2014-01-05 DIAGNOSIS — E785 Hyperlipidemia, unspecified: Secondary | ICD-10-CM | POA: Insufficient documentation

## 2014-01-05 DIAGNOSIS — M109 Gout, unspecified: Secondary | ICD-10-CM | POA: Insufficient documentation

## 2014-01-05 DIAGNOSIS — Z760 Encounter for issue of repeat prescription: Secondary | ICD-10-CM | POA: Insufficient documentation

## 2014-01-05 DIAGNOSIS — M2560 Stiffness of unspecified joint, not elsewhere classified: Secondary | ICD-10-CM

## 2014-01-05 DIAGNOSIS — F411 Generalized anxiety disorder: Secondary | ICD-10-CM | POA: Insufficient documentation

## 2014-01-05 DIAGNOSIS — Z8719 Personal history of other diseases of the digestive system: Secondary | ICD-10-CM | POA: Insufficient documentation

## 2014-01-05 MED ORDER — OXYCODONE-ACETAMINOPHEN 5-325 MG PO TABS: 2.0000 | ORAL_TABLET | ORAL | Status: DC | PRN

## 2014-01-05 MED ORDER — METHOCARBAMOL 500 MG PO TABS: 500.0000 mg | ORAL_TABLET | Freq: Three times a day (TID) | ORAL | Status: DC | PRN

## 2014-01-05 MED ORDER — METHOCARBAMOL 500 MG PO TABS
500.0000 mg | ORAL_TABLET | Freq: Two times a day (BID) | ORAL | Status: AC
Start: 2014-01-05 — End: ?

## 2014-01-05 NOTE — Discharge Instructions (Signed)
Cervical Sprain A cervical sprain is an injury in the neck in which the strong, fibrous tissues (ligaments) that connect your neck bones stretch or tear. Cervical sprains can range from mild to severe. Severe cervical sprains can cause the neck vertebrae to be unstable. This can lead to damage of the spinal cord and can result in serious nervous system problems. The amount of time it takes for a cervical sprain to get better depends on the cause and extent of the injury. Most cervical sprains heal in 1 to 3 weeks. CAUSES  Severe cervical sprains may be caused by:   Contact sport injuries (such as from football, rugby, wrestling, hockey, auto racing, gymnastics, diving, martial arts, or boxing).   Motor vehicle collisions.   Whiplash injuries. This is an injury from a sudden forward-and backward whipping movement of the head and neck.  Falls.  Mild cervical sprains may be caused by:   Being in an awkward position, such as while cradling a telephone between your ear and shoulder.   Sitting in a chair that does not offer proper support.   Working at a poorly Landscape architect station.   Looking up or down for long periods of time.  SYMPTOMS   Pain, soreness, stiffness, or a burning sensation in the front, back, or sides of the neck. This discomfort may develop immediately after the injury or slowly, 24 hours or more after the injury.   Pain or tenderness directly in the middle of the back of the neck.   Shoulder or upper back pain.   Limited ability to move the neck.   Headache.   Dizziness.   Weakness, numbness, or tingling in the hands or arms.   Muscle spasms.   Difficulty swallowing or chewing.   Tenderness and swelling of the neck.  DIAGNOSIS  Most of the time your health care provider can diagnose a cervical sprain by taking your history and doing a physical exam. Your health care provider will ask about previous neck injuries and any known neck  problems, such as arthritis in the neck. X-rays may be taken to find out if there are any other problems, such as with the bones of the neck. Other tests, such as a CT scan or MRI, may also be needed.  TREATMENT  Treatment depends on the severity of the cervical sprain. Mild sprains can be treated with rest, keeping the neck in place (immobilization), and pain medicines. Severe cervical sprains are immediately immobilized. Further treatment is done to help with pain, muscle spasms, and other symptoms and may include:  Medicines, such as pain relievers, numbing medicines, or muscle relaxants.   Physical therapy. This may involve stretching exercises, strengthening exercises, and posture training. Exercises and improved posture can help stabilize the neck, strengthen muscles, and help stop symptoms from returning.  HOME CARE INSTRUCTIONS   Put ice on the injured area.   Put ice in a plastic bag.   Place a towel between your skin and the bag.   Leave the ice on for 15 20 minutes, 3 4 times a day.   If your injury was severe, you may have been given a cervical collar to wear. A cervical collar is a two-piece collar designed to keep your neck from moving while it heals.  Do not remove the collar unless instructed by your health care provider.  If you have long hair, keep it outside of the collar.  Ask your health care provider before making any adjustments to your collar.  Minor adjustments may be required over time to improve comfort and reduce pressure on your chin or on the back of your head.  Ifyou are allowed to remove the collar for cleaning or bathing, follow your health care provider's instructions on how to do so safely.  Keep your collar clean by wiping it with mild soap and water and drying it completely. If the collar you have been given includes removable pads, remove them every 1 2 days and hand wash them with soap and water. Allow them to air dry. They should be completely  dry before you wear them in the collar.  If you are allowed to remove the collar for cleaning and bathing, wash and dry the skin of your neck. Check your skin for irritation or sores. If you see any, tell your health care provider.  Do not drive while wearing the collar.   Only take over-the-counter or prescription medicines for pain, discomfort, or fever as directed by your health care provider.   Keep all follow-up appointments as directed by your health care provider.   Keep all physical therapy appointments as directed by your health care provider.   Make any needed adjustments to your workstation to promote good posture.   Avoid positions and activities that make your symptoms worse.   Warm up and stretch before being active to help prevent problems.  SEEK MEDICAL CARE IF:   Your pain is not controlled with medicine.   You are unable to decrease your pain medicine over time as planned.   Your activity level is not improving as expected.  SEEK IMMEDIATE MEDICAL CARE IF:   You develop any bleeding.  You develop stomach upset.  You have signs of an allergic reaction to your medicine.   Your symptoms get worse.   You develop new, unexplained symptoms.   You have numbness, tingling, weakness, or paralysis in any part of your body.  MAKE SURE YOU:   Understand these instructions.  Will watch your condition.  Will get help right away if you are not doing well or get worse. Document Released: 09/15/2007 Document Revised: 09/08/2013 Document Reviewed: 05/26/2013 [facility] Patient Information 2014 [place].  Blunt Chest Trauma Blunt chest trauma is an injury caused by a blow to the chest. These chest injuries can be very painful. Blunt chest trauma often results in bruised or broken (fractured) ribs. Most cases of bruised and fractured ribs from blunt chest traumas get better after 1 to 3 weeks of rest and pain medicine. Often, the soft tissue in the  chest wall is also injured, causing pain and bruising. Internal organs, such as the heart and lungs, may also be injured. Blunt chest trauma can lead to serious medical problems. This injury requires immediate medical care. CAUSES   Motor vehicle collisions.  Falls.  Physical violence.  Sports injuries. SYMPTOMS   Chest pain. The pain may be worse when you move or breathe deeply.  Shortness of breath.  Lightheadedness.  Bruising.  Tenderness.  Swelling. DIAGNOSIS  Your caregiver will do a physical exam. X-rays may be taken to look for fractures. However, minor rib fractures may not show up on X-rays until a few days after the injury. If a more serious injury is suspected, further imaging tests may be done. This may include ultrasounds, computed tomography (CT) scans, or magnetic resonance imaging (MRI). TREATMENT  Treatment depends on the severity of your injury. Your caregiver may prescribe pain medicines and deep breathing exercises. HOME CARE INSTRUCTIONS  Limit your activities until you can move around without much pain.  Do not do any strenuous work until your injury is healed.  Put ice on the injured area.  Put ice in a plastic bag.  Place a towel between your skin and the bag.  Leave the ice on for 15-20 minutes, 03-04 times a day.  You may wear a rib belt as directed by your caregiver to reduce pain.  Practice deep breathing as directed by your caregiver to keep your lungs clear.  Only take over-the-counter or prescription medicines for pain, fever, or discomfort as directed by your caregiver. SEEK IMMEDIATE MEDICAL CARE IF:   You have increasing pain or shortness of breath.  You cough up blood.  You have nausea, vomiting, or abdominal pain.  You have a fever.  You feel dizzy, weak, or you faint. MAKE SURE YOU:  Understand these instructions.  Will watch your condition.  Will get help right away if you are not doing well or get worse. Document  Released: 12/26/2004 Document Revised: 02/10/2012 Document Reviewed: 09/04/2011 [facility] Patient Information 2014 [place].
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
## 2014-01-05 NOTE — Telephone Encounter (Signed)
Attempting to contact pt to schedule follow up I have been unable to reach this patient by phone.  A letter is being sent to the last known home address.
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
## 2014-01-10 ENCOUNTER — Encounter (HOSPITAL_COMMUNITY): Payer: Self-pay | Admitting: Emergency Medicine

## 2014-01-10 ENCOUNTER — Emergency Department (HOSPITAL_COMMUNITY): Payer: No Typology Code available for payment source

## 2014-01-10 ENCOUNTER — Emergency Department (HOSPITAL_COMMUNITY)
Admission: EM | Admit: 2014-01-10 | Discharge: 2014-01-10 | Disposition: A | Discharge status: Home / Self Care | Payer: No Typology Code available for payment source | Attending: Emergency Medicine | Admitting: Emergency Medicine

## 2014-01-10 DIAGNOSIS — R42 Dizziness and giddiness: Secondary | ICD-10-CM | POA: Insufficient documentation

## 2014-01-10 DIAGNOSIS — D509 Iron deficiency anemia, unspecified: Secondary | ICD-10-CM | POA: Insufficient documentation

## 2014-01-10 DIAGNOSIS — M109 Gout, unspecified: Secondary | ICD-10-CM | POA: Insufficient documentation

## 2014-01-10 DIAGNOSIS — Z87442 Personal history of urinary calculi: Secondary | ICD-10-CM | POA: Insufficient documentation

## 2014-01-10 DIAGNOSIS — Z8719 Personal history of other diseases of the digestive system: Secondary | ICD-10-CM | POA: Insufficient documentation

## 2014-01-10 DIAGNOSIS — Z87891 Personal history of nicotine dependence: Secondary | ICD-10-CM | POA: Insufficient documentation

## 2014-01-10 DIAGNOSIS — I252 Old myocardial infarction: Secondary | ICD-10-CM | POA: Insufficient documentation

## 2014-01-10 DIAGNOSIS — E785 Hyperlipidemia, unspecified: Secondary | ICD-10-CM | POA: Insufficient documentation

## 2014-01-10 DIAGNOSIS — R55 Syncope and collapse: Secondary | ICD-10-CM | POA: Insufficient documentation

## 2014-01-10 DIAGNOSIS — J159 Unspecified bacterial pneumonia: Secondary | ICD-10-CM | POA: Insufficient documentation

## 2014-01-10 DIAGNOSIS — R079 Chest pain, unspecified: Secondary | ICD-10-CM | POA: Insufficient documentation

## 2014-01-10 DIAGNOSIS — E119 Type 2 diabetes mellitus without complications: Secondary | ICD-10-CM | POA: Insufficient documentation

## 2014-01-10 DIAGNOSIS — I2510 Atherosclerotic heart disease of native coronary artery without angina pectoris: Secondary | ICD-10-CM | POA: Insufficient documentation

## 2014-01-10 DIAGNOSIS — J189 Pneumonia, unspecified organism: Secondary | ICD-10-CM

## 2014-01-10 DIAGNOSIS — Z79899 Other long term (current) drug therapy: Secondary | ICD-10-CM | POA: Insufficient documentation

## 2014-01-10 DIAGNOSIS — F411 Generalized anxiety disorder: Secondary | ICD-10-CM | POA: Insufficient documentation

## 2014-01-10 DIAGNOSIS — Z792 Long term (current) use of antibiotics: Secondary | ICD-10-CM | POA: Insufficient documentation

## 2014-01-10 DIAGNOSIS — I10 Essential (primary) hypertension: Secondary | ICD-10-CM | POA: Insufficient documentation

## 2014-01-10 DIAGNOSIS — Z951 Presence of aortocoronary bypass graft: Secondary | ICD-10-CM | POA: Insufficient documentation

## 2014-01-10 DIAGNOSIS — Z9889 Other specified postprocedural states: Secondary | ICD-10-CM | POA: Insufficient documentation

## 2014-01-10 DIAGNOSIS — Z8669 Personal history of other diseases of the nervous system and sense organs: Secondary | ICD-10-CM | POA: Insufficient documentation

## 2014-01-10 LAB — COMPREHENSIVE METABOLIC PANEL WITH GFR
ALT: 12 U/L (ref 0–53)
AST: 20 U/L (ref 0–37)
Albumin: 3.5 g/dL (ref 3.5–5.2)
Alkaline Phosphatase: 108 U/L (ref 39–117)
BUN: 16 mg/dL (ref 6–23)
CO2: 21 meq/L (ref 19–32)
Calcium: 8.7 mg/dL (ref 8.4–10.5)
Chloride: 102 meq/L (ref 96–112)
Creatinine, Ser: 1.3 mg/dL (ref 0.50–1.35)
GFR calc Af Amer: 73 mL/min — ABNORMAL LOW (ref >90)
GFR calc non Af Amer: 63 mL/min — ABNORMAL LOW (ref >90)
Glucose, Bld: 175 mg/dL — ABNORMAL HIGH (ref 70–99)
Potassium: 3.8 meq/L (ref 3.7–5.3)
Sodium: 139 meq/L (ref 137–147)
Total Bilirubin: 0.7 mg/dL (ref 0.3–1.2)
Total Protein: 6.8 g/dL (ref 6.0–8.3)

## 2014-01-10 LAB — POCT I-STAT TROPONIN I: Troponin i, poc: 0.05 ng/mL (ref 0.00–0.08)

## 2014-01-10 MED ORDER — AZITHROMYCIN 250 MG PO TABS
500.0000 mg | ORAL_TABLET | Freq: Once | ORAL | Status: AC
  Administered 2014-01-10: 500 mg via ORAL
  Filled 2014-01-10: qty 2

## 2014-01-10 MED ORDER — SODIUM CHLORIDE 0.9 % IV SOLN
1000.0000 mL | Freq: Once | INTRAVENOUS | Status: AC
  Administered 2014-01-10: 1000 mL via INTRAVENOUS

## 2014-01-10 MED ORDER — HYDROMORPHONE HCL PF 1 MG/ML IJ SOLN
1.0000 mg | Freq: Once | INTRAMUSCULAR | Status: AC
  Administered 2014-01-10: 1 mg via INTRAVENOUS
  Filled 2014-01-10: qty 1

## 2014-01-10 MED ORDER — SODIUM CHLORIDE 0.9 % IV SOLN
1000.0000 mL | INTRAVENOUS | Status: DC
  Administered 2014-01-10: 1000 mL via INTRAVENOUS

## 2014-01-10 MED ORDER — OXYCODONE-ACETAMINOPHEN 5-325 MG PO TABS
1.0000 | ORAL_TABLET | Freq: Four times a day (QID) | ORAL | Status: AC | PRN
Start: 2014-01-10 — End: ?

## 2014-01-10 MED ORDER — AZITHROMYCIN 250 MG PO TABS: 250.0000 mg | ORAL_TABLET | Freq: Every day | ORAL | Status: AC
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
## 2014-01-10 NOTE — Progress Notes (Signed)
[facility] provided pt with a list of primary care resources, [facility] information, and a [facility] Orange Card application to help patient establish primary care.
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
## 2014-01-10 NOTE — Discharge Instructions (Signed)
As discussed, your evaluation today has resulted in a diagnosis of chest pain with pneumonia.  It is important that you follow-up with your primary care doctor.  Return here for concerning changes in your condition.

## 2014-01-10 NOTE — ED Notes (Signed)
Pt ambulating with steady gait to void in BR. Denies further needs/complaints at this time.

## 2014-01-10 NOTE — ED Notes (Signed)
Initial Contact - pt resting on stretcher, reports L ant/post CP, 8/10 currently, worse with cough, deep inspiration.  Pt reports recent dx PNA.  Pt denies associated symptoms.  Speaking full/clear sentences, rr even/un-lab.  LS congested, no cough noted.  Skin PWD.  MAEI.  NAD.  EDP aware of pain, awaiting orders.
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
## 2014-01-22 ENCOUNTER — Encounter (HOSPITAL_BASED_OUTPATIENT_CLINIC_OR_DEPARTMENT_OTHER): Payer: Self-pay | Admitting: Emergency Medicine

## 2014-01-22 ENCOUNTER — Emergency Department (HOSPITAL_BASED_OUTPATIENT_CLINIC_OR_DEPARTMENT_OTHER)
Admission: EM | Admit: 2014-01-22 | Discharge: 2014-01-22 | Disposition: A | Discharge status: Home / Self Care | Payer: No Typology Code available for payment source | Attending: Emergency Medicine | Admitting: Emergency Medicine

## 2014-01-22 DIAGNOSIS — M109 Gout, unspecified: Secondary | ICD-10-CM | POA: Insufficient documentation

## 2014-01-22 DIAGNOSIS — G589 Mononeuropathy, unspecified: Secondary | ICD-10-CM | POA: Insufficient documentation

## 2014-01-22 DIAGNOSIS — M62830 Muscle spasm of back: Secondary | ICD-10-CM

## 2014-01-22 DIAGNOSIS — Z87828 Personal history of other (healed) physical injury and trauma: Secondary | ICD-10-CM | POA: Insufficient documentation

## 2014-01-22 DIAGNOSIS — G8911 Acute pain due to trauma: Secondary | ICD-10-CM | POA: Insufficient documentation

## 2014-01-22 DIAGNOSIS — I2510 Atherosclerotic heart disease of native coronary artery without angina pectoris: Secondary | ICD-10-CM | POA: Insufficient documentation

## 2014-01-22 DIAGNOSIS — E119 Type 2 diabetes mellitus without complications: Secondary | ICD-10-CM | POA: Insufficient documentation

## 2014-01-22 DIAGNOSIS — Z87442 Personal history of urinary calculi: Secondary | ICD-10-CM | POA: Insufficient documentation

## 2014-01-22 DIAGNOSIS — Z951 Presence of aortocoronary bypass graft: Secondary | ICD-10-CM | POA: Insufficient documentation

## 2014-01-22 DIAGNOSIS — D509 Iron deficiency anemia, unspecified: Secondary | ICD-10-CM | POA: Insufficient documentation

## 2014-01-22 DIAGNOSIS — Z79899 Other long term (current) drug therapy: Secondary | ICD-10-CM | POA: Insufficient documentation

## 2014-01-22 DIAGNOSIS — I252 Old myocardial infarction: Secondary | ICD-10-CM | POA: Insufficient documentation

## 2014-01-22 DIAGNOSIS — M940 Chondrocostal junction syndrome [Tietze]: Secondary | ICD-10-CM | POA: Insufficient documentation

## 2014-01-22 DIAGNOSIS — Z87891 Personal history of nicotine dependence: Secondary | ICD-10-CM | POA: Insufficient documentation

## 2014-01-22 DIAGNOSIS — I10 Essential (primary) hypertension: Secondary | ICD-10-CM | POA: Insufficient documentation

## 2014-01-22 DIAGNOSIS — E785 Hyperlipidemia, unspecified: Secondary | ICD-10-CM | POA: Insufficient documentation

## 2014-01-22 DIAGNOSIS — F411 Generalized anxiety disorder: Secondary | ICD-10-CM | POA: Insufficient documentation

## 2014-01-22 MED ORDER — ACETAMINOPHEN 500 MG PO TABS: 500.0000 mg | ORAL_TABLET | Freq: Four times a day (QID) | ORAL | Status: AC | PRN

## 2014-01-22 MED ORDER — CYCLOBENZAPRINE HCL 10 MG PO TABS: 10.0000 mg | ORAL_TABLET | Freq: Three times a day (TID) | ORAL | Status: AC | PRN
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
## 2014-01-22 NOTE — Discharge Instructions (Signed)

## 2014-01-22 NOTE — ED Provider Notes (Signed)
Medical screening examination/treatment/procedure(s) were performed by non-physician practitioner and as supervising physician I was immediately available for consultation/collaboration.  EKG Interpretation   None         Malvin Johns, MD 01/22/14 2244
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
## 2014-11-10 ENCOUNTER — Encounter (HOSPITAL_COMMUNITY): Payer: Self-pay | Admitting: Internal Medicine
